# Patient Record
Sex: Female | Born: 1970 | Race: Black or African American | Hispanic: No | Marital: Married | Smoking: Never smoker
Health system: Southern US, Community
[De-identification: ages and names within clinical notes are randomized; demographics above are authoritative.]

## PROBLEM LIST (undated history)

## (undated) DIAGNOSIS — K219 Gastro-esophageal reflux disease without esophagitis: Secondary | ICD-10-CM

## (undated) DIAGNOSIS — D759 Disease of blood and blood-forming organs, unspecified: Secondary | ICD-10-CM

## (undated) DIAGNOSIS — J45909 Unspecified asthma, uncomplicated: Secondary | ICD-10-CM

## (undated) DIAGNOSIS — R718 Other abnormality of red blood cells: Secondary | ICD-10-CM

## (undated) DIAGNOSIS — I959 Hypotension, unspecified: Secondary | ICD-10-CM

## (undated) DIAGNOSIS — R911 Solitary pulmonary nodule: Secondary | ICD-10-CM

## (undated) DIAGNOSIS — K8020 Calculus of gallbladder without cholecystitis without obstruction: Secondary | ICD-10-CM

## (undated) DIAGNOSIS — E669 Obesity, unspecified: Secondary | ICD-10-CM

## (undated) DIAGNOSIS — N83202 Unspecified ovarian cyst, left side: Secondary | ICD-10-CM

## (undated) DIAGNOSIS — G43829 Menstrual migraine, not intractable, without status migrainosus: Secondary | ICD-10-CM

## (undated) DIAGNOSIS — J40 Bronchitis, not specified as acute or chronic: Secondary | ICD-10-CM

## (undated) DIAGNOSIS — E66812 Obesity, class 2: Secondary | ICD-10-CM

## (undated) DIAGNOSIS — I2542 Coronary artery dissection: Secondary | ICD-10-CM

## (undated) DIAGNOSIS — I219 Acute myocardial infarction, unspecified: Secondary | ICD-10-CM

## (undated) DIAGNOSIS — Z8619 Personal history of other infectious and parasitic diseases: Secondary | ICD-10-CM

## (undated) DIAGNOSIS — N943 Premenstrual tension syndrome: Secondary | ICD-10-CM

## (undated) DIAGNOSIS — B373 Candidiasis of vulva and vagina: Secondary | ICD-10-CM

## (undated) DIAGNOSIS — D649 Anemia, unspecified: Secondary | ICD-10-CM

## (undated) DIAGNOSIS — I2089 Other forms of angina pectoris: Secondary | ICD-10-CM

## (undated) DIAGNOSIS — I255 Ischemic cardiomyopathy: Secondary | ICD-10-CM

## (undated) DIAGNOSIS — F419 Anxiety disorder, unspecified: Secondary | ICD-10-CM

## (undated) DIAGNOSIS — M5020 Other cervical disc displacement, unspecified cervical region: Secondary | ICD-10-CM

## (undated) DIAGNOSIS — I493 Ventricular premature depolarization: Secondary | ICD-10-CM

## (undated) DIAGNOSIS — Z973 Presence of spectacles and contact lenses: Secondary | ICD-10-CM

## (undated) DIAGNOSIS — G709 Myoneural disorder, unspecified: Secondary | ICD-10-CM

## (undated) HISTORY — DX: Obesity, class 2: E66.812

## (undated) HISTORY — DX: Obesity, unspecified: E66.9

## (undated) HISTORY — DX: Premenstrual tension syndrome: N94.3

## (undated) HISTORY — DX: Candidiasis of vulva and vagina: B37.3

## (undated) HISTORY — DX: Unspecified ovarian cyst, left side: N83.202

## (undated) HISTORY — DX: Calculus of gallbladder without cholecystitis without obstruction: K80.20

## (undated) HISTORY — DX: Ischemic cardiomyopathy: I25.5

## (undated) HISTORY — DX: Hypotension, unspecified: I95.9

## (undated) HISTORY — PX: WISDOM TOOTH EXTRACTION: SHX21

## (undated) HISTORY — DX: Menstrual migraine, not intractable, without status migrainosus: G43.829

## (undated) HISTORY — DX: Other cervical disc displacement, unspecified cervical region: M50.20

## (undated) HISTORY — DX: Gastro-esophageal reflux disease without esophagitis: K21.9

## (undated) HISTORY — DX: Myoneural disorder, unspecified: G70.9

## (undated) HISTORY — DX: Other abnormality of red blood cells: R71.8

## (undated) HISTORY — DX: Personal history of other infectious and parasitic diseases: Z86.19

## (undated) HISTORY — DX: Ventricular premature depolarization: I49.3

## (undated) HISTORY — DX: Unspecified asthma, uncomplicated: J45.909

## (undated) HISTORY — DX: Anxiety disorder, unspecified: F41.9

---

## 1998-07-19 ENCOUNTER — Inpatient Hospital Stay (HOSPITAL_COMMUNITY): Admission: AD | Admit: 1998-07-19 | Discharge: 1998-07-21 | Payer: Self-pay | Admitting: Obstetrics and Gynecology

## 1998-09-13 ENCOUNTER — Other Ambulatory Visit: Admission: RE | Admit: 1998-09-13 | Discharge: 1998-09-13 | Payer: Self-pay | Admitting: Obstetrics and Gynecology

## 1999-10-16 ENCOUNTER — Other Ambulatory Visit: Admission: RE | Admit: 1999-10-16 | Discharge: 1999-10-16 | Payer: Self-pay | Admitting: Obstetrics and Gynecology

## 2000-11-09 ENCOUNTER — Other Ambulatory Visit: Admission: RE | Admit: 2000-11-09 | Discharge: 2000-11-09 | Payer: Self-pay | Admitting: Obstetrics and Gynecology

## 2001-12-16 ENCOUNTER — Other Ambulatory Visit: Admission: RE | Admit: 2001-12-16 | Discharge: 2001-12-16 | Payer: Self-pay | Admitting: Obstetrics and Gynecology

## 2003-01-09 ENCOUNTER — Other Ambulatory Visit: Admission: RE | Admit: 2003-01-09 | Discharge: 2003-01-09 | Payer: Self-pay | Admitting: Obstetrics and Gynecology

## 2003-02-17 DIAGNOSIS — B373 Candidiasis of vulva and vagina: Secondary | ICD-10-CM

## 2003-02-17 DIAGNOSIS — B3731 Acute candidiasis of vulva and vagina: Secondary | ICD-10-CM

## 2003-02-17 HISTORY — DX: Candidiasis of vulva and vagina: B37.3

## 2003-02-17 HISTORY — DX: Acute candidiasis of vulva and vagina: B37.31

## 2004-01-09 ENCOUNTER — Other Ambulatory Visit: Admission: RE | Admit: 2004-01-09 | Discharge: 2004-01-09 | Payer: Self-pay | Admitting: Obstetrics and Gynecology

## 2004-01-14 ENCOUNTER — Other Ambulatory Visit: Admission: RE | Admit: 2004-01-14 | Discharge: 2004-01-14 | Payer: Self-pay | Admitting: Obstetrics and Gynecology

## 2005-03-03 ENCOUNTER — Other Ambulatory Visit: Admission: RE | Admit: 2005-03-03 | Discharge: 2005-03-03 | Payer: Self-pay | Admitting: Obstetrics and Gynecology

## 2010-02-16 DIAGNOSIS — N83202 Unspecified ovarian cyst, left side: Secondary | ICD-10-CM

## 2010-02-16 HISTORY — DX: Unspecified ovarian cyst, left side: N83.202

## 2011-01-28 ENCOUNTER — Encounter: Payer: Self-pay | Admitting: Physician Assistant

## 2011-02-02 ENCOUNTER — Other Ambulatory Visit: Payer: Self-pay | Admitting: Obstetrics and Gynecology

## 2011-02-04 ENCOUNTER — Encounter (INDEPENDENT_AMBULATORY_CARE_PROVIDER_SITE_OTHER): Payer: BC Managed Care – PPO | Admitting: Physician Assistant

## 2011-02-04 DIAGNOSIS — Z111 Encounter for screening for respiratory tuberculosis: Secondary | ICD-10-CM

## 2011-02-04 DIAGNOSIS — Z0000 Encounter for general adult medical examination without abnormal findings: Secondary | ICD-10-CM

## 2011-02-04 DIAGNOSIS — Z23 Encounter for immunization: Secondary | ICD-10-CM

## 2011-02-05 ENCOUNTER — Encounter (HOSPITAL_COMMUNITY): Payer: Self-pay | Admitting: Pharmacist

## 2011-02-09 NOTE — H&P (Signed)
NAME:  [name], [name]                   ACCOUNT NO.:  MEDICAL RECORD NO.:  [id number]  LOCATION:                                 FACILITY:  PHYSICIAN:  Dois Davenport A. Rivard, M.D. DATE OF BIRTH:  1970-08-08  DATE OF ADMISSION: DATE OF DISCHARGE:                             HISTORY & PHYSICAL   HISTORY OF PRESENT ILLNESS:  [name] is a 40 year old, married black female, para 2-0-1-2 presenting for a robot-assisted left ovarian cystectomy and bilateral laparoscopic tubal cautery because of a complex  left ovarian cyst and a desire for sterilization.   During an evaluation for pelvic pain in December 2012, the patient was  found on ultrasound to have a septated left ovarian cyst measuring 7.0 x 4.6 x 5.9 cm along with the uterus measuring 9.88 x 4.6 x 4.7 cm with normal ovaries.  The patient's CEA, CA-125 and comprehensive metabolic panel were all normal.  Two weeks prior to being seen, the patient reported that she'd had an alternating dull, sharp and crampy lower back pain that did not diminish with over-the-counter analgesia. Over time this  pain began to encompass her entire pelvis and she rated it as a 10/10 on a 10-point pain scale. Finally, with Ibuprofen 800 mg she was able to decrease her pain to almost 0.  For the past several days, the patient's  Intense pain localized to her left lower quadrant, though she continued to have discomfort in the remainder of her pelvis and her lower back.  The patient has monthly menstrual periods requiring a panty liner during her 4-day flow and only experiences minimal cramping.  Since this current "pelvic pain" episode, she has had some pain with intercourse along with discomfort after urination, particularly when her bladder is overfilled.  She has also noticed constipation that has required her to use a laxative every 5 days since she has had this pelvic issue.  She denies any vaginitis symptoms, indigestion, hematuria, or fever.  Additionally  the patient desires to pursue permanent sterilization as she has completed her childbearing.  She used oral contraceptives in the past and currently uses a Mirena IUD for both contraception and menstrual flow management.  Given the findings on ultrasound, her pelvic discomfort and her desire for permanent sterilization, the patient has decided to proceed with robot-assisted left ovarian cystectomy and laparoscopic tubal cautery.  PAST MEDICAL HISTORY:  OB History:  Gravida 3, para 2-0-1-2.  The patient had 2 spontaneous vaginal deliveries.  GYN history:  Menarche 40 years old.  The patient has very scant menstrual period due to the Mirena IUD.  Denies any history of sexually transmitted diseases or abnormal Pap smears.  Last normal Pap smear was June 2012.  Medical History:  Menstrual migraines, PMS, acid reflux disease.  Surgical History:  Negative.  FAMILY HISTORY:  Diabetes, hypertension.  SOCIAL HISTORY:  The patient is married and she works as a Child psychotherapist.  HABITS:  She denies any alcohol, tobacco, or illicit drug use.  CURRENT MEDICATIONS: 1. Multivitamins daily. 2. Ibuprofen 600 mg every 6 hours with food as needed. 3. Zantac and MiraLAX as needed.  The patient had a flu shot  and tetanus shot in February 04, 2011.  She has no known drug allergies.  Denies any sensitivities to latex, peanuts, shellfish, or soy.  REVIEW OF SYSTEMS:  The patient wears contact lenses.  She Occasionally has heartburn and headaches with her migraines.  She  denies any chest pain, shortness of breath, vision changes, dysphagia, vomiting, diarrhea, myalgias, or arthralgias and except as is mentioned in history of present illness her review of systems is negative.  PHYSICAL EXAMINATION:  VITAL SIGNS:  Blood pressure 112/72, pulse is 80, respirations 16, temperature 98.1 degrees Fahrenheit orally, weight 149 pounds, height 5 feet10 inches tall. NECK:  Supple without masses.  There is no  thyromegaly or cervical adenopathy. HEART:  Regular rate and rhythm. LUNGS:  Clear. BACK:  No CVA tenderness. ABDOMEN:  Soft without any palpable masses.  There is left lower quadrant greater than right lower quadrant tenderness without guarding or rebound or organomegaly. EXTREMITIES:  No clubbing, cyanosis, or edema. PELVIC:  EGBUS is normal.  Vagina is normal.  Cervix is nontender without lesions.  Uterus appears normal size, shape, and consistency without tenderness.  Adnexa with an enlarged left ovary palpable in the anterior cul-de-sac that is mobile and smooth.  IMPRESSION: 1. Left ovarian cyst. 2. Desire for sterilization.  DISPOSITION:  A discussion was held with the patient regarding indications for her procedure along with its risks which include but are not limited to reaction to anesthesia, damage to adjacent organs, infection, excessive bleeding, and the possibility of oophorectomy.  The patient also was advised that laparoscopic tubal cautery has a failure rate of 1 per 500.  The patient was further advised that she will experience transient postoperative facial edema and that 24 hours prior to her procedure she should perform the MiraLAX bowel prep instructions that were given to her.  The patient verbalized understanding of these risks and instructions and has consented to proceed with a robot- assisted ovarian cystectomy with laparoscopic tubal cautery at [facility] [place] on February 19, 2010, at 1:30 p.m.    Orlan Aversa J. Lowell Guitar, P.A.-C   ______________________________ Crist Fat Rivard, M.D.   EJP/MEDQ  D:  02/06/2011  T:  02/07/2011  Job:  [id number]

## 2011-02-12 NOTE — Patient Instructions (Addendum)
   Your procedure is scheduled on: Friday 1/4 at 1:30pm  Enter through the Main Entrance of [facility] at: 12:00pm Pick up the phone at the desk and dial [phone] and inform us of your arrival.  Please call this number if you have any problems the morning of surgery: [phone]  Remember: Do not eat food after midnight: Thursday  Do not drink clear liquids after: 8:00 am on Friday  Do not wear jewelry, make-up, or FINGER nail polish Do not wear lotions, powders, perfumes or deodorant. Do not shave 48 hours prior to surgery. Do not bring valuables to the hospital.  Remember to use your hibiclens as instructed.Please shower with 1/2 bottle the evening before your surgery and the other 1/2 bottle the morning of surgery.

## 2011-02-13 ENCOUNTER — Encounter (HOSPITAL_COMMUNITY)
Admission: RE | Admit: 2011-02-13 | Discharge: 2011-02-13 | Disposition: A | Discharge status: Home / Self Care | Payer: BC Managed Care – PPO | Source: Ambulatory Visit | Attending: Obstetrics and Gynecology | Admitting: Obstetrics and Gynecology

## 2011-02-13 ENCOUNTER — Encounter (HOSPITAL_COMMUNITY): Payer: Self-pay

## 2011-02-13 LAB — CBC
Hemoglobin: 14.4 g/dL (ref 12.0–15.0)
MCH: 27 pg (ref 26.0–34.0)
Platelets: 254 10*3/uL (ref 150–400)
RBC: 5.33 MIL/uL — ABNORMAL HIGH (ref 3.87–5.11)
WBC: 5.8 10*3/uL (ref 4.0–10.5)

## 2011-02-13 LAB — SURGICAL PCR SCREEN: MRSA, PCR: NEGATIVE

## 2011-02-17 HISTORY — PX: ROBOTIC ASSISTED LAPAROSCOPIC OVARIAN CYSTECTOMY: SHX6081

## 2011-02-19 MED ORDER — DEXTROSE 5 % IV SOLN
1.0000 g | INTRAVENOUS | Status: AC
  Administered 2011-02-20: 1 g via INTRAVENOUS
  Filled 2011-02-19: qty 1

## 2011-02-20 ENCOUNTER — Other Ambulatory Visit: Payer: Self-pay | Admitting: Obstetrics and Gynecology

## 2011-02-20 ENCOUNTER — Encounter (HOSPITAL_COMMUNITY): Payer: Self-pay | Admitting: *Deleted

## 2011-02-20 ENCOUNTER — Ambulatory Visit (HOSPITAL_COMMUNITY)
Admission: RE | Admit: 2011-02-20 | Discharge: 2011-02-20 | Disposition: A | Discharge status: Home / Self Care | Payer: BC Managed Care – PPO | Source: Ambulatory Visit | Attending: Obstetrics and Gynecology | Admitting: Obstetrics and Gynecology

## 2011-02-20 ENCOUNTER — Encounter (HOSPITAL_COMMUNITY)
Admission: RE | Disposition: A | Discharge status: Home / Self Care | Payer: Self-pay | Source: Ambulatory Visit | Attending: Obstetrics and Gynecology

## 2011-02-20 ENCOUNTER — Ambulatory Visit (HOSPITAL_COMMUNITY): Payer: BC Managed Care – PPO | Admitting: Anesthesiology

## 2011-02-20 ENCOUNTER — Encounter (HOSPITAL_COMMUNITY): Payer: Self-pay | Admitting: Anesthesiology

## 2011-02-20 DIAGNOSIS — Z01818 Encounter for other preprocedural examination: Secondary | ICD-10-CM | POA: Insufficient documentation

## 2011-02-20 DIAGNOSIS — N83202 Unspecified ovarian cyst, left side: Secondary | ICD-10-CM

## 2011-02-20 DIAGNOSIS — Z302 Encounter for sterilization: Secondary | ICD-10-CM | POA: Insufficient documentation

## 2011-02-20 DIAGNOSIS — Z01812 Encounter for preprocedural laboratory examination: Secondary | ICD-10-CM | POA: Insufficient documentation

## 2011-02-20 DIAGNOSIS — N8300 Follicular cyst of ovary, unspecified side: Secondary | ICD-10-CM | POA: Insufficient documentation

## 2011-02-20 DIAGNOSIS — Z30432 Encounter for removal of intrauterine contraceptive device: Secondary | ICD-10-CM | POA: Insufficient documentation

## 2011-02-20 HISTORY — PX: TUBAL LIGATION: SHX77

## 2011-02-20 HISTORY — PX: LAPAROSCOPIC TUBAL LIGATION: SHX1937

## 2011-02-20 LAB — PREGNANCY, URINE: Preg Test, Ur: NEGATIVE

## 2011-02-20 SURGERY — ROBOTIC ASSISTED LAPAROSCOPIC OVARIAN CYSTECTOMY
Anesthesia: General | Site: Abdomen | Laterality: Left | Wound class: Clean Contaminated

## 2011-02-20 MED ORDER — KETOROLAC TROMETHAMINE 30 MG/ML IJ SOLN
INTRAMUSCULAR | Status: AC
  Filled 2011-02-20: qty 1

## 2011-02-20 MED ORDER — HEPARIN SODIUM (PORCINE) 5000 UNIT/ML IJ SOLN
INTRAMUSCULAR | Status: DC | PRN
  Administered 2011-02-20: 5000 [IU]

## 2011-02-20 MED ORDER — OXYCODONE-ACETAMINOPHEN 5-325 MG PO TABS
1.0000 | ORAL_TABLET | ORAL | Status: DC | PRN
  Administered 2011-02-20: 1 via ORAL

## 2011-02-20 MED ORDER — FENTANYL CITRATE 0.05 MG/ML IJ SOLN
INTRAMUSCULAR | Status: DC | PRN
  Administered 2011-02-20 (×4): 50 ug via INTRAVENOUS

## 2011-02-20 MED ORDER — KETOROLAC TROMETHAMINE 30 MG/ML IJ SOLN
INTRAMUSCULAR | Status: DC | PRN
  Administered 2011-02-20: 30 mg via INTRAVENOUS
  Administered 2011-02-20: 30 mg via INTRAMUSCULAR

## 2011-02-20 MED ORDER — ROCURONIUM BROMIDE 100 MG/10ML IV SOLN
INTRAVENOUS | Status: DC | PRN
  Administered 2011-02-20: 50 mg via INTRAVENOUS

## 2011-02-20 MED ORDER — GLYCOPYRROLATE 0.2 MG/ML IJ SOLN
INTRAMUSCULAR | Status: AC
  Filled 2011-02-20: qty 1

## 2011-02-20 MED ORDER — BUPIVACAINE HCL (PF) 0.25 % IJ SOLN
INTRAMUSCULAR | Status: DC | PRN
  Administered 2011-02-20: 13 mL

## 2011-02-20 MED ORDER — ROCURONIUM BROMIDE 50 MG/5ML IV SOLN
INTRAVENOUS | Status: AC
  Filled 2011-02-20: qty 1

## 2011-02-20 MED ORDER — FENTANYL CITRATE 0.05 MG/ML IJ SOLN
INTRAMUSCULAR | Status: AC
  Administered 2011-02-20: 25 ug via INTRAVENOUS
  Filled 2011-02-20: qty 2

## 2011-02-20 MED ORDER — OXYCODONE-ACETAMINOPHEN 5-325 MG PO TABS
ORAL_TABLET | ORAL | Status: AC
  Filled 2011-02-20: qty 1

## 2011-02-20 MED ORDER — PROPOFOL 10 MG/ML IV EMUL
INTRAVENOUS | Status: AC
  Filled 2011-02-20: qty 20

## 2011-02-20 MED ORDER — KETOROLAC TROMETHAMINE 60 MG/2ML IM SOLN
INTRAMUSCULAR | Status: AC
  Filled 2011-02-20: qty 2

## 2011-02-20 MED ORDER — GLYCOPYRROLATE 0.2 MG/ML IJ SOLN
INTRAMUSCULAR | Status: DC | PRN
  Administered 2011-02-20: .4 mg via INTRAVENOUS

## 2011-02-20 MED ORDER — PROPOFOL 10 MG/ML IV EMUL
INTRAVENOUS | Status: DC | PRN
  Administered 2011-02-20: 160 mg via INTRAVENOUS

## 2011-02-20 MED ORDER — ONDANSETRON HCL 4 MG/2ML IJ SOLN
INTRAMUSCULAR | Status: DC | PRN
  Administered 2011-02-20: 4 mg via INTRAVENOUS

## 2011-02-20 MED ORDER — DEXAMETHASONE SODIUM PHOSPHATE 4 MG/ML IJ SOLN
INTRAMUSCULAR | Status: DC | PRN
  Administered 2011-02-20: 5 mg via INTRAVENOUS

## 2011-02-20 MED ORDER — LIDOCAINE HCL (CARDIAC) 20 MG/ML IV SOLN
INTRAVENOUS | Status: DC | PRN
  Administered 2011-02-20: 80 mg via INTRAVENOUS

## 2011-02-20 MED ORDER — NEOSTIGMINE METHYLSULFATE 1 MG/ML IJ SOLN
INTRAMUSCULAR | Status: DC | PRN
  Administered 2011-02-20: 3 mg via INTRAVENOUS

## 2011-02-20 MED ORDER — FENTANYL CITRATE 0.05 MG/ML IJ SOLN
25.0000 ug | INTRAMUSCULAR | Status: DC | PRN
  Administered 2011-02-20: 25 ug via INTRAVENOUS

## 2011-02-20 MED ORDER — OXYCODONE-ACETAMINOPHEN 5-325 MG PO TABS: 1.0000 | ORAL_TABLET | Freq: Four times a day (QID) | ORAL | Status: DC | PRN

## 2011-02-20 MED ORDER — PROMETHAZINE HCL 12.5 MG PO TABS: 12.5000 mg | ORAL_TABLET | Freq: Four times a day (QID) | ORAL | Status: AC | PRN

## 2011-02-20 MED ORDER — NEOSTIGMINE METHYLSULFATE 1 MG/ML IJ SOLN
INTRAMUSCULAR | Status: AC
  Filled 2011-02-20: qty 10

## 2011-02-20 MED ORDER — ONDANSETRON HCL 4 MG/2ML IJ SOLN
INTRAMUSCULAR | Status: AC
  Filled 2011-02-20: qty 2

## 2011-02-20 MED ORDER — LIDOCAINE HCL (CARDIAC) 20 MG/ML IV SOLN
INTRAVENOUS | Status: AC
  Filled 2011-02-20: qty 5

## 2011-02-20 MED ORDER — LACTATED RINGERS IR SOLN
Status: DC | PRN
  Administered 2011-02-20: 3000 mL

## 2011-02-20 MED ORDER — MIDAZOLAM HCL 2 MG/2ML IJ SOLN
INTRAMUSCULAR | Status: AC
  Filled 2011-02-20: qty 2

## 2011-02-20 MED ORDER — MIDAZOLAM HCL 5 MG/5ML IJ SOLN
INTRAMUSCULAR | Status: DC | PRN
  Administered 2011-02-20: 2 mg via INTRAVENOUS

## 2011-02-20 MED ORDER — LACTATED RINGERS IV SOLN
INTRAVENOUS | Status: DC
  Administered 2011-02-20: 13:00:00 via INTRAVENOUS

## 2011-02-20 MED ORDER — KETOROLAC TROMETHAMINE 30 MG/ML IJ SOLN: 15.0000 mg | Freq: Once | INTRAMUSCULAR | Status: DC | PRN

## 2011-02-20 MED ORDER — IBUPROFEN 600 MG PO TABS: 600.0000 mg | ORAL_TABLET | Freq: Four times a day (QID) | ORAL | Status: AC

## 2011-02-20 MED ORDER — DEXAMETHASONE SODIUM PHOSPHATE 10 MG/ML IJ SOLN
INTRAMUSCULAR | Status: AC
  Filled 2011-02-20: qty 1

## 2011-02-20 MED ORDER — FENTANYL CITRATE 0.05 MG/ML IJ SOLN
INTRAMUSCULAR | Status: AC
  Filled 2011-02-20: qty 5

## 2011-02-20 SURGICAL SUPPLY — 81 items
ADH SKN CLS APL DERMABOND .7 (GAUZE/BANDAGES/DRESSINGS) ×2
APL SKNCLS STERI-STRIP NONHPOA (GAUZE/BANDAGES/DRESSINGS) ×2
BAG URINE DRAINAGE (UROLOGICAL SUPPLIES) ×5 IMPLANT
BARRIER ADHS 3X4 INTERCEED (GAUZE/BANDAGES/DRESSINGS) ×3 IMPLANT
BENZOIN TINCTURE PRP APPL 2/3 (GAUZE/BANDAGES/DRESSINGS) ×3 IMPLANT
BLADELESS LONG 8MM (BLADE) ×3 IMPLANT
BRR ADH 4X3 ABS CNTRL BYND (GAUZE/BANDAGES/DRESSINGS) ×2
CABLE HIGH FREQUENCY MONO STRZ (ELECTRODE) ×3 IMPLANT
CATH FOLEY 3WAY  5CC 16FR (CATHETERS) ×1
CATH FOLEY 3WAY  5CC 18FR (CATHETERS) ×1
CATH FOLEY 3WAY 5CC 16FR (CATHETERS) ×4 IMPLANT
CATH FOLEY 3WAY 5CC 18FR (CATHETERS) ×2 IMPLANT
CATH ROBINSON RED A/P 16FR (CATHETERS) ×3 IMPLANT
CHLORAPREP W/TINT 26ML (MISCELLANEOUS) ×3 IMPLANT
CLOTH BEACON ORANGE TIMEOUT ST (SAFETY) ×3 IMPLANT
CONT PATH 16OZ SNAP LID 3702 (MISCELLANEOUS) ×3 IMPLANT
CORDS BIPOLAR (ELECTRODE) IMPLANT
COVER MAYO STAND STRL (DRAPES) ×3 IMPLANT
COVER TABLE BACK 60X90 (DRAPES) ×6 IMPLANT
COVER TIP SHEARS 8 DVNC (MISCELLANEOUS) ×2 IMPLANT
COVER TIP SHEARS 8MM DA VINCI (MISCELLANEOUS) ×1
DECANTER SPIKE VIAL GLASS SM (MISCELLANEOUS) ×3 IMPLANT
DERMABOND ADVANCED (GAUZE/BANDAGES/DRESSINGS) ×1
DERMABOND ADVANCED .7 DNX12 (GAUZE/BANDAGES/DRESSINGS) ×2 IMPLANT
DRAPE HUG U DISPOSABLE (DRAPE) ×3 IMPLANT
DRAPE LG THREE QUARTER DISP (DRAPES) ×6 IMPLANT
DRAPE MONITOR DA VINCI (DRAPE) ×1 IMPLANT
DRAPE WARM FLUID 44X44 (DRAPE) ×3 IMPLANT
ELECT REM PT RETURN 9FT ADLT (ELECTROSURGICAL) ×3
ELECTRODE REM PT RTRN 9FT ADLT (ELECTROSURGICAL) ×2 IMPLANT
EVACUATOR SMOKE 8.L (FILTER) ×3 IMPLANT
GAUZE VASELINE 3X9 (GAUZE/BANDAGES/DRESSINGS) IMPLANT
GLOVE BIOGEL PI IND STRL 7.0 (GLOVE) ×6 IMPLANT
GLOVE BIOGEL PI INDICATOR 7.0 (GLOVE) ×3
GLOVE ECLIPSE 6.5 STRL STRAW (GLOVE) ×9 IMPLANT
GOWN PREVENTION PLUS LG XLONG (DISPOSABLE) ×16 IMPLANT
GRASPER BIPOLAR FEN DA VINCI (INSTRUMENTS)
GRASPER BPLR FEN DVNC (INSTRUMENTS) IMPLANT
KIT ACCESSORY DA VINCI DISP (KITS) ×1
KIT ACCESSORY DVNC DISP (KITS) ×2 IMPLANT
KIT DISP ACCESSORY 4 ARM (KITS) ×1 IMPLANT
MANIPULATOR UTERINE 4.5 ZUMI (MISCELLANEOUS) ×1 IMPLANT
NDL SPNL 22GX7 QUINCKE BK (NEEDLE) IMPLANT
NEEDLE SPNL 22GX7 QUINCKE BK (NEEDLE) IMPLANT
NS IRRIG 1000ML POUR BTL (IV SOLUTION) ×9 IMPLANT
OCCLUDER COLPOPNEUMO (BALLOONS) IMPLANT
PACK LAPAROSCOPY BASIN (CUSTOM PROCEDURE TRAY) ×2 IMPLANT
PACK LAVH (CUSTOM PROCEDURE TRAY) ×3 IMPLANT
PAD PREP 24X48 CUFFED NSTRL (MISCELLANEOUS) ×6 IMPLANT
PLUG CATH AND CAP STER (CATHETERS) ×3 IMPLANT
POSITIONER SURGICAL ARM (MISCELLANEOUS) ×6 IMPLANT
SET CYSTO W/LG BORE CLAMP LF (SET/KITS/TRAYS/PACK) IMPLANT
SET IRRIG TUBING LAPAROSCOPIC (IRRIGATION / IRRIGATOR) ×3 IMPLANT
SLEEVE SURGEON STRL (DRAPES) ×1 IMPLANT
SOLUTION ELECTROLUBE (MISCELLANEOUS) ×3 IMPLANT
SPONGE LAP 18X18 X RAY DECT (DISPOSABLE) IMPLANT
STRIP CLOSURE SKIN 1/2X4 (GAUZE/BANDAGES/DRESSINGS) ×2 IMPLANT
SUT MNCRL AB 3-0 PS2 27 (SUTURE) ×3 IMPLANT
SUT VIC AB 0 CT1 27 (SUTURE) ×18
SUT VIC AB 0 CT1 27XBRD ANTBC (SUTURE) ×12 IMPLANT
SUT VIC AB 0 CT2 27 (SUTURE) IMPLANT
SUT VIC AB 2-0 CT2 27 (SUTURE) ×3 IMPLANT
SUT VIC AB 3-0 SH 27 (SUTURE) ×3
SUT VIC AB 3-0 SH 27X BRD (SUTURE) ×2 IMPLANT
SUT VICRYL 0 UR6 27IN ABS (SUTURE) ×6 IMPLANT
SYR 50ML LL SCALE MARK (SYRINGE) ×3 IMPLANT
SYSTEM CONVERTIBLE TROCAR (TROCAR) ×3 IMPLANT
TIP UTERINE 5.1X6CM LAV DISP (MISCELLANEOUS) IMPLANT
TIP UTERINE 6.7X10CM GRN DISP (MISCELLANEOUS) IMPLANT
TIP UTERINE 6.7X6CM WHT DISP (MISCELLANEOUS) IMPLANT
TIP UTERINE 6.7X8CM BLUE DISP (MISCELLANEOUS) IMPLANT
TOWEL OR 17X24 6PK STRL BLUE (TOWEL DISPOSABLE) ×9 IMPLANT
TROCAR 12M 150ML BLUNT (TROCAR) ×2 IMPLANT
TROCAR BALLN 12MMX100 BLUNT (TROCAR) ×1 IMPLANT
TROCAR DISP BLADELESS 8 DVNC (TROCAR) ×2 IMPLANT
TROCAR DISP BLADELESS 8MM (TROCAR)
TROCAR XCEL 12X100 BLDLESS (ENDOMECHANICALS) ×2 IMPLANT
TROCAR Z-THREAD BLADED 12X100M (TROCAR) IMPLANT
TUBING FILTER THERMOFLATOR (ELECTROSURGICAL) ×3 IMPLANT
WARMER LAPAROSCOPE (MISCELLANEOUS) ×3 IMPLANT
WATER STERILE IRR 1000ML POUR (IV SOLUTION) ×7 IMPLANT

## 2011-02-20 NOTE — Brief Op Note (Signed)
02/20/2011  4:13 PM  PATIENT:  [name]  41 y.o. female  PRE-OPERATIVE DIAGNOSIS:  Left Ovarian Cyst; Desire for Sterilization  POST-OPERATIVE DIAGNOSIS:  Left Ovarian Cyst; Desire for Sterilization, Pelvic adhesions    Procedure(s): ROBOTIC ASSISTED LAPAROSCOPIC OVARIAN CYSTECTOMY LAPAROSCOPIC TUBAL LIGATION    Surgeon(s): Esmeralda Arthur, MD   ASSISTANTS: Henreitta Leber PA-C   ANESTHESIA:   local and general  LOCAL MEDICATIONS USED:  MARCAINE 10 CC  SPECIMEN:  ovarian cyst walls and pelvic washings  DISPOSITION OF SPECIMEN:  PATHOLOGY  COUNTS:  YES  ESTIMATED BLOOD LOSS: * No blood loss amount entered *   PATIENT DISPOSITION:  PACU - hemodynamically stable.   DICTATION #: [id number]    Silverio Lay MD 02/20/2011 4:13 PM

## 2011-02-20 NOTE — Transfer of Care (Signed)
Immediate Anesthesia Transfer of Care Note  [name]  Procedure(s) Performed:  ROBOTIC ASSISTED LAPAROSCOPIC OVARIAN CYSTECTOMY; LAPAROSCOPIC TUBAL LIGATION - with Removal of Mirena and Robotic Assisted Bilateral Tubal Ligation  Patient Location: PACU  Anesthesia Type: General  Level of Consciousness: awake, alert , oriented and patient cooperative  Airway & Oxygen Therapy: Patient Spontanous Breathing and Patient connected to nasal cannula oxygen  Post-op Assessment: Report given to PACU RN  Post vital signs: stable  Complications: No apparent anesthesia complications

## 2011-02-20 NOTE — Anesthesia Procedure Notes (Signed)
Procedures

## 2011-02-20 NOTE — Anesthesia Preprocedure Evaluation (Signed)
Anesthesia Evaluation  Patient identified by MRN, date of birth, ID band Patient awake    Reviewed: Allergy & Precautions, H&P , NPO status , Patient's Chart, lab work & pertinent test results, reviewed documented beta blocker date and time   History of Anesthesia Complications Negative for: history of anesthetic complications  Airway Mallampati: I TM Distance: >3 FB Neck ROM: full    Dental  (+) Teeth Intact   Pulmonary neg pulmonary ROS,  clear to auscultation  Pulmonary exam normal       Cardiovascular Exercise Tolerance: Good neg cardio ROS regular Normal    Neuro/Psych Negative Neurological ROS  Negative Psych ROS   GI/Hepatic negative GI ROS, Neg liver ROS,   Endo/Other  Negative Endocrine ROS  Renal/GU negative Renal ROS  Genitourinary negative   Musculoskeletal   Abdominal   Peds  Hematology negative hematology ROS (+)   Anesthesia Other Findings   Reproductive/Obstetrics negative OB ROS                           Anesthesia Physical Anesthesia Plan  ASA: I  Anesthesia Plan: General ETT   Post-op Pain Management:    Induction:   Airway Management Planned:   Additional Equipment:   Intra-op Plan:   Post-operative Plan:   Informed Consent: I have reviewed the patients History and Physical, chart, labs and discussed the procedure including the risks, benefits and alternatives for the proposed anesthesia with the patient or authorized representative who has indicated his/her understanding and acceptance.   Dental Advisory Given  Plan Discussed with: CRNA and Surgeon  Anesthesia Plan Comments:         Anesthesia Quick Evaluation

## 2011-02-20 NOTE — Anesthesia Postprocedure Evaluation (Signed)
Anesthesia Post Note  [name]  Procedure(s) Performed:  ROBOTIC ASSISTED LAPAROSCOPIC OVARIAN CYSTECTOMY; LAPAROSCOPIC TUBAL LIGATION - with Removal of Mirena and Robotic Assisted Bilateral Tubal Ligation  Anesthesia type: General  Patient location: PACU  Post pain: Pain level controlled  Post assessment: Post-op Vital signs reviewed  Last Vitals:  Filed Vitals:   02/20/11 1730  BP: 112/57  Pulse: 80  Temp: 37.1 C  Resp: 16    Post vital signs: Reviewed  Level of consciousness: sedated  Complications: No apparent anesthesia complications

## 2011-02-20 NOTE — Interval H&P Note (Signed)
History and Physical Interval Note:  02/20/2011 1:17 PM  [name]  has presented today for surgery, with the diagnosis of Left Ovarian Cyst; Desire for Sterilization  The various methods of treatment have been discussed with the patient and family. After consideration of risks, benefits and other options for treatment, the patient has consented to  Procedure(s): ROBOTIC ASSISTED LAPAROSCOPIC OVARIAN CYSTECTOMY LAPAROSCOPIC TUBAL LIGATION as a surgical intervention .  The patients' history has been reviewed, patient examined, no change in status, stable for surgery.  I have reviewed the patients' chart and labs.  Questions were answered to the patient's satisfaction.     Darion Milewski A

## 2011-02-20 NOTE — Brief Op Note (Deleted)
02/20/2011  3:54 PM  PATIENT:  [name]  41 y.o. female  PRE-OPERATIVE DIAGNOSIS:  Left Ovarian Cyst; Desire for Sterilization  POST-OPERATIVE DIAGNOSIS:  Left Ovarian Cyst; Desire for Sterilization    Procedure(s): ROBOTIC ASSISTED LAPAROSCOPIC OVARIAN CYSTECTOMY LAPAROSCOPIC TUBAL LIGATION    Surgeon(s): Esmeralda Arthur, MD MD  ASSISTANTS: Henreitta Leber PA-C   ANESTHESIA:   local and general  LOCAL MEDICATIONS USED:  MARCAINE 20 CC  SPECIMEN:  Left ovarian cyst wall x 2  DISPOSITION OF SPECIMEN:  PATHOLOGY  COUNTS:  YES  ESTIMATED BLOOD LOSS: 10 CC Urine Output: 300 CC IV Fluids: 900 CC  PATIENT DISPOSITION:  PACU - hemodynamically stable.   DICTATION #:    Caspian Deleonardis J. Lowell Guitar, PA-C 02/20/2011 3:54 PM

## 2011-02-21 NOTE — Op Note (Signed)
[name], [name]              ACCOUNT NO.:  [id number]  MEDICAL RECORD NO.:  [id number]  LOCATION:  [facility]                          FACILITY:  [facility]  PHYSICIAN:  Dois Davenport A. Catherine Oak, M.D. DATE OF BIRTH:  1970-09-20  DATE OF PROCEDURE:  02/20/2011 DATE OF DISCHARGE:  02/20/2011                              OPERATIVE REPORT   PREOPERATIVE DIAGNOSIS:  Left ovarian cyst, and desire for sterilization.  POSTOPERATIVE DIAGNOSIS:  Left ovarian cyst,desire for Sterilization and pelvic adhesions.  PROCEDURE:  Robotically-assisted laparoscopic ovarian cystectomy, lysis of adhesion, tubal ligation, and removal of Mirena intrauterine device.  SURGEON:  Crist Fat. Joletta Manner, MD  ASSISTANT:  [place] J. Lowell Guitar, PA  ESTIMATED BLOOD LOSS:  Minimal.  PROCEDURE:  After being informed of the planned procedure with possible complications including bleeding, infection, injury to other organs, informed consent was obtained.  The patient was taken to OR #7, given general anesthesia with endotracheal intubation.  She was placed in a lithotomy position on a sticky mattress and beanbag with knee-high sequential compressive devices, both arms padded and tucked on each side.  Pelvic exam revealed an anteverted uterus, normal size and shape. Right adnexa was normal.  Left adnexa was increased to approximately 6 cm and mobile.  A weighted speculum was inserted.  Anterior lip of the cervix was grasped with a tenaculum forceps and the uterus was sounded at 8 cm. A Zumi intrauterine manipulator was easily positioned in the uterus, its balloon was inflated with 9 mL of saline.  Retractors and tenaculum was removed and a Foley catheter was inserted in her bladder.  We infiltrated the umbilical area with 5 mL of Marcaine 0.25 and performed a semielliptical incision which was brought down sharply to the fascia.  The fascia was grasped with 2 Kocher forceps, incised with Mayo scissors.  Peritoneum was entered bluntly.  A  pursestring suture of 0 Vicryl was placed on the fascia and a 10-mm Hasson trocar was easily inserted in the abdominal cavity and held in place with a pursestring Suture.This allowed for easy insufflation of a pneumoperitoneum with warmed CO2 at a maximum pressure of 15 mmHg.  We then positioned,under direct visualization, an 8-mm robotic trocar on the left and an 8 mm robotic trocar on the right after infiltrating with Marcaine 0.25 %.  OBSERVATION:  Anterior cul-de-sac was normal.  Posterior cul-de-sac was normal.  Uterus had a small left cornua subserosal fibroid measuring about 1 cm.  Right tube was normal.  Right ovary was normal.  Left tube was normal.  Left ovary had a cyst with a smooth surface, was mobile with an elongated utero-ovarian ligament, and the overall volume of the ovary was approximately 6.5 cm.  Cul-de-sac was normal.  Appendix was normal.  Liver was normal.  Gallbladder was normal.  We did see some adhesions pulling the sigmoid colon to the left pelvic wall.  These were of grade 1 and grade 2.         We then docked the robot and placed a monopolar scissor in arm #1 and a PK Gyrus forceps in arm #2.       We proceed with sharp lysis of adhesions on the left  pelvic wall to free the sigmoid colon. This was performed easily.Pelvic washings were then obtained.        The capsule of the cyst was opened using monopolar scissors which rapidly evacuated clear fluid. Changing the monopolar scissor for a long tipped forceps we were able to enucleate the cyst wall from the ovary with traction and counter traction.  The specimen was removed from the abdomen via the right trocar.  During the removal of the cyst, we noted that there was another cyst on the back which would explain the image on ultrasound of a larger cyst with a septation.  We now realise that these were 2 simple cysts measuring approximately 3 cm each.  Flipping the ovary to the other side, we then opened the  capsule of the second cyst in the same fashion using monopolar scissors and then peeled the cyst wall away from the ovary using traction and counter traction with 2 grasping forceps.  The specimen of the second cyst and its wall was removed via the right trocar.  We then proceeded with profuse irrigation with warm saline and systematic cauterization with bipolar forceps to achieve satisfactory hemostasis.  We then proceed with cauterization of both tubes in the isthmic ampullary area 1.5 cm distance and each tube was then sectioned, endolumen was cauterized with satisfactory hemostasis.    We irrigate profusely with warm saline and again confirm a satisfactory hemostasis.  The robot was undocked.  Console time was 55 minutes.  All instruments were removed.  Trocars were removed under direct visualization after removing the pneumoperitoneum.  The fascia of the umbilical incision was closed with the pursestring suture of 0 Vicryl and the skin of all incision was then closed with a subcuticular suture of 3-0 Monocryl and Dermabond.  The Zumi intrauterine manipulator was removed after deflating the balloon, and a vaginal exam revealed bleeding on the site of the tenaculum which was controlled with a figure-of-eight stitch of 3-0 Vicryl.  Foley catheter was removed.  Instruments and sponge count was complete x2.  Estimated blood loss was minimal.  The procedure was well tolerated by the patient who was taken to recovery room and will be discharged home in a well and stable condition.  SPECIMEN:  Cyst wall x2 and pelvic washings sent to pathology.     Crist Fat [name], M.D.     SAR/MEDQ  D:  02/20/2011  T:  02/21/2011  Job:  [id number]

## 2011-02-23 ENCOUNTER — Encounter (HOSPITAL_COMMUNITY): Payer: Self-pay | Admitting: Obstetrics and Gynecology

## 2011-05-29 ENCOUNTER — Encounter: Payer: Self-pay | Admitting: Family Medicine

## 2011-05-29 ENCOUNTER — Ambulatory Visit (INDEPENDENT_AMBULATORY_CARE_PROVIDER_SITE_OTHER): Payer: Managed Care, Other (non HMO) | Admitting: Family Medicine

## 2011-05-29 VITALS — BP 120/76 | HR 82 | Temp 98.2°F | Resp 16 | Ht 58.25 in | Wt 147.2 lb

## 2011-05-29 DIAGNOSIS — J329 Chronic sinusitis, unspecified: Secondary | ICD-10-CM

## 2011-05-29 DIAGNOSIS — J40 Bronchitis, not specified as acute or chronic: Secondary | ICD-10-CM

## 2011-05-29 DIAGNOSIS — R059 Cough, unspecified: Secondary | ICD-10-CM

## 2011-05-29 DIAGNOSIS — J209 Acute bronchitis, unspecified: Secondary | ICD-10-CM

## 2011-05-29 DIAGNOSIS — R05 Cough: Secondary | ICD-10-CM

## 2011-05-29 MED ORDER — PREDNISONE 20 MG PO TABS: ORAL_TABLET | ORAL | Status: DC

## 2011-05-29 MED ORDER — LEVOFLOXACIN 500 MG PO TABS: 500.0000 mg | ORAL_TABLET | Freq: Every day | ORAL | Status: AC

## 2011-05-29 MED ORDER — HYDROCODONE-HOMATROPINE 5-1.5 MG/5ML PO SYRP: 5.0000 mL | ORAL_SOLUTION | Freq: Three times a day (TID) | ORAL | Status: AC | PRN

## 2011-05-29 NOTE — Patient Instructions (Signed)

## 2011-05-29 NOTE — Progress Notes (Signed)
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 She's been trying Zyrtec and Claritin without success. She feels tired and a little depressed and having trouble getting motivated. No history of asthma  No rash or abdominal pain  No GI, GU symptoms-cough at night-no night sweats   Objective: Alert, no acute distress  HEENT: Unremarkable  Neck: Supple no adenopathy  Chest: Dry cough, few rhonchi and wheezes on expiration  Heart: Regular no murmur or gallop  Assessment: Seasonal allergies with bronchitis  Plan Levaquin, prednisone and Hydromet

## 2011-06-01 ENCOUNTER — Telehealth: Payer: Self-pay

## 2011-06-01 NOTE — Telephone Encounter (Signed)
PT WAS SEEN AND GIVEN AN ANTIBIOTIC AND IS ISN'T HELPING, WOULD LIKE TO HAVE AN INHALER CALLED IN IF POSSIBLE PLEASE CALL PT AT [phone]    [facility] ON [address]

## 2011-06-02 MED ORDER — ALBUTEROL SULFATE HFA 108 (90 BASE) MCG/ACT IN AERS: 2.0000 | INHALATION_SPRAY | RESPIRATORY_TRACT | Status: DC | PRN

## 2011-06-02 NOTE — Telephone Encounter (Signed)
Can we rx inhaler for patient?

## 2011-06-02 NOTE — Telephone Encounter (Signed)
Sure we can send in an inhaler. Please also document patient's current symptoms.  [name]

## 2011-06-04 MED ORDER — ALBUTEROL SULFATE HFA 108 (90 BASE) MCG/ACT IN AERS: 2.0000 | INHALATION_SPRAY | RESPIRATORY_TRACT | Status: DC | PRN

## 2011-06-04 NOTE — Telephone Encounter (Signed)
PT STILL HAS COUGH, DOESN'T KNOW IF SHE STILL NEEDS AN INHALER. TODAY IS LAST DAY OF HER ABX. PT TOOK LAST OF PREDNISONE YESTERDAY. PT WANTS TO KNOW IF SHE NEEDS TO GET PREDNISONE REFILLED. SHE STATES SHE HAS 2 REFILLS.Marland Kitchen?

## 2011-06-04 NOTE — Telephone Encounter (Signed)
Called patient. Advised her to not refill the prednisone. We did call her in a new inhaler.

## 2011-06-04 NOTE — Telephone Encounter (Signed)
Hold on RF of prednisone, as it is not good to take this back to back. Can call in inhaler.  [name]

## 2011-07-17 ENCOUNTER — Other Ambulatory Visit: Payer: Self-pay | Admitting: Orthopedic Surgery

## 2011-07-17 DIAGNOSIS — M542 Cervicalgia: Secondary | ICD-10-CM

## 2011-07-22 ENCOUNTER — Ambulatory Visit: Payer: Self-pay | Admitting: Obstetrics and Gynecology

## 2011-07-23 ENCOUNTER — Other Ambulatory Visit: Payer: Self-pay

## 2011-09-11 ENCOUNTER — Encounter (HOSPITAL_COMMUNITY): Payer: Self-pay | Admitting: *Deleted

## 2011-09-11 ENCOUNTER — Emergency Department (HOSPITAL_COMMUNITY): Payer: Managed Care, Other (non HMO)

## 2011-09-11 ENCOUNTER — Emergency Department (HOSPITAL_COMMUNITY)
Admission: EM | Admit: 2011-09-11 | Discharge: 2011-09-11 | Disposition: A | Discharge status: Home / Self Care | Payer: Managed Care, Other (non HMO) | Attending: Emergency Medicine | Admitting: Emergency Medicine

## 2011-09-11 DIAGNOSIS — R202 Paresthesia of skin: Secondary | ICD-10-CM

## 2011-09-11 DIAGNOSIS — R42 Dizziness and giddiness: Secondary | ICD-10-CM | POA: Insufficient documentation

## 2011-09-11 DIAGNOSIS — K219 Gastro-esophageal reflux disease without esophagitis: Secondary | ICD-10-CM | POA: Insufficient documentation

## 2011-09-11 DIAGNOSIS — R51 Headache: Secondary | ICD-10-CM | POA: Insufficient documentation

## 2011-09-11 DIAGNOSIS — R209 Unspecified disturbances of skin sensation: Secondary | ICD-10-CM | POA: Insufficient documentation

## 2011-09-11 DIAGNOSIS — M542 Cervicalgia: Secondary | ICD-10-CM | POA: Insufficient documentation

## 2011-09-11 LAB — POCT I-STAT, CHEM 8
BUN: 8 mg/dL (ref 6–23)
HCT: 38 % (ref 36.0–46.0)
Hemoglobin: 12.9 g/dL (ref 12.0–15.0)
Sodium: 140 mEq/L (ref 135–145)
TCO2: 21 mmol/L (ref 0–100)

## 2011-09-11 LAB — CBC WITH DIFFERENTIAL/PLATELET
Basophils Absolute: 0 10*3/uL (ref 0.0–0.1)
Eosinophils Relative: 2 % (ref 0–5)
Lymphocytes Relative: 20 % (ref 12–46)
MCV: 72.1 fL — ABNORMAL LOW (ref 78.0–100.0)
Platelets: 254 10*3/uL (ref 150–400)
RDW: 13.8 % (ref 11.5–15.5)
WBC: 9.8 10*3/uL (ref 4.0–10.5)

## 2011-09-11 MED ORDER — ACETAMINOPHEN 325 MG PO TABS
650.0000 mg | ORAL_TABLET | Freq: Once | ORAL | Status: AC
  Administered 2011-09-11: 650 mg via ORAL
  Filled 2011-09-11: qty 2

## 2011-09-11 NOTE — ED Provider Notes (Signed)
History     CSN: [id number]  Arrival date & time 09/11/11  0031   First MD Initiated Contact with Patient 09/11/11 0044      1:13 AM HPI Patient reports she was at home and when she suddenly developed right hand numbness, neck pain,and posterior headache. Reports she became dizzy and felt disoriented. States symptoms have mostly resolved with the exception of mild headache. 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 Patient is a 41 y.o. female presenting with headaches. The history is provided by the patient.  Headache  This is a new problem. The current episode started 1 to 2 hours ago. The problem occurs constantly. The problem has been gradually improving. The pain is located in the occipital region. Quality: ache. The pain is at a severity of 5/10. The pain is moderate. The pain radiates to the right shoulder and right neck. Associated symptoms include near-syncope. Pertinent negatives include no fever, no orthopnea, no palpitations, no shortness of breath, no nausea and no vomiting. She has tried nothing for the symptoms.    Past Medical History  Diagnosis Date  . PMS (premenstrual syndrome)   . Acid reflux   . Menstrual migraine   . H/O varicella   . Monilial vaginitis 2005  . Leukorrhea 2006    Physiologic  . Weight gain 2010  . PMS (premenstrual syndrome) 2011  . Ovarian cyst, left 2012    Past Surgical History  Procedure Date  . Wisdom tooth extraction   . Laparoscopic tubal ligation 02/20/2011    Procedure: LAPAROSCOPIC TUBAL LIGATION;  Surgeon: Esmeralda Arthur, MD;  Location: [facility];  Service: Gynecology;  Laterality: Bilateral;  with Removal of Mirena and Robotic Assisted Bilateral Tubal Ligation  . Robotic assisted gynecologic procedure 02-2011    ovarian cystectomy  . Tubal ligation 02/20/2011    Family History  Problem Relation Age of Onset  . Cancer Mother     uterine  . Cancer  Paternal Aunt 18    breast    History  Substance Use Topics  . Smoking status: Never Smoker   . Smokeless tobacco: Not on file  . Alcohol Use: No    OB History    Grav Para Term Preterm Abortions TAB SAB Ect Mult Living   3 2              Review of Systems  Constitutional: Negative for fever and chills.  HENT: Positive for neck pain. Negative for congestion, sore throat, rhinorrhea, trouble swallowing, neck stiffness, postnasal drip and sinus pressure.   Respiratory: Negative for cough and shortness of breath.   Cardiovascular: Positive for near-syncope. Negative for palpitations and orthopnea.  Gastrointestinal: Negative for nausea and vomiting.  Musculoskeletal: Negative for back pain.  Skin: Negative for wound.  Neurological: Positive for dizziness, speech difficulty, numbness and headaches. Negative for seizures, weakness and light-headedness.  All other systems reviewed and are negative.    Allergies  Vicodin  Home Medications   Current Outpatient Rx  Name Route Sig Dispense Refill  . ALBUTEROL SULFATE HFA 108 (90 BASE) MCG/ACT IN AERS Inhalation Inhale 2 puffs into the lungs every 4 (four) hours as needed for wheezing. 1 Inhaler 0  . LORATADINE 10 MG PO TABS Oral Take 10 mg by mouth daily.    Carma Leaven M PLUS PO TABS Oral Take 1 tablet by mouth daily.      Marland Kitchen PREDNISONE 20 MG  PO TABS  2 daily with food 10 tablet 2    BP 128/38  Pulse 70  Temp 98 F (36.7 C) (Oral)  Resp 16  SpO2 100%  Physical Exam  Vitals reviewed. Constitutional: She is oriented to person, place, and time. Vital signs are normal. She appears well-developed and well-nourished.  HENT:  Head: Normocephalic and atraumatic.  Right Ear: Tympanic membrane, external ear and ear canal normal.  Left Ear: Tympanic membrane, external ear and ear canal normal.  Nose: Nose normal.  Mouth/Throat: Oropharynx is clear and moist and mucous membranes are normal. No oropharyngeal exudate.  Eyes:  Conjunctivae are normal. Pupils are equal, round, and reactive to light.  Neck: Normal range of motion. Neck supple. Muscular tenderness present. No spinous process tenderness present.    Cardiovascular: Normal rate, regular rhythm and normal heart sounds.  Exam reveals no friction rub.   No murmur heard. Pulmonary/Chest: Effort normal and breath sounds normal. She has no wheezes. She has no rhonchi. She has no rales. She exhibits no tenderness.  Musculoskeletal: Normal range of motion.  Lymphadenopathy:    She has no cervical adenopathy.  Neurological: She is alert and oriented to person, place, and time. No cranial nerve deficit (tested CN III-XII). Coordination (Neg pronator drift, nystagmus and no past pointing) normal.  Skin: Skin is warm and dry. No rash noted. No erythema. No pallor.    ED Course  Procedures  Results for orders placed during the hospital encounter of 09/11/11  CBC WITH DIFFERENTIAL      Component Value Range   WBC 9.8  4.0 - 10.5 K/uL   RBC 4.88  3.87 - 5.11 MIL/uL   Hemoglobin 12.8  12.0 - 15.0 g/dL   HCT 16.1 (*) 09.6 - 04.5 %   MCV 72.1 (*) 78.0 - 100.0 fL   MCH 26.2  26.0 - 34.0 pg   MCHC 36.4 (*) 30.0 - 36.0 g/dL   RDW 40.9  81.1 - 91.4 %   Platelets 254  150 - 400 K/uL   Neutrophils Relative 70  43 - 77 %   Neutro Abs 6.9  1.7 - 7.7 K/uL   Lymphocytes Relative 20  12 - 46 %   Lymphs Abs 1.9  0.7 - 4.0 K/uL   Monocytes Relative 7  3 - 12 %   Monocytes Absolute 0.7  0.1 - 1.0 K/uL   Eosinophils Relative 2  0 - 5 %   Eosinophils Absolute 0.2  0.0 - 0.7 K/uL   Basophils Relative 0  0 - 1 %   Basophils Absolute 0.0  0.0 - 0.1 K/uL  POCT I-STAT, CHEM 8      Component Value Range   Sodium 140  135 - 145 mEq/L   Potassium 3.5  3.5 - 5.1 mEq/L   Chloride 106  96 - 112 mEq/L   BUN 8  6 - 23 mg/dL   Creatinine, Ser 7.82  0.50 - 1.10 mg/dL   Glucose, Bld 956 (*) 70 - 99 mg/dL   Calcium, Ion 2.13 (*) 1.12 - 1.23 mmol/L   TCO2 21  0 - 100 mmol/L    Hemoglobin 12.9  12.0 - 15.0 g/dL   HCT 08.6  57.8 - 46.9 %   Ct Head Wo Contrast  09/11/2011  *RADIOLOGY REPORT*  Clinical Data:  Headache and neck pain.  And weakness and numbness. No known injury.  CT HEAD WITHOUT CONTRAST CT CERVICAL SPINE WITHOUT CONTRAST  Technique:  Multidetector CT imaging of  the head and cervical spine was performed following the standard protocol without intravenous contrast.  Multiplanar CT image reconstructions of the cervical spine were also generated.  Comparison:   None  CT HEAD  Findings: There is no evidence of acute intracranial hemorrhage, mass lesion, brain edema or extra-axial fluid collection.  The ventricles and subarachnoid spaces are appropriately sized for age. There is no CT evidence of acute cortical infarction.  The visualized paranasal sinuses are clear. The calvarium is intact.  IMPRESSION: Normal head CT.  CT CERVICAL SPINE  Findings: There is mild reversal of the usual cervical lordosis. There is no focal angulation or listhesis.  There is no evidence of acute fracture.  There is disc space loss with uncinate spurring at C5-C6 and C6-C7.  No high-grade foraminal stenosis or acute soft tissue findings are identified.  IMPRESSION:  1.  Lower cervical spondylosis as described. 2.  No evidence of acute fracture, subluxation or other acute process.  Original Report Authenticated By: Gerrianne Scale, M.D.   Ct Cervical Spine Wo Contrast  09/11/2011  *RADIOLOGY REPORT*  Clinical Data:  Headache and neck pain.  And weakness and numbness. No known injury.  CT HEAD WITHOUT CONTRAST CT CERVICAL SPINE WITHOUT CONTRAST  Technique:  Multidetector CT imaging of the head and cervical spine was performed following the standard protocol without intravenous contrast.  Multiplanar CT image reconstructions of the cervical spine were also generated.  Comparison:   None  CT HEAD  Findings: There is no evidence of acute intracranial hemorrhage, mass lesion, brain edema or  extra-axial fluid collection.  The ventricles and subarachnoid spaces are appropriately sized for age. There is no CT evidence of acute cortical infarction.  The visualized paranasal sinuses are clear. The calvarium is intact.  IMPRESSION: Normal head CT.  CT CERVICAL SPINE  Findings: There is mild reversal of the usual cervical lordosis. There is no focal angulation or listhesis.  There is no evidence of acute fracture.  There is disc space loss with uncinate spurring at C5-C6 and C6-C7.  No high-grade foraminal stenosis or acute soft tissue findings are identified.  IMPRESSION:  1.  Lower cervical spondylosis as described. 2.  No evidence of acute fracture, subluxation or other acute process.  Original Report Authenticated By: Gerrianne Scale, M.D.      MDM    Low suspicion for CVA. Imaging and labs all within normal limits. Discussed patient with Dr. Estell Harpin, feel the patient can likely be discharged with close followup with primary care provider. Discussed this plan with patient he voices understanding and is ready for discharge. But highly advised patient to return to emergency department if symptoms return or worsen. Patient has no medical history for hypertension, hypercholesterolemia, or TIA.  Thomasene Lot, PA-C 09/11/11 [phone]

## 2011-09-11 NOTE — ED Notes (Signed)
Pt ambulatory to restroom with minimal assistance   

## 2011-09-11 NOTE — ED Notes (Signed)
Per EMS - pt from home, pt w/ hx of bulging discs in her spine, pt currently undergoing PT for this. Pt w/ hx of hand numbness secondary to bulging discs. Pt was very active today and while cooking dinner began noting difficulty gripping. Pt sat down in a chair known to aggravate bulging discs and began experiencing increased tingling and numbness to hands - began to hyperventilate and was very anxious on EMS arrival. Pt given emotional support by EMS en route and is now feeling much better on ED arrival. Pt now states she is only having some numbness in her hands which is similar to her hx of numbness.

## 2011-09-11 NOTE — ED Notes (Signed)
WUJ:[id number]<JY> Expected date:09/11/11<BR> Expected time:12:23 AM<BR> Means of arrival:Ambulance<BR> Comments:<BR> Headache; numbness/tingling

## 2011-09-12 NOTE — ED Provider Notes (Signed)
Medical screening examination/treatment/procedure(s) were performed by non-physician practitioner and as supervising physician I was immediately available for consultation/collaboration.   Ngoc Daughtridge L Murtaza Shell, MD 09/12/11 0622 

## 2011-09-14 ENCOUNTER — Ambulatory Visit (INDEPENDENT_AMBULATORY_CARE_PROVIDER_SITE_OTHER): Payer: Managed Care, Other (non HMO) | Admitting: Family Medicine

## 2011-09-14 VITALS — BP 121/71 | HR 98 | Temp 98.1°F | Resp 16 | Ht 58.5 in | Wt 150.0 lb

## 2011-09-14 DIAGNOSIS — IMO0002 Reserved for concepts with insufficient information to code with codable children: Secondary | ICD-10-CM

## 2011-09-14 DIAGNOSIS — R51 Headache: Secondary | ICD-10-CM

## 2011-09-14 DIAGNOSIS — M542 Cervicalgia: Secondary | ICD-10-CM

## 2011-09-14 DIAGNOSIS — M5410 Radiculopathy, site unspecified: Secondary | ICD-10-CM

## 2011-09-14 DIAGNOSIS — Z13220 Encounter for screening for lipoid disorders: Secondary | ICD-10-CM

## 2011-09-14 MED ORDER — MELOXICAM 7.5 MG PO TABS: 7.5000 mg | ORAL_TABLET | Freq: Every day | ORAL | Status: DC

## 2011-09-14 MED ORDER — BUTALBITAL-APAP-CAFFEINE 50-325-40 MG PO TABS: 1.0000 | ORAL_TABLET | Freq: Four times a day (QID) | ORAL | Status: DC | PRN

## 2011-09-14 MED ORDER — METHYLPREDNISOLONE 4 MG PO KIT: PACK | ORAL | Status: AC

## 2011-09-14 NOTE — Progress Notes (Signed)
 [facility]:  Office Visit  Chief Complaint:  Chief Complaint  Patient presents with  . severe headache back of head with pulsating feeling  . "drift" feeling    went to [place] thursday was unresponsive, trembles (teeth/limbs)   . does have bulging disc    c5/c6-c6/c7    HPI: [name] is a 41 y.o. female who complains of  Hospital follow-up. Currently has  "throbbing HA on occipital area" does not hurt just feels a pulse in head. Takes advil with relief. Feels slightly disoriented and not as sharp as normal. No h/o of migraine HAs. 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  CT scan of head was normal and CVA/TIA ruled out. CT spine C5-C7 spurring, otherwise nl. Complains of radicular pain down arms   Past Medical History  Diagnosis Date  . PMS (premenstrual syndrome)   . Acid reflux   . Menstrual migraine   . H/O varicella   . Monilial vaginitis 2005  . Leukorrhea 2006    Physiologic  . Weight gain 2010  . PMS (premenstrual syndrome) 2011  . Ovarian cyst, left 2012   Past Surgical History  Procedure Date  . Wisdom tooth extraction   . Laparoscopic tubal ligation 02/20/2011    Procedure: LAPAROSCOPIC TUBAL LIGATION;  Surgeon: Esmeralda Arthur, MD;  Location: [facility];  Service: Gynecology;  Laterality: Bilateral;  with Removal of Mirena and Robotic Assisted Bilateral Tubal Ligation  . Robotic assisted gynecologic procedure 02-2011    ovarian cystectomy  . Tubal ligation 02/20/2011   History   Social History  . Marital Status: Married    Spouse Name: N/A    Number of Children: N/A  . Years of Education: N/A   Social History Main Topics  . Smoking status: Never Smoker   . Smokeless tobacco: None  . Alcohol Use: No  . Drug Use: No  . Sexually Active: Yes    Birth Control/ Protection: IUD     IUD   Other Topics Concern  . None   Social History Narrative  . None   Family History  Problem Relation Age of Onset  . Cancer  Mother     uterine  . Cancer Paternal Aunt 50    breast   Allergies  Allergen Reactions  . Vicodin (Hydrocodone-Acetaminophen) Nausea And Vomiting   Prior to Admission medications   Medication Sig Start Date End Date Taking? Authorizing Provider  Naproxen Sodium (ALEVE PO) Take by mouth.   Yes Historical Provider, MD     ROS: The patient denies fevers, chills, night sweats, unintentional weight loss, chest pain, palpitations, wheezing, dyspnea on exertion, nausea, vomiting, abdominal pain, dysuria, hematuria, melena, numbness, weakness, or tingling.  + HA, fuzzy feeling  All other systems have been reviewed and were otherwise negative with the exception of those mentioned in the HPI and as above.    PHYSICAL EXAM: Filed Vitals:   09/14/11 1508  BP: 121/71  Pulse: 98  Temp: 98.1 F (36.7 C)  Resp: 16   Filed Vitals:   09/14/11 1508  Height: 4' 10.5" (1.486 m)  Weight: 150 lb (68.04 kg)   Body mass index is 30.82 kg/(m^2).  General: Alert, no acute distress HEENT:  Normocephalic, atraumatic, oropharynx patent. EOMI, PERRLA, fundoscopic exam grossly nl Cardiovascular:  Regular rate and rhythm, no rubs murmurs or gallops.  No Carotid bruits, radial pulse intact. No pedal edema.  Respiratory: Clear to auscultation bilaterally.  No wheezes,  rales, or rhonchi.  No cyanosis, no use of accessory musculature GI: No organomegaly, abdomen is soft and non-tender, positive bowel sounds.  No masses. Skin: No rashes. Neurologic: Facial musculature symmetric. CN 2-12 grossly intact Psychiatric: Patient is appropriate throughout our interaction. Lymphatic: No cervical lymphadenopathy Musculoskeletal: Gait intact.   LABS: Results for orders placed during the hospital encounter of 09/11/11  CBC WITH DIFFERENTIAL      Component Value Range   WBC 9.8  4.0 - 10.5 K/uL   RBC 4.88  3.87 - 5.11 MIL/uL   Hemoglobin 12.8  12.0 - 15.0 g/dL   HCT 16.1 (*) 09.6 - 04.5 %   MCV 72.1 (*) 78.0 -  100.0 fL   MCH 26.2  26.0 - 34.0 pg   MCHC 36.4 (*) 30.0 - 36.0 g/dL   RDW 40.9  81.1 - 91.4 %   Platelets 254  150 - 400 K/uL   Neutrophils Relative 70  43 - 77 %   Neutro Abs 6.9  1.7 - 7.7 K/uL   Lymphocytes Relative 20  12 - 46 %   Lymphs Abs 1.9  0.7 - 4.0 K/uL   Monocytes Relative 7  3 - 12 %   Monocytes Absolute 0.7  0.1 - 1.0 K/uL   Eosinophils Relative 2  0 - 5 %   Eosinophils Absolute 0.2  0.0 - 0.7 K/uL   Basophils Relative 0  0 - 1 %   Basophils Absolute 0.0  0.0 - 0.1 K/uL  POCT I-STAT, CHEM 8      Component Value Range   Sodium 140  135 - 145 mEq/L   Potassium 3.5  3.5 - 5.1 mEq/L   Chloride 106  96 - 112 mEq/L   BUN 8  6 - 23 mg/dL   Creatinine, Ser 7.82  0.50 - 1.10 mg/dL   Glucose, Bld 956 (*) 70 - 99 mg/dL   Calcium, Ion 2.13 (*) 1.12 - 1.23 mmol/L   TCO2 21  0 - 100 mmol/L   Hemoglobin 12.9  12.0 - 15.0 g/dL   HCT 08.6  57.8 - 46.9 %     EKG/XRAY:   Primary read interpreted by Dr. Conley Rolls at [facility].   ASSESSMENT/PLAN: Encounter Diagnoses  Name Primary?  . Headache Yes  . Neck pain   . Radicular pain   . Screening for hyperlipidemia    Hospital follow-up. CT head was negative for CVA/TIA. C-spine showed spurring of C5-C7.   1. HA-Rx fioricet 2. Neck pain/radicular pain-rx mobic and medrol dose pack 3. Lipid panel ( patient was seen in ER for possible TIA/CVA rule out) 5. F/u in 1 week for Zoloft, and for check up of sxs. If  no improvement then will refer neurology.  Go to ER if CP/SOB/asymmetric weakness  or worsening sxs ASAP  ,  PHUONG, DO 09/14/2011 3:58 PM

## 2011-09-15 ENCOUNTER — Ambulatory Visit (INDEPENDENT_AMBULATORY_CARE_PROVIDER_SITE_OTHER): Payer: Managed Care, Other (non HMO) | Admitting: Family Medicine

## 2011-09-15 DIAGNOSIS — Z13220 Encounter for screening for lipoid disorders: Secondary | ICD-10-CM

## 2011-09-15 LAB — LIPID PANEL
Cholesterol: 209 mg/dL — ABNORMAL HIGH (ref 0–200)
HDL: 61 mg/dL (ref >39)
LDL Cholesterol: 130 mg/dL — ABNORMAL HIGH (ref 0–99)
Total CHOL/HDL Ratio: 3.4 ratio
Triglycerides: 90 mg/dL (ref <150)
VLDL: 18 mg/dL (ref 0–40)

## 2011-09-17 ENCOUNTER — Telehealth: Payer: Self-pay | Admitting: Family Medicine

## 2011-09-17 NOTE — Progress Notes (Signed)
  [facility]:  Office Visit  Chief Complaint:  Chief Complaint  Patient presents with  . Labs Only    lipid    HPI:   Past Medical History  Diagnosis Date  . PMS (premenstrual syndrome)   . Acid reflux   . Menstrual migraine   . H/O varicella   . Monilial vaginitis 2005  . ukorrhea 2006    Physiologic  . Weight gain 2010  . PMS (premenstrual syndrome) 2011  . Ovarian cyst, left 2012   Past Surgical History  Procedure Date  . Wisdom tooth extraction   . Laparoscopic tubal ligation 02/20/2011    Procedure: LAPAROSCOPIC TUBAL LIGATION;  Surgeon: Esmeralda Arthur, MD;  Location: [facility];  Service: Gynecology;  Laterality: Bilateral;  with Removal of Mirena and Robotic Assisted Bilateral Tubal Ligation  . Robotic assisted gynecologic procedure 02-2011    ovarian cystectomy  . Tubal ligation 02/20/2011   History   Social History  . Marital Status: Married    Spouse Name: N/A    Number of Children: N/A  . Years of Education: N/A   Social History Main Topics  . Smoking status: Never Smoker   . Smokeless tobacco: Not on file  . Alcohol Use: No  . Drug Use: No  . Sexually Active: Yes    Birth Control/ Protection: IUD     IUD   Other Topics Concern  . Not on file   Social History Narrative  . No narrative on file   Family History  Problem Relation Age of Onset  . Cancer Mother     uterine  . Cancer Paternal Aunt 63    breast   Allergies  Allergen Reactions  . Vicodin (Hydrocodone-Acetaminophen) Nausea And Vomiting   Prior to Admission medications   Medication Sig Start Date End Date Taking? Authorizing Provider  butalbital-acetaminophen-caffeine (FIORICET) 50-325-40 MG per tablet Take 1-2 tablets by mouth every 6 (six) hours as needed for headache. 09/14/11 09/13/12   P , DO  meloxicam (MOBIC) 7.5 MG tablet Take 1 tablet (7.5 mg total) by mouth daily. 09/14/11 09/13/12   P , DO  methylPREDNISolone (MEDROL, PAK,) 4 MG tablet follow  package directions 09/14/11 09/21/11   P , DO  Naproxen Sodium (AVE PO) Take by mouth.    Historical Provider, MD     ASSESSMENT/PLAN: Encounter Diagnosis  Name Primary?  . Screening for hyperlipidemia Yes   Patient here for just labs ( no charge for visit except lab draw)    ,  PHUONG, DO 09/17/2011 12:20 PM   \

## 2011-09-17 NOTE — Telephone Encounter (Signed)
D/w patient labs.  

## 2011-09-30 ENCOUNTER — Ambulatory Visit: Payer: Self-pay | Admitting: Obstetrics and Gynecology

## 2011-10-20 ENCOUNTER — Telehealth: Payer: Self-pay

## 2011-10-20 ENCOUNTER — Other Ambulatory Visit: Payer: Self-pay | Admitting: Family Medicine

## 2011-10-20 DIAGNOSIS — M5410 Radiculopathy, site unspecified: Secondary | ICD-10-CM

## 2011-10-20 DIAGNOSIS — M542 Cervicalgia: Secondary | ICD-10-CM

## 2011-10-20 MED ORDER — MELOXICAM 7.5 MG PO TABS: 7.5000 mg | ORAL_TABLET | Freq: Every day | ORAL | Status: DC

## 2011-10-20 NOTE — Telephone Encounter (Signed)
Done for patient.

## 2011-10-20 NOTE — Telephone Encounter (Signed)
LMOM RX sent 

## 2011-10-20 NOTE — Telephone Encounter (Signed)
Dr Conley Rolls gave patient some mobic to try and would like a rx of this called in if possible [facility] [address]

## 2011-10-20 NOTE — Telephone Encounter (Signed)
Meloxicam 7.5mg  was given, would like renewal to [facility] [address]

## 2011-11-19 ENCOUNTER — Ambulatory Visit: Payer: Self-pay | Admitting: Obstetrics and Gynecology

## 2011-11-26 ENCOUNTER — Ambulatory Visit: Payer: Self-pay | Admitting: Obstetrics and Gynecology

## 2011-12-31 ENCOUNTER — Encounter: Payer: Self-pay | Admitting: Physician Assistant

## 2011-12-31 ENCOUNTER — Ambulatory Visit (INDEPENDENT_AMBULATORY_CARE_PROVIDER_SITE_OTHER): Payer: Managed Care, Other (non HMO) | Admitting: Physician Assistant

## 2011-12-31 VITALS — BP 106/69 | HR 78 | Temp 98.2°F | Resp 16 | Ht 60.0 in | Wt 158.0 lb

## 2011-12-31 DIAGNOSIS — M542 Cervicalgia: Secondary | ICD-10-CM

## 2011-12-31 DIAGNOSIS — Z0000 Encounter for general adult medical examination without abnormal findings: Secondary | ICD-10-CM

## 2011-12-31 DIAGNOSIS — Z23 Encounter for immunization: Secondary | ICD-10-CM

## 2011-12-31 DIAGNOSIS — E785 Hyperlipidemia, unspecified: Secondary | ICD-10-CM

## 2011-12-31 DIAGNOSIS — IMO0002 Reserved for concepts with insufficient information to code with codable children: Secondary | ICD-10-CM

## 2011-12-31 DIAGNOSIS — G43909 Migraine, unspecified, not intractable, without status migrainosus: Secondary | ICD-10-CM

## 2011-12-31 DIAGNOSIS — R51 Headache: Secondary | ICD-10-CM

## 2011-12-31 DIAGNOSIS — M5410 Radiculopathy, site unspecified: Secondary | ICD-10-CM

## 2011-12-31 DIAGNOSIS — Z111 Encounter for screening for respiratory tuberculosis: Secondary | ICD-10-CM

## 2011-12-31 LAB — LIPID PANEL
Cholesterol: 205 mg/dL — ABNORMAL HIGH (ref 0–200)
HDL: 61 mg/dL (ref >39)
LDL Cholesterol: 127 mg/dL — ABNORMAL HIGH (ref 0–99)
Triglycerides: 83 mg/dL (ref <150)
VLDL: 17 mg/dL (ref 0–40)

## 2011-12-31 LAB — COMPREHENSIVE METABOLIC PANEL
Alkaline Phosphatase: 38 U/L — ABNORMAL LOW (ref 39–117)
BUN: 9 mg/dL (ref 6–23)
CO2: 26 mEq/L (ref 19–32)
Glucose, Bld: 97 mg/dL (ref 70–99)
Total Bilirubin: 0.4 mg/dL (ref 0.3–1.2)

## 2011-12-31 MED ORDER — BUTALBITAL-APAP-CAFFEINE 50-325-40 MG PO TABS: 1.0000 | ORAL_TABLET | Freq: Four times a day (QID) | ORAL | Status: DC | PRN

## 2011-12-31 MED ORDER — MELOXICAM 7.5 MG PO TABS: 7.5000 mg | ORAL_TABLET | Freq: Every day | ORAL | Status: DC

## 2011-12-31 NOTE — Progress Notes (Signed)
Subjective:    Patient ID: [name], female    DOB: June 13, 1970, 41 y.o.   MRN: [id number]  HPI This 41 y.o. female presents for Wellness examination.  Pap/breast exams are performed by Dr. Annamarie Dawley.  Additionally, she needs forms completed for work and TB screening.  She is aware of the skin test shortage, and reports that her employer is okay with deferred testing.    Review of Systems  Constitutional: Negative.   HENT: Positive for neck pain and neck stiffness. Negative for hearing loss, ear pain, nosebleeds, congestion, sore throat, facial swelling, rhinorrhea, sneezing, drooling, mouth sores, trouble swallowing, dental problem, voice change, postnasal drip, sinus pressure, tinnitus and ear discharge.        Symptoms began in 08/2011 with an episode of paresthesias, head and neck pain, dizziness and loss of consciousness.  ED evaluation included head CT with was negative.  She has been in PT for the neck and shoulder pain and spasm, without much benefit.  Eyes: Positive for photophobia (with migraine) and visual disturbance. Negative for pain, discharge, redness and itching.  Respiratory: Negative.   Cardiovascular: Negative.   Gastrointestinal: Negative.   Genitourinary: Negative.   Musculoskeletal: Positive for myalgias (neck and shoulders).  Skin: Negative.   Neurological: Positive for dizziness, light-headedness, numbness (facial) and headaches. Negative for tremors, seizures, syncope, facial asymmetry, speech difficulty and weakness.       Has a diagnosis of migraine.  Has never seen a neurologist.  Fioricet helps with the head pain.  She has intermittent ringing in her ears and facial paresthesias, and HA as often as three days each week.  Hematological: Negative.   Psychiatric/Behavioral: Negative.     Tuberculosis Risk Questionnaire  1. Were you born outside the [place] in one of the following parts of the world:    [place], [place], [place], [place] or [place]?  No  2. Have you traveled outside the [place] and lived for more than one month in one of the following parts of the world:  [place], [place], [place], [place] or [place]?  No  3. Do you have a compromised immune system such as from any of the following conditions:  HIV/AIDS, organ or bone marrow transplantation, diabetes, immunosuppressive medicines (e.g. Prednisone, Remicaide), leukemia, lymphoma, cancer of the head or neck, gastrectomy or jejunal bypass, end-stage renal disease (on   dialysis), or silicosis?  No    4. Have you ever done one of the following:    Used crack cocaine, injected illegal drugs, worked or resided in jail or prison, worked or resided at a homeless shelter, or worked as a Research scientist (physical sciences) in direct contact with patients?  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 Have you ever been exposed to anyone with infectious tuberculosis?  No   Tuberculosis Symptom Questionnaire  Do you currently have any of the following symptoms?  1. Unexplained cough lasting more than 3 weeks? No  Unexplained fever lasting more than 3 weeks. No   3. Night Sweats (sweating that leaves the bedclothes and sheets wet)   No  4. Shortness of Breath No  5. Chest Pain No  6. Unintentional weight loss  No  7. Unexplained fatigue (very tired for no reason) No   Past Medical History  Diagnosis Date  . PMS (premenstrual syndrome)   . Acid reflux   . Menstrual migraine   . H/O varicella   . Monilial vaginitis 2005  . Leukorrhea  2006    Physiologic  . Weight gain 2010  . PMS (premenstrual syndrome) 2011  . Ovarian cyst, left 2012    Past Surgical History  Procedure Date  . Wisdom tooth extraction   . Laparoscopic tubal ligation 02/20/2011    Procedure: LAPAROSCOPIC TUBAL LIGATION;  Surgeon: Esmeralda Arthur, MD;  Location: [facility];  Service: Gynecology;  Laterality: Bilateral;  with Removal of Mirena and Robotic Assisted Bilateral  Tubal Ligation  . Robotic assisted gynecologic procedure 02-2011    ovarian cystectomy  . Tubal ligation 02/20/2011    Prior to Admission medications   Medication Sig Start Date End Date Taking? Authorizing Provider  butalbital-acetaminophen-caffeine (FIORICET) 50-325-40 MG per tablet Take 1-2 tablets by mouth every 6 (six) hours as needed for headache. 09/14/11 09/13/12 Yes Thao P Le, DO  meloxicam (MOBIC) 7.5 MG tablet Take 1 tablet (7.5 mg total) by mouth daily. 10/20/11 10/19/12 Yes Sarah Harvie Bridge, PA-C  Multiple Vitamin (MULTIVITAMIN) tablet Take 1 tablet by mouth daily.   Yes Historical Provider, MD    Allergies  Allergen Reactions  . Vicodin (Hydrocodone-Acetaminophen) Nausea And Vomiting    History   Social History  . Marital Status: Married    Spouse Name: Harrold Donath    Number of Children: 2  . Years of Education: 17+   Occupational History  . Clinical Social Work/Outpatient Therapist    Social History Main Topics  . Smoking status: Never Smoker   . Smokeless tobacco: Never Used  . Alcohol Use: No  . Drug Use: No  . Sexually Active: Yes -- Female partner(s)    Birth Control/ Protection: Surgical   Other Topics Concern  . Not on file   Social History Narrative   Lives with husband, almost 46 year old daughter and 39 year old son.    Family History  Problem Relation Age of Onset  . Uterine cancer Mother   . Cancer Mother   . Breast cancer Paternal Aunt 31  . Hypertension Father   . Diabetes Father   . Hypertension Brother   . Hypertension Brother        Objective:   Physical Exam  Vitals reviewed. Constitutional: She is oriented to person, place, and time. Vital signs are normal. She appears well-developed and well-nourished. She is cooperative. No distress.  HENT:  Head: Normocephalic and atraumatic.  Right Ear: Hearing, tympanic membrane, external ear and ear canal normal. No foreign bodies.  Left Ear: Hearing, tympanic membrane, external ear and ear canal  normal. No foreign bodies.  Nose: Nose normal.  Mouth/Throat: Uvula is midline, oropharynx is clear and moist and mucous membranes are normal. No oral lesions. Normal dentition. No dental abscesses or uvula swelling. No oropharyngeal exudate.  Eyes: Conjunctivae normal and EOM are normal. Pupils are equal, round, and reactive to light. Right eye exhibits no discharge. Left eye exhibits no discharge. No scleral icterus.  Fundoscopic exam:      The right eye shows no arteriolar narrowing, no AV nicking, no exudate, no hemorrhage and no papilledema. The right eye shows red reflex.The right eye shows no venous pulsations.      The left eye shows no arteriolar narrowing, no AV nicking, no exudate, no hemorrhage and no papilledema. The left eye shows red reflex.The left eye shows no venous pulsations. Neck: Trachea normal, normal range of motion and full passive range of motion without pain. Neck supple. No spinous process tenderness and no muscular tenderness present. No mass and no thyromegaly present.  Cardiovascular: Normal rate, regular rhythm, normal heart sounds, intact distal pulses and normal pulses.   Pulmonary/Chest: Effort normal and breath sounds normal.  Genitourinary: Rectum normal.  Musculoskeletal: She exhibits no edema and no tenderness.       Cervical back: Normal.       Thoracic back: Normal.       Lumbar back: Normal.  Lymphadenopathy:       Head (right side): No tonsillar, no preauricular, no posterior auricular and no occipital adenopathy present.       Head (left side): No tonsillar, no preauricular, no posterior auricular and no occipital adenopathy present.    She has no cervical adenopathy.    She has no axillary adenopathy.       Right: No supraclavicular adenopathy present.       Left: No supraclavicular adenopathy present.  Neurological: She is alert and oriented to person, place, and time. She has normal strength and normal reflexes. She displays no tremor. No cranial  nerve deficit or sensory deficit. She exhibits normal muscle tone. Coordination and gait normal.  Skin: Skin is warm, dry and intact. No rash noted. She is not diaphoretic. No cyanosis or erythema. Nails show no clubbing.  Psychiatric: She has a normal mood and affect. Her speech is normal and behavior is normal. Judgment and thought content normal.      Assessment & Plan:   1. Routine general medical examination at a health care facility  CBC with Differential, Comprehensive metabolic panel  2. Migraine  Ambulatory referral to Neurology  3. Headache  butalbital-acetaminophen-caffeine (FIORICET) 50-325-40 MG per tablet  4. Hyperlipidemia  Comprehensive metabolic panel, Lipid panel  5. Neck pain  meloxicam (MOBIC) 7.5 MG tablet  6. Radicular pain  meloxicam (MOBIC) 7.5 MG tablet  7. Need for influenza vaccination  Flu vaccine greater than or equal to 3yo preservative free IM  8. Screening-pulmonary TB - asymptomatic, but exposed to higher risk populations.  Needs further screening. Declined Quantiferon Gold test.  Skin test deferred due to shortage.

## 2011-12-31 NOTE — Patient Instructions (Signed)

## 2012-01-01 ENCOUNTER — Encounter: Payer: Self-pay | Admitting: Physician Assistant

## 2012-01-01 ENCOUNTER — Ambulatory Visit (INDEPENDENT_AMBULATORY_CARE_PROVIDER_SITE_OTHER): Payer: Commercial Indemnity | Admitting: Obstetrics and Gynecology

## 2012-01-01 ENCOUNTER — Encounter: Payer: Self-pay | Admitting: Obstetrics and Gynecology

## 2012-01-01 VITALS — BP 106/68 | Ht 59.0 in | Wt 156.0 lb

## 2012-01-01 DIAGNOSIS — M5020 Other cervical disc displacement, unspecified cervical region: Secondary | ICD-10-CM | POA: Insufficient documentation

## 2012-01-01 DIAGNOSIS — Z01419 Encounter for gynecological examination (general) (routine) without abnormal findings: Secondary | ICD-10-CM

## 2012-01-01 LAB — CBC WITH DIFFERENTIAL/PLATELET
Basophils Relative: 0 % (ref 0–1)
Eosinophils Absolute: 0.2 10*3/uL (ref 0.0–0.7)
Eosinophils Relative: 4 % (ref 0–5)
HCT: 36.9 % (ref 36.0–46.0)
Hemoglobin: 13.2 g/dL (ref 12.0–15.0)
MCH: 26 pg (ref 26.0–34.0)
MCHC: 35.8 g/dL (ref 30.0–36.0)
MCV: 72.6 fL — ABNORMAL LOW (ref 78.0–100.0)
Monocytes Absolute: 0.4 10*3/uL (ref 0.1–1.0)
Monocytes Relative: 8 % (ref 3–12)

## 2012-01-01 NOTE — Progress Notes (Signed)
The patient reports: no compalints Contraception:BTL  Last mammogram: n/a Last pap: 07/18/2010 Atypical cells  GC/Chlamydia cultures offered: declined HIV/RPR/HbsAg offered:  declined HSV 1 and 2 glycoprotein offered: declined  Menstrual cycle regular and monthly: Yes every 28 days Menstrual flow normal: Yes heavier than it was before surgery now shorter   Urinary symptoms: none Normal bowel movements: Yes Reports abuse at home: No:   Subjective:    [name] is a 41 y.o. female, G3P2, who presents for an annual exam.     History   Social History  . Marital Status: Married    Spouse Name: Harrold Donath    Number of Children: 2  . Years of Education: 17+   Occupational History  . Clinical Social Work/Outpatient Therapist    Social History Main Topics  . Smoking status: Never Smoker   . Smokeless tobacco: Never Used  . Alcohol Use: No  . Drug Use: No  . Sexually Active: Yes -- Female partner(s)    Birth Control/ Protection: Surgical   Other Topics Concern  . None   Social History Narrative   Lives with husband, almost 5 year old daughter and 12 year old son.    Menstrual cycle:   LMP: Patient's last menstrual period was 12/20/2011.         The following portions of the patient's history were reviewed and updated as appropriate: allergies, current medications, past family history, past medical history, past social history, past surgical history and problem list.  Review of Systems Pertinent items are noted in HPI. Breast:Negative for breast lump,nipple discharge or nipple retraction Gastrointestinal: Negative for abdominal pain, change in bowel habits or rectal bleeding Urinary:negative   Objective:    BP 106/68  Ht 4\' 11"  (1.499 m)  Wt 156 lb (70.761 kg)  BMI 31.51 kg/m2  LMP 12/20/2011    Weight:  Wt Readings from Last 1 Encounters:  01/01/12 156 lb (70.761 kg)          BMI: Body mass index is 31.51 kg/(m^2).  General Appearance: Alert, appropriate  appearance for age. No acute distress HEENT: Grossly normal Neck / Thyroid: Supple, no masses, nodes or enlargement Lungs: clear to auscultation bilaterally Back: No CVA tenderness Breast Exam: No masses or nodes.No dimpling, nipple retraction or discharge. Cardiovascular: Regular rate and rhythm. S1, S2, no murmur Gastrointestinal: Soft, non-tender, no masses or organomegaly Pelvic Exam: Vulva and vagina appear normal. Bimanual exam reveals normal uterus and adnexa. Rectovaginal: not indicated Lymphatic Exam: Non-palpable nodes in neck, clavicular, axillary, or inguinal regions Skin: no rash or abnormalities Neurologic: Normal gait and speech, no tremor  Psychiatric: Alert and oriented, appropriate affect.    Assessment:    Normal gyn exam    Plan:    pap smear return annually or prn STD screening: declined Contraception:bilateral tubal ligation Migraines discussed  OTC magnesium reccommended   Silverio Lay MD

## 2012-01-04 LAB — PAP IG W/ RFLX HPV ASCU

## 2012-01-27 ENCOUNTER — Encounter: Payer: Self-pay | Admitting: Physician Assistant

## 2012-01-27 DIAGNOSIS — G43909 Migraine, unspecified, not intractable, without status migrainosus: Secondary | ICD-10-CM

## 2012-04-03 ENCOUNTER — Other Ambulatory Visit: Payer: Self-pay | Admitting: Physician Assistant

## 2012-04-15 ENCOUNTER — Encounter: Payer: Self-pay | Admitting: Diagnostic Neuroimaging

## 2012-05-16 ENCOUNTER — Encounter: Payer: Self-pay | Admitting: Diagnostic Neuroimaging

## 2012-05-16 ENCOUNTER — Ambulatory Visit (INDEPENDENT_AMBULATORY_CARE_PROVIDER_SITE_OTHER): Payer: Managed Care, Other (non HMO) | Admitting: Diagnostic Neuroimaging

## 2012-05-16 VITALS — BP 106/62 | HR 78 | Temp 98.6°F | Ht 59.0 in | Wt 159.0 lb

## 2012-05-16 DIAGNOSIS — G43909 Migraine, unspecified, not intractable, without status migrainosus: Secondary | ICD-10-CM

## 2012-05-16 DIAGNOSIS — M542 Cervicalgia: Secondary | ICD-10-CM

## 2012-05-16 MED ORDER — GABAPENTIN 300 MG PO CAPS: 300.0000 mg | ORAL_CAPSULE | Freq: Every day | ORAL | Status: DC

## 2012-05-16 NOTE — Patient Instructions (Signed)
Try gabapentin 300mg  at night time x 4 weeks. May use sumatriptan 100mg  + ibuprofen 800mg  for breakthrough headaches/migraine.

## 2012-05-16 NOTE — Progress Notes (Signed)
[facility]  [name] DOB: 1970/09/08  REFERRING CLINICIAN:  HISTORY FROM: patient REASON FOR VISIT: follow up   HISTORICAL  CHIEF COMPLAINT:  Chief Complaint  Patient presents with  . Migraine    follow up    HISTORY OF PRESENT ILLNESS:   UPDATE 05/16/12: Since last visit patient averaging 2 migraine headaches per month. She tried sumatriptan for one headache but it was not effective and she may have taken too late. For another headache she took it and felt some relief. She has felt some jittery tingling sensation with the sumatriptan.  Also patient has developed increasing left posterior neck pain, bilateral shoulder pain. She's tried changing her pillow without relief. She's taking Mobic at nighttime to help her sleep, which is difficult because of her neck pain.  REVIEW OF SYSTEMS: Full 14 system review of systems performed and notable only for fatigue, aching muscles, headache.  ALLERGIES: Allergies  Allergen Reactions  . Vicodin (Hydrocodone-Acetaminophen) Nausea And Vomiting    HOME MEDICATIONS: Outpatient Prescriptions Prior to Visit  Medication Sig Dispense Refill  . meloxicam (MOBIC) 7.5 MG tablet TAKE 1 TABLET BY MOUTH DAILY  30 tablet  5  . Multiple Vitamin (MULTIVITAMIN) tablet Take 1 tablet by mouth daily.      . butalbital-acetaminophen-caffeine (FIORICET) 50-325-40 MG per tablet Take 1-2 tablets by mouth every 6 (six) hours as needed for headache.  30 tablet  0   No facility-administered medications prior to visit.    PAST MEDICAL HISTORY: Past Medical History  Diagnosis Date  . PMS (premenstrual syndrome)   . Acid reflux   . Menstrual migraine   . H/O varicella   . Monilial vaginitis 2005  . Leukorrhea 2006    Physiologic  . Weight gain 2010  . PMS (premenstrual syndrome) 2011  . Ovarian cyst, left 2012  . Herniated disc, cervical     PAST SURGICAL HISTORY: Past Surgical History  Procedure Laterality Date    . Wisdom tooth extraction    . Laparoscopic tubal ligation  02/20/2011    Procedure: LAPAROSCOPIC TUBAL LIGATION;  Surgeon: Esmeralda Arthur, MD;  Location: [facility];  Service: Gynecology;  Laterality: Bilateral;  with Removal of Mirena and Robotic Assisted Bilateral Tubal Ligation  . Robotic assisted gynecologic procedure  02-2011    ovarian cystectomy  . Tubal ligation  02/20/2011    FAMILY HISTORY: Family History  Problem Relation Age of Onset  . Uterine cancer Mother   . Cancer Mother   . Breast cancer Paternal Aunt 60  . Hypertension Father   . Diabetes Father   . Hypertension Brother   . Hypertension Brother     SOCIAL HISTORY:  History   Social History  . Marital Status: Married    Spouse Name: [name]    Number of Children: 2  . Years of Education: 17+   Occupational History  . Clinical Social Work/Outpatient Therapist    Social History Main Topics  . Smoking status: Never Smoker   . Smokeless tobacco: Never Used  . Alcohol Use: No  . Drug Use: No  . Sexually Active: Yes -- Female partner(s)    Birth Control/ Protection: Surgical   Other Topics Concern  . Not on file   Social History Narrative   Lives with husband, almost 50 year old daughter and 47 year old son.     PHYSICAL EXAM  Filed Vitals:   05/16/12 1517  BP: 106/62  Pulse: 78  Temp: 98.6 F (37  C)  TempSrc: Oral  Height: 4\' 11"  (1.499 m)  Weight: 159 lb (72.122 kg)   Body mass index is 32.1 kg/(m^2).  GENERAL EXAM: Patient is in no distress  CARDIOVASCULAR: Regular rate and rhythm, no murmurs, no carotid bruits  NEUROLOGIC: MENTAL STATUS: awake, alert, language fluent, comprehension intact, naming intact CRANIAL NERVE: no papilledema on fundoscopic exam, pupils equal and reactive to light, visual fields full to confrontation, extraocular muscles intact, no nystagmus, facial sensation and strength symmetric, uvula midline, shoulder shrug symmetric, tongue midline. MOTOR: normal bulk and  tone, full strength in the BUE, BLE SENSORY: normal and symmetric to light touch, pinprick, temperature, vibration COORDINATION: finger-nose-finger, fine finger movements normal REFLEXES: deep tendon reflexes TRACE and symmetric GAIT/STATION: narrow based gait   DIAGNOSTIC DATA (LABS, IMAGING, TESTING) - I reviewed patient records, labs, notes, testing and imaging myself where available.  Lab Results  Component Value Date   WBC 4.8 12/31/2011   HGB 13.2 12/31/2011   HCT 36.9 12/31/2011   MCV 72.6* 12/31/2011   PLT 260 12/31/2011      Component Value Date/Time   NA 135 12/31/2011 0928   K 4.1 12/31/2011 0928   CL 103 12/31/2011 0928   CO2 26 12/31/2011 0928   GLUCOSE 97 12/31/2011 0928   BUN 9 12/31/2011 0928   CREATININE 0.73 12/31/2011 0928   CREATININE 0.70 09/11/2011 0211   CALCIUM 9.4 12/31/2011 0928   PROT 6.3 12/31/2011 0928   ALBUMIN 4.2 12/31/2011 0928   AST 14 12/31/2011 0928   ALT 10 12/31/2011 0928   ALKPHOS 38* 12/31/2011 0928   BILITOT 0.4 12/31/2011 0928   Lab Results  Component Value Date   CHOL 205* 12/31/2011   HDL 61 12/31/2011   LDLCALC 127* 12/31/2011   TRIG 83 12/31/2011   CHOLHDL 3.4 12/31/2011   No results found for this basename: HGBA1C   No results found for this basename: VITAMINB12   No results found for this basename: TSH   02/02/13 MRI brain - normal   ASSESSMENT AND PLAN  42 y.o. year old female  has a past medical history of PMS (premenstrual syndrome); Acid reflux; Menstrual migraine; H/O varicella; Monilial vaginitis (2005); Leukorrhea (2006); Weight gain (2010); PMS (premenstrual syndrome) (2011); Ovarian cyst, left (2012); and Herniated disc, cervical. here with migraine headaches and neck pain. 2 migraines per month. More trouble with neck pain and sleep difficulty.  Will try gabapentin 300mg  at bedtime. Continue sumatriptan.   Alexis Marker, MD 05/16/2012, 4:04 PM Certified in Neurology, Neurophysiology and  Neuroimaging  [facility] [address] [place], [place] [address] [phone]

## 2012-07-09 ENCOUNTER — Ambulatory Visit (INDEPENDENT_AMBULATORY_CARE_PROVIDER_SITE_OTHER): Payer: Managed Care, Other (non HMO) | Admitting: Emergency Medicine

## 2012-07-09 VITALS — BP 116/82 | HR 102 | Temp 98.2°F | Resp 18 | Ht 59.0 in | Wt 157.0 lb

## 2012-07-09 DIAGNOSIS — G43909 Migraine, unspecified, not intractable, without status migrainosus: Secondary | ICD-10-CM

## 2012-07-09 DIAGNOSIS — G43109 Migraine with aura, not intractable, without status migrainosus: Secondary | ICD-10-CM

## 2012-07-09 MED ORDER — SERTRALINE HCL 50 MG PO TABS: 50.0000 mg | ORAL_TABLET | Freq: Every day | ORAL | Status: DC

## 2012-07-09 MED ORDER — ELETRIPTAN HYDROBROMIDE 40 MG PO TABS: 40.0000 mg | ORAL_TABLET | ORAL | Status: DC | PRN

## 2012-07-09 NOTE — Progress Notes (Signed)
[facility] [address] [place] [address] [phone]  Date:  07/09/2012   Name:  [name]   DOB:  [birth year]   MRN:  [id number]  PCP:  Pcp Not In System    Chief Complaint: Migraine, Anxiety and Stress   History of Present Illness:  [name] is a 41 y.o. very pleasant female patient who presents with the following:  History of migraines since last year.  Takes imitrex that is most effective early and abates the headache.  When at work is unable to take the imitrex.  She has headaches usually once or twice a month and now is having a headache as often as three times a week.  She attributes this change to increased stress levels.  Has tried gabapentin and says that made her tired.  She has a borderline low pressure and a low tolerance for sedation.  Has been worked up by neurology with CT and MRI.  No improvement with over the counter medications or other home remedies. Denies other complaint or health concern today.   Patient Active Problem List   Diagnosis Date Noted  . Cervicalgia 05/16/2012  . Herniated disc, cervical   . Migraine 12/31/2011  . Left ovarian cyst 02/20/2011    Past Medical History  Diagnosis Date  . PMS (premenstrual syndrome)   . Acid reflux   . Menstrual migraine   . H/O varicella   . Monilial vaginitis 2005  . Leukorrhea 2006    Physiologic  . Weight gain 2010  . PMS (premenstrual syndrome) 2011  . Ovarian cyst, left 2012  . Herniated disc, cervical   . Neuromuscular disorder   . Anxiety     Past Surgical History  Procedure Laterality Date  . Wisdom tooth extraction    . Laparoscopic tubal ligation  02/20/2011    Procedure: LAPAROSCOPIC TUBAL LIGATION;  Surgeon: Esmeralda Arthur, MD;  Location: [facility];  Service: Gynecology;  Laterality: Bilateral;  with Removal of Mirena and Robotic Assisted Bilateral Tubal Ligation  . Robotic assisted gynecologic procedure  02-2011    ovarian cystectomy  . Tubal ligation   02/20/2011    History  Substance Use Topics  . Smoking status: Never Smoker   . Smokeless tobacco: Never Used  . Alcohol Use: No    Family History  Problem Relation Age of Onset  . Uterine cancer Mother   . Cancer Mother   . Breast cancer Paternal Aunt 52  . Hypertension Father   . Diabetes Father   . Hypertension Brother   . Hypertension Brother     Allergies  Allergen Reactions  . Vicodin (Hydrocodone-Acetaminophen) Nausea And Vomiting    Medication list has been reviewed and updated.  Current Outpatient Prescriptions on File Prior to Visit  Medication Sig Dispense Refill  . meloxicam (MOBIC) 7.5 MG tablet TAKE 1 TABLET BY MOUTH DAILY  30 tablet  5  . Multiple Vitamin (MULTIVITAMIN) tablet Take 1 tablet by mouth daily.      . SUMAtriptan (IMITREX) 100 MG tablet Take 100 mg by mouth as needed for migraine.       No current facility-administered medications on file prior to visit.    Review of Systems:  As per HPI, otherwise negative.    Physical Examination: Filed Vitals:   07/09/12 1652  BP: 116/82  Pulse: 102  Temp: 98.2 F (36.8 C)  Resp: 18   Filed Vitals:   07/09/12 1652  Height: 4\' 11"  (1.499 m)  Weight: 157 lb (71.215 kg)   Body mass index is 31.69 kg/(m^2). Ideal Body Weight: Weight in (lb) to have BMI = 25: 123.5  GEN: WDWN, NAD, Non-toxic, A & O x 3 HEENT: Atraumatic, Normocephalic. Neck supple. No masses, No LAD. Ears and Nose: No external deformity. CV: RRR, No M/G/R. No JVD. No thrill. No extra heart sounds. PULM: CTA B, no wheezes, crackles, rhonchi. No retractions. No resp. distress. No accessory muscle use. ABD: S, NT, ND, +BS. No rebound. No HSM. EXTR: No c/c/e NEURO Normal gait.  PSYCH: Normally interactive. Conversant. Not depressed or anxious appearing.  Calm demeanor.    Assessment and Plan: Migraines Anxiety  zoloft Change from imitrex to relpax Follow up with neurology.  Signed,  Phillips Odor, MD

## 2012-07-09 NOTE — Patient Instructions (Signed)
Migraine Headache A migraine headache is an intense, throbbing pain on one or both sides of your head. A migraine can last for 30 minutes to several hours. CAUSES  The exact cause of a migraine headache is not always known. However, a migraine may be caused when nerves in the brain become irritated and release chemicals that cause inflammation. This causes pain. SYMPTOMS  Pain on one or both sides of your head.  Pulsating or throbbing pain.  Severe pain that prevents daily activities.  Pain that is aggravated by any physical activity.  Nausea, vomiting, or both.  Dizziness.  Pain with exposure to bright lights, loud noises, or activity.  General sensitivity to bright lights, loud noises, or smells. Before you get a migraine, you may get warning signs that a migraine is coming (aura). An aura may include:  Seeing flashing lights.  Seeing bright spots, halos, or zig-zag lines.  Having tunnel vision or blurred vision.  Having feelings of numbness or tingling.  Having trouble talking.  Having muscle weakness. MIGRAINE TRIGGERS  Alcohol.  Smoking.  Stress.  Menstruation.  Aged cheeses.  Foods or drinks that contain nitrates, glutamate, aspartame, or tyramine.  Lack of sleep.  Chocolate.  Caffeine.  Hunger.  Physical exertion.  Fatigue.  Medicines used to treat chest pain (nitroglycerine), birth control pills, estrogen, and some blood pressure medicines. DIAGNOSIS  A migraine headache is often diagnosed based on:  Symptoms.  Physical examination.  A CT scan or MRI of your head. TREATMENT Medicines may be given for pain and nausea. Medicines can also be given to help prevent recurrent migraines.  HOME CARE INSTRUCTIONS  Only take over-the-counter or prescription medicines for pain or discomfort as directed by your caregiver. The use of long-term narcotics is not recommended.  Lie down in a dark, quiet room when you have a migraine.  Keep a journal  to find out what may trigger your migraine headaches. For example, write down:  What you eat and drink.  How much sleep you get.  Any change to your diet or medicines.  Limit alcohol consumption.  Quit smoking if you smoke.  Get 7 to 9 hours of sleep, or as recommended by your caregiver.  Limit stress.  Keep lights dim if bright lights bother you and make your migraines worse. SEEK IMMEDIATE MEDICAL CARE IF:   Your migraine becomes severe.  You have a fever.  You have a stiff neck.  You have vision loss.  You have muscular weakness or loss of muscle control.  You start losing your balance or have trouble walking.  You feel faint or pass out.  You have severe symptoms that are different from your first symptoms. MAKE SURE YOU:   Understand these instructions.  Will watch your condition.  Will get help right away if you are not doing well or get worse. Document Released: 02/02/2005 Document Revised: 04/27/2011 Document Reviewed: 01/23/2011 ExitCare Patient Information 2014 [employer].  

## 2012-07-13 NOTE — Progress Notes (Signed)
PA approved for Relpax through 01/09/13. Faxed to pharmacy.

## 2012-11-29 ENCOUNTER — Ambulatory Visit: Payer: Managed Care, Other (non HMO) | Admitting: Diagnostic Neuroimaging

## 2013-03-16 ENCOUNTER — Encounter: Payer: Managed Care, Other (non HMO) | Admitting: Physician Assistant

## 2013-03-31 ENCOUNTER — Ambulatory Visit (INDEPENDENT_AMBULATORY_CARE_PROVIDER_SITE_OTHER): Payer: Managed Care, Other (non HMO) | Admitting: Emergency Medicine

## 2013-03-31 VITALS — BP 120/68 | HR 77 | Temp 97.8°F | Resp 18 | Ht 59.5 in | Wt 168.0 lb

## 2013-03-31 DIAGNOSIS — J45909 Unspecified asthma, uncomplicated: Secondary | ICD-10-CM

## 2013-03-31 MED ORDER — MOMETASONE FURO-FORMOTEROL FUM 100-5 MCG/ACT IN AERO: 2.0000 | INHALATION_SPRAY | Freq: Two times a day (BID) | RESPIRATORY_TRACT | Status: DC

## 2013-03-31 MED ORDER — IPRATROPIUM BROMIDE HFA 17 MCG/ACT IN AERS: 2.0000 | INHALATION_SPRAY | RESPIRATORY_TRACT | Status: DC | PRN

## 2013-03-31 NOTE — Progress Notes (Signed)
[facility] [address] [place] [address] [phone]  Date:  03/31/2013   Name:  [name]   DOB:  1970/08/15   MRN:  [id number]  PCP:  Pcp Not In System    Chief Complaint: Shortness of Breath, Headache, Bronchitis and Fatigue   History of Present Illness:  [name] is a 43 y.o. very pleasant female patient who presents with the following:  Has cough over past few days.  Some wheezing.  No shortness of breath. No coryza. No fever or chills.  Sporadically treating herself with albuterol MDI that is expired. Does not want to use her MDI as she gets a severe headache.  No improvement with over the counter medications or other home remedies. Denies other complaint or health concern today.   Patient Active Problem List   Diagnosis Date Noted  . Cervicalgia 05/16/2012  . Herniated disc, cervical   . Migraine 12/31/2011  . Left ovarian cyst 02/20/2011    Past Medical History  Diagnosis Date  . PMS (premenstrual syndrome)   . Acid reflux   . Menstrual migraine   . H/O varicella   . Monilial vaginitis 2005  . Leukorrhea 2006    Physiologic  . Weight gain 2010  . PMS (premenstrual syndrome) 2011  . Ovarian cyst, left 2012  . Herniated disc, cervical   . Neuromuscular disorder   . Anxiety     Past Surgical History  Procedure Laterality Date  . Wisdom tooth extraction    . Laparoscopic tubal ligation  02/20/2011    Procedure: LAPAROSCOPIC TUBAL LIGATION;  Surgeon: Alwyn Pea, MD;  Location: [facility];  Service: Gynecology;  Laterality: Bilateral;  with Removal of Mirena and Robotic Assisted Bilateral Tubal Ligation  . Robotic assisted gynecologic procedure  02-2011    ovarian cystectomy  . Tubal ligation  02/20/2011    History  Substance Use Topics  . Smoking status: Never Smoker   . Smokeless tobacco: Never Used  . Alcohol Use: No    Family History  Problem Relation Age of Onset  . Uterine cancer Mother   . Cancer Mother   .  Breast cancer Paternal Aunt 22  . Hypertension Father   . Diabetes Father   . Hypertension Brother   . Hypertension Brother     Allergies  Allergen Reactions  . Vicodin [Hydrocodone-Acetaminophen] Nausea And Vomiting    Medication list has been reviewed and updated.  Current Outpatient Prescriptions on File Prior to Visit  Medication Sig Dispense Refill  . meloxicam (MOBIC) 7.5 MG tablet TAKE 1 TABLET BY MOUTH DAILY  30 tablet  5  . Multiple Vitamin (MULTIVITAMIN) tablet Take 1 tablet by mouth daily.      Marland Kitchen eletriptan (RELPAX) 40 MG tablet One tablet by mouth at onset of headache. May repeat in 2 hours if headache persists or recurs. may repeat in 2 hours if necessary  10 tablet  2  . sertraline (ZOLOFT) 50 MG tablet Take 1 tablet (50 mg total) by mouth daily.  30 tablet  3  . SUMAtriptan (IMITREX) 100 MG tablet Take 100 mg by mouth as needed for migraine.       No current facility-administered medications on file prior to visit.    Review of Systems:  As per HPI, otherwise negative.    Physical Examination: Filed Vitals:   03/31/13 1304  BP: 120/68  Pulse: 77  Temp: 97.8 F (36.6 C)  Resp: 18   Filed Vitals:  03/31/13 1304  Height: 4' 11.5" (1.511 m)  Weight: 168 lb (76.204 kg)   Body mass index is 33.38 kg/(m^2). Ideal Body Weight: Weight in (lb) to have BMI = 25: 125.6  GEN: WDWN, NAD, Non-toxic, A & O x 3 HEENT: Atraumatic, Normocephalic. Neck supple. No masses, No LAD. Ears and Nose: No external deformity. CV: RRR, No M/G/R. No JVD. No thrill. No extra heart sounds. PULM: CTA B, no wheezes, crackles, rhonchi. No retractions. No resp. distress. No accessory muscle use. ABD: S, NT, ND, +BS. No rebound. No HSM. EXTR: No c/c/e NEURO Normal gait.  PSYCH: Normally interactive. Conversant. Not depressed or anxious appearing.  Calm demeanor.    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   Doubtful atrovent  Signed,  Ellison Carwin, MD

## 2013-03-31 NOTE — Patient Instructions (Signed)
aAsthma Attack Prevention Although there is no way to prevent asthma from starting, you can take steps to control the disease and reduce its symptoms. Learn about your asthma and how to control it. Take an active role to control your asthma by working with your health care provider to create and follow an asthma action plan. An asthma action plan guides you in:  Taking your medicines properly.  Avoiding things that set off your asthma or make your asthma worse (asthma triggers).  Tracking your level of asthma control.  Responding to worsening asthma.  Seeking emergency care when needed. To track your asthma, keep records of your symptoms, check your peak flow number using a handheld device that shows how well air moves out of your lungs (peak flow meter), and get regular asthma checkups.  WHAT ARE SOME WAYS TO PREVENT AN ASTHMA ATTACK?  Take medicines as directed by your health care provider.  Keep track of your asthma symptoms and level of control.  With your health care provider, write a detailed plan for taking medicines and managing an asthma attack. Then be sure to follow your action plan. Asthma is an ongoing condition that needs regular monitoring and treatment.  Identify and avoid asthma triggers. Many outdoor allergens and irritants (such as pollen, mold, cold air, and air pollution) can trigger asthma attacks. Find out what your asthma triggers are and take steps to avoid them.  Monitor your breathing. Learn to recognize warning signs of an attack, such as coughing, wheezing, or shortness of breath. Your lung function may decrease before you notice any signs or symptoms, so regularly measure and record your peak airflow with a home peak flow meter.  Identify and treat attacks early. If you act quickly, you are less likely to have a severe attack. You will also need less medicine to control your symptoms. When your peak flow measurements decrease and alert you to an upcoming attack,  take your medicine as instructed and immediately stop any activity that may have triggered the attack. If your symptoms do not improve, get medical help.  Pay attention to increasing quick-relief inhaler use. If you find yourself relying on your quick-relief inhaler, your asthma is not under control. See your health care provider about adjusting your treatment. WHAT CAN MAKE MY SYMPTOMS WORSE? A number of common things can set off or make your asthma symptoms worse and cause temporary increased inflammation of your airways. Keep track of your asthma symptoms for several weeks, detailing all the environmental and emotional factors that are linked with your asthma. When you have an asthma attack, go back to your asthma diary to see which factor, or combination of factors, might have contributed to it. Once you know what these factors are, you can take steps to control many of them. If you have allergies and asthma, it is important to take asthma prevention steps at home. Minimizing contact with the substance to which you are allergic will help prevent an asthma attack. Some triggers and ways to avoid these triggers are: Animal Dander:  Some people are allergic to the flakes of skin or dried saliva from animals with fur or feathers.   There is no such thing as a hypoallergenic dog or cat breed. All dogs or cats can cause allergies, even if they don't shed.  Keep these pets out of your home.  If you are not able to keep a pet outdoors, keep the pet out of your bedroom and other sleeping areas at all  times, and keep the door closed.  Remove carpets and furniture covered with cloth from your home. If that is not possible, keep the pet away from fabric-covered furniture and carpets. Dust Mites: Many people with asthma are allergic to dust mites. Dust mites are tiny bugs that are found in every home in mattresses, pillows, carpets, fabric-covered furniture, bedcovers, clothes, stuffed toys, and other  fabric-covered items.   Cover your mattress in a special dust-proof cover.  Cover your pillow in a special dust-proof cover, or wash the pillow each week in hot water. Water must be hotter than 130 F (54.4 C) to kill dust mites. Cold or warm water used with detergent and bleach can also be effective.  Wash the sheets and blankets on your bed each week in hot water.  Try not to sleep or lie on cloth-covered cushions.  Call ahead when traveling and ask for a smoke-free hotel room. Bring your own bedding and pillows in case the hotel only supplies feather pillows and down comforters, which may contain dust mites and cause asthma symptoms.  Remove carpets from your bedroom and those laid on concrete, if you can.  Keep stuffed toys out of the bed, or wash the toys weekly in hot water or cooler water with detergent and bleach. Cockroaches: Many people with asthma are allergic to the droppings and remains of cockroaches.   Keep food and garbage in closed containers. Never leave food out.  Use poison baits, traps, powders, gels, or paste (for example, boric acid).  If a spray is used to kill cockroaches, stay out of the room until the odor goes away. Indoor Mold:  Fix leaky faucets, pipes, or other sources of water that have mold around them.  Clean floors and moldy surfaces with a fungicide or diluted bleach.  Avoid using humidifiers, vaporizers, or swamp coolers. These can spread molds through the air. Pollen and Outdoor Mold:  When pollen or mold spore counts are high, try to keep your windows closed.  Stay indoors with windows closed from late morning to afternoon. Pollen and some mold spore counts are highest at that time.  Ask your health care provider whether you need to take anti-inflammatory medicine or increase your dose of the medicine before your allergy season starts. Other Irritants to Avoid:  Tobacco smoke is an irritant. If you smoke, ask your health care provider how  you can quit. Ask family members to quit smoking too. Do not allow smoking in your home or car.  If possible, do not use a wood-burning stove, kerosene heater, or fireplace. Minimize exposure to all sources of smoke, including to incense, candles, fires, and fireworks.  Try to stay away from strong odors and sprays, such as perfume, talcum powder, hair spray, and paints.  Decrease humidity in your home and use an indoor air cleaning device. Reduce indoor humidity to below 60%. Dehumidifiers or central air conditioners can do this.  Decrease house dust exposure by changing furnace and air cooler filters frequently.  Try to have someone else vacuum for you once or twice a week. Stay out of rooms while they are being vacuumed and for a short while afterward.  If you vacuum, use a dust mask from a hardware store, a double-layered or microfilter vacuum cleaner bag, or a vacuum cleaner with a HEPA filter.  Sulfites in foods and beverages can be irritants. Do not drink beer or wine or eat dried fruit, processed potatoes, or shrimp if they cause asthma symptoms.    Cold air can trigger an asthma attack. Cover your nose and mouth with a scarf on cold or windy days.  Several health conditions can make asthma more difficult to manage, including a runny nose, sinus infections, reflux disease, psychological stress, and sleep apnea. Work with your health care provider to manage these conditions.  Avoid close contact with people who have a respiratory infection such as a cold or the flu, since your asthma symptoms may get worse if you catch the infection. Wash your hands thoroughly after touching items that may have been handled by people with a respiratory infection.  Get a flu shot every year to protect against the flu virus, which often makes asthma worse for days or weeks. Also get a pneumonia shot if you have not previously had one. Unlike the flu shot, the pneumonia shot does not need to be given  yearly. Medicines:  Talk to your health care provider about whether it is safe for you to take aspirin or non-steroidal anti-inflammatory medicines (NSAIDs). In a small number of people with asthma, aspirin and NSAIDs can cause asthma attacks. These medicines must be avoided by people who have known aspirin-sensitive asthma. It is important that people with aspirin-sensitive asthma read labels of all over-the-counter medicines used to treat pain, colds, coughs, and fever.  Beta blockers and ACE inhibitors are other medicines you should discuss with your health care provider. HOW CAN I FIND OUT WHAT I AM ALLERGIC TO? Ask your asthma health care provider about allergy skin testing or blood testing (the RAST test) to identify the allergens to which you are sensitive. If you are found to have allergies, the most important thing to do is to try to avoid exposure to any allergens that you are sensitive to as much as possible. Other treatments for allergies, such as medicines and allergy shots (immunotherapy) are available.  CAN I EXERCISE? Follow your health care provider's advice regarding asthma treatment before exercising. It is important to maintain a regular exercise program, but vigorous exercise, or exercise in cold, humid, or dry environments can cause asthma attacks, especially for those people who have exercise-induced asthma. Document Released: 01/21/2009 Document Revised: 10/05/2012 Document Reviewed: 08/10/2012 [employer] Patient Information 2014 [employer].  

## 2013-08-06 ENCOUNTER — Other Ambulatory Visit: Payer: Self-pay | Admitting: Physician Assistant

## 2013-08-17 ENCOUNTER — Inpatient Hospital Stay (HOSPITAL_COMMUNITY)
Admission: EM | Admit: 2013-08-17 | Discharge: 2013-08-22 | DRG: 282 | Disposition: A | Discharge status: Home / Self Care | Payer: Managed Care, Other (non HMO) | Attending: Interventional Cardiology | Admitting: Interventional Cardiology

## 2013-08-17 ENCOUNTER — Ambulatory Visit (HOSPITAL_COMMUNITY): Admit: 2013-08-17 | Payer: Managed Care, Other (non HMO) | Admitting: Interventional Cardiology

## 2013-08-17 ENCOUNTER — Encounter (HOSPITAL_COMMUNITY)
Admission: EM | Disposition: A | Discharge status: Home / Self Care | Payer: Managed Care, Other (non HMO) | Source: Home / Self Care | Attending: Interventional Cardiology

## 2013-08-17 DIAGNOSIS — G43909 Migraine, unspecified, not intractable, without status migrainosus: Secondary | ICD-10-CM | POA: Diagnosis present

## 2013-08-17 DIAGNOSIS — R911 Solitary pulmonary nodule: Secondary | ICD-10-CM | POA: Diagnosis present

## 2013-08-17 DIAGNOSIS — Z8049 Family history of malignant neoplasm of other genital organs: Secondary | ICD-10-CM

## 2013-08-17 DIAGNOSIS — I213 ST elevation (STEMI) myocardial infarction of unspecified site: Secondary | ICD-10-CM

## 2013-08-17 DIAGNOSIS — I2542 Coronary artery dissection: Principal | ICD-10-CM | POA: Diagnosis present

## 2013-08-17 DIAGNOSIS — K219 Gastro-esophageal reflux disease without esophagitis: Secondary | ICD-10-CM | POA: Diagnosis present

## 2013-08-17 DIAGNOSIS — M5020 Other cervical disc displacement, unspecified cervical region: Secondary | ICD-10-CM | POA: Diagnosis present

## 2013-08-17 DIAGNOSIS — E876 Hypokalemia: Secondary | ICD-10-CM | POA: Diagnosis present

## 2013-08-17 DIAGNOSIS — N83202 Unspecified ovarian cyst, left side: Secondary | ICD-10-CM | POA: Diagnosis present

## 2013-08-17 DIAGNOSIS — R079 Chest pain, unspecified: Secondary | ICD-10-CM

## 2013-08-17 DIAGNOSIS — N83209 Unspecified ovarian cyst, unspecified side: Secondary | ICD-10-CM | POA: Diagnosis present

## 2013-08-17 DIAGNOSIS — M542 Cervicalgia: Secondary | ICD-10-CM | POA: Diagnosis present

## 2013-08-17 DIAGNOSIS — I2109 ST elevation (STEMI) myocardial infarction involving other coronary artery of anterior wall: Secondary | ICD-10-CM | POA: Diagnosis present

## 2013-08-17 DIAGNOSIS — I519 Heart disease, unspecified: Secondary | ICD-10-CM

## 2013-08-17 DIAGNOSIS — F411 Generalized anxiety disorder: Secondary | ICD-10-CM | POA: Diagnosis present

## 2013-08-17 DIAGNOSIS — I2589 Other forms of chronic ischemic heart disease: Secondary | ICD-10-CM | POA: Diagnosis present

## 2013-08-17 DIAGNOSIS — Z885 Allergy status to narcotic agent status: Secondary | ICD-10-CM

## 2013-08-17 DIAGNOSIS — Z8249 Family history of ischemic heart disease and other diseases of the circulatory system: Secondary | ICD-10-CM

## 2013-08-17 DIAGNOSIS — N943 Premenstrual tension syndrome: Secondary | ICD-10-CM | POA: Diagnosis present

## 2013-08-17 DIAGNOSIS — I959 Hypotension, unspecified: Secondary | ICD-10-CM | POA: Diagnosis present

## 2013-08-17 DIAGNOSIS — K7689 Other specified diseases of liver: Secondary | ICD-10-CM | POA: Diagnosis present

## 2013-08-17 DIAGNOSIS — Z9851 Tubal ligation status: Secondary | ICD-10-CM

## 2013-08-17 DIAGNOSIS — Z803 Family history of malignant neoplasm of breast: Secondary | ICD-10-CM

## 2013-08-17 DIAGNOSIS — Z833 Family history of diabetes mellitus: Secondary | ICD-10-CM

## 2013-08-17 DIAGNOSIS — G43009 Migraine without aura, not intractable, without status migrainosus: Secondary | ICD-10-CM

## 2013-08-17 HISTORY — DX: Solitary pulmonary nodule: R91.1

## 2013-08-17 HISTORY — DX: Coronary artery dissection: I25.42

## 2013-08-17 HISTORY — PX: LEFT HEART CATHETERIZATION WITH CORONARY ANGIOGRAM: SHX5451

## 2013-08-17 LAB — CBC
HCT: 37 % (ref 36.0–46.0)
Hemoglobin: 12.9 g/dL (ref 12.0–15.0)
MCH: 25.5 pg — ABNORMAL LOW (ref 26.0–34.0)
MCHC: 34.9 g/dL (ref 30.0–36.0)
MCV: 73.3 fL — ABNORMAL LOW (ref 78.0–100.0)
PLATELETS: 308 10*3/uL (ref 150–400)
RBC: 5.05 MIL/uL (ref 3.87–5.11)
RDW: 14.1 % (ref 11.5–15.5)
WBC: 14.9 10*3/uL — AB (ref 4.0–10.5)

## 2013-08-17 LAB — COMPREHENSIVE METABOLIC PANEL
ALT: 11 U/L (ref 0–35)
AST: 19 U/L (ref 0–37)
Albumin: 3.9 g/dL (ref 3.5–5.2)
Alkaline Phosphatase: 44 U/L (ref 39–117)
Anion gap: 21 — ABNORMAL HIGH (ref 5–15)
BUN: 10 mg/dL (ref 6–23)
CHLORIDE: 98 meq/L (ref 96–112)
CO2: 19 meq/L (ref 19–32)
Calcium: 9.1 mg/dL (ref 8.4–10.5)
Creatinine, Ser: 0.78 mg/dL (ref 0.50–1.10)
GFR calc Af Amer: >90 mL/min (ref >90)
Glucose, Bld: 179 mg/dL — ABNORMAL HIGH (ref 70–99)
Potassium: 3.4 mEq/L — ABNORMAL LOW (ref 3.7–5.3)
SODIUM: 138 meq/L (ref 137–147)
Total Protein: 7 g/dL (ref 6.0–8.3)

## 2013-08-17 LAB — LIPID PANEL
Cholesterol: 184 mg/dL (ref 0–200)
HDL: 64 mg/dL (ref >39)
LDL CALC: 112 mg/dL — AB (ref 0–99)
Total CHOL/HDL Ratio: 2.9 RATIO
Triglycerides: 41 mg/dL (ref <150)
VLDL: 8 mg/dL (ref 0–40)

## 2013-08-17 LAB — POCT I-STAT TROPONIN I: Troponin i, poc: 0.14 ng/mL (ref 0.00–0.08)

## 2013-08-17 LAB — CK TOTAL AND CKMB (NOT AT ARMC)
CK, MB: 29.3 ng/mL (ref 0.3–4.0)
Relative Index: 6.4 — ABNORMAL HIGH (ref 0.0–2.5)
Total CK: 459 U/L — ABNORMAL HIGH (ref 7–177)

## 2013-08-17 LAB — POCT I-STAT, CHEM 8
BUN: 9 mg/dL (ref 6–23)
Calcium, Ion: 1.17 mmol/L (ref 1.12–1.23)
Chloride: 102 mEq/L (ref 96–112)
Creatinine, Ser: 0.8 mg/dL (ref 0.50–1.10)
Glucose, Bld: 177 mg/dL — ABNORMAL HIGH (ref 70–99)
HEMATOCRIT: 41 % (ref 36.0–46.0)
HEMOGLOBIN: 13.9 g/dL (ref 12.0–15.0)
POTASSIUM: 3.2 meq/L — AB (ref 3.7–5.3)
Sodium: 140 mEq/L (ref 137–147)
TCO2: 20 mmol/L (ref 0–100)

## 2013-08-17 LAB — APTT: aPTT: 28 seconds (ref 24–37)

## 2013-08-17 LAB — TROPONIN I: TROPONIN I: 5.69 ng/mL — AB (ref <0.30)

## 2013-08-17 LAB — PROTIME-INR
INR: 0.96 (ref 0.00–1.49)
Prothrombin Time: 12.8 seconds (ref 11.6–15.2)

## 2013-08-17 SURGERY — LEFT HEART CATHETERIZATION WITH CORONARY ANGIOGRAM
Anesthesia: Choice | Laterality: Bilateral

## 2013-08-17 MED ORDER — SIMVASTATIN 20 MG PO TABS
20.0000 mg | ORAL_TABLET | Freq: Every day | ORAL | Status: DC
  Administered 2013-08-18: 20 mg via ORAL
  Filled 2013-08-17 (×6): qty 1

## 2013-08-17 MED ORDER — SERTRALINE HCL 50 MG PO TABS
50.0000 mg | ORAL_TABLET | Freq: Every day | ORAL | Status: DC
  Administered 2013-08-18 – 2013-08-20 (×3): 50 mg via ORAL
  Filled 2013-08-17 (×5): qty 1

## 2013-08-17 MED ORDER — METOPROLOL TARTRATE 12.5 MG HALF TABLET
12.5000 mg | ORAL_TABLET | Freq: Three times a day (TID) | ORAL | Status: DC
  Administered 2013-08-17 – 2013-08-19 (×5): 12.5 mg via ORAL
  Filled 2013-08-17 (×10): qty 1

## 2013-08-17 MED ORDER — LIDOCAINE HCL (PF) 1 % IJ SOLN
INTRAMUSCULAR | Status: AC
  Filled 2013-08-17: qty 30

## 2013-08-17 MED ORDER — ONDANSETRON HCL 4 MG/2ML IJ SOLN: 4.0000 mg | Freq: Four times a day (QID) | INTRAMUSCULAR | Status: DC | PRN

## 2013-08-17 MED ORDER — NITROGLYCERIN IN D5W 200-5 MCG/ML-% IV SOLN
2.0000 ug/min | INTRAVENOUS | Status: DC
  Administered 2013-08-17: 5 ug/min via INTRAVENOUS
  Administered 2013-08-18: 80 ug/min via INTRAVENOUS
  Administered 2013-08-18: 100 ug/min via INTRAVENOUS
  Filled 2013-08-17 (×3): qty 250

## 2013-08-17 MED ORDER — POTASSIUM CHLORIDE IN NACL 20-0.45 MEQ/L-% IV SOLN
INTRAVENOUS | Status: DC
  Administered 2013-08-18: via INTRAVENOUS
  Filled 2013-08-17 (×8): qty 1000

## 2013-08-17 MED ORDER — NITROGLYCERIN 0.2 MG/ML ON CALL CATH LAB
INTRAVENOUS | Status: AC
  Filled 2013-08-17: qty 1

## 2013-08-17 MED ORDER — HEPARIN SODIUM (PORCINE) 5000 UNIT/ML IJ SOLN
4000.0000 [IU] | INTRAMUSCULAR | Status: AC
  Filled 2013-08-17: qty 0.8

## 2013-08-17 MED ORDER — ALPRAZOLAM 0.25 MG PO TABS: 0.2500 mg | ORAL_TABLET | Freq: Three times a day (TID) | ORAL | Status: DC | PRN

## 2013-08-17 MED ORDER — ONDANSETRON HCL 4 MG/2ML IJ SOLN
4.0000 mg | Freq: Four times a day (QID) | INTRAMUSCULAR | Status: DC | PRN
  Administered 2013-08-18 (×2): 4 mg via INTRAVENOUS
  Filled 2013-08-17 (×2): qty 2

## 2013-08-17 MED ORDER — BIVALIRUDIN 250 MG IV SOLR
INTRAVENOUS | Status: AC
  Filled 2013-08-17: qty 250

## 2013-08-17 MED ORDER — ASPIRIN EC 325 MG PO TBEC: 325.0000 mg | DELAYED_RELEASE_TABLET | Freq: Every day | ORAL | Status: DC

## 2013-08-17 MED ORDER — NITROGLYCERIN 0.4 MG SL SUBL
0.4000 mg | SUBLINGUAL_TABLET | SUBLINGUAL | Status: DC | PRN
Start: 2013-08-17 — End: 2013-08-22

## 2013-08-17 MED ORDER — MIDAZOLAM HCL 2 MG/2ML IJ SOLN
INTRAMUSCULAR | Status: AC
  Filled 2013-08-17: qty 2

## 2013-08-17 MED ORDER — ACETAMINOPHEN 325 MG PO TABS: 650.0000 mg | ORAL_TABLET | ORAL | Status: DC | PRN

## 2013-08-17 MED ORDER — HEPARIN (PORCINE) IN NACL 2-0.9 UNIT/ML-% IJ SOLN
INTRAMUSCULAR | Status: AC
  Filled 2013-08-17: qty 1000

## 2013-08-17 MED ORDER — HEPARIN SODIUM (PORCINE) 5000 UNIT/ML IJ SOLN
5000.0000 [IU] | Freq: Three times a day (TID) | INTRAMUSCULAR | Status: DC
  Administered 2013-08-18 – 2013-08-22 (×13): 5000 [IU] via SUBCUTANEOUS
  Filled 2013-08-17 (×17): qty 1

## 2013-08-17 MED ORDER — FENTANYL CITRATE 0.05 MG/ML IJ SOLN
INTRAMUSCULAR | Status: AC
  Filled 2013-08-17: qty 2

## 2013-08-17 MED ORDER — NITROGLYCERIN IN D5W 200-5 MCG/ML-% IV SOLN
INTRAVENOUS | Status: AC
  Administered 2013-08-18: 80 ug/min via INTRAVENOUS
  Filled 2013-08-17: qty 250

## 2013-08-17 MED ORDER — ASPIRIN EC 325 MG PO TBEC
325.0000 mg | DELAYED_RELEASE_TABLET | Freq: Every day | ORAL | Status: DC
  Administered 2013-08-18 – 2013-08-22 (×4): 325 mg via ORAL
  Filled 2013-08-17 (×4): qty 1

## 2013-08-17 MED ORDER — SODIUM CHLORIDE 0.9 % IV SOLN
INTRAVENOUS | Status: DC
  Administered 2013-08-17: 23:00:00 via INTRAVENOUS

## 2013-08-17 MED ORDER — ALPRAZOLAM 0.25 MG PO TABS
0.2500 mg | ORAL_TABLET | Freq: Three times a day (TID) | ORAL | Status: DC | PRN
  Administered 2013-08-17 – 2013-08-18 (×2): 0.25 mg via ORAL
  Filled 2013-08-17 (×2): qty 1

## 2013-08-17 MED ORDER — METOPROLOL TARTRATE 1 MG/ML IV SOLN
INTRAVENOUS | Status: AC
  Filled 2013-08-17: qty 5

## 2013-08-17 MED ORDER — ACETAMINOPHEN 325 MG PO TABS
650.0000 mg | ORAL_TABLET | ORAL | Status: DC | PRN
  Administered 2013-08-18 – 2013-08-19 (×3): 650 mg via ORAL
  Filled 2013-08-17 (×3): qty 2

## 2013-08-17 NOTE — ED Notes (Signed)
Husband at bedside.  

## 2013-08-17 NOTE — H&P (Addendum)
History and Physical  Patient ID: [name] MRN: [id number], SOB: Aug 15, 1970 43 y.o. Date of Encounter: 08/17/2013, 9:34 PM  Primary Physician: urgent care Primary Cardiologist: non  Chief Complaint: chest pain  HPI: 43 y.o. female w/ no cardiac history, h/o migraines who presented to [facility] on 08/17/2013 with complaints of substernal chest pain. Activated STEMI by EMS. She reports sudden onset of chest pain at 8 pm while running up the stairs. Described as central chest, radiating to her back, pressure sensation. Associate with shortness of breath, mild diaphoresis. Has been constant since then. Bilateral arm numbness but this has been ongoing due to her cervicalgia.  Has also had neck pain for several days due to cervical disk disease. Has been taking mobic for this pain. Reports last used sumatriptan 2 days ago.  In transit, received ntiro x 3, aspirin and 8 mg IV morphine.  EKG revealed sinus rhythm with inferior depression, ST in II, avl V2, V3. Strips show runs of idioventricular rhythm. Repeat in ER demonstrates less STE in I and AVL and near resolution of inferior depressions. Labs are significant for normal renal fnx.   Taken urgently to the cath lab.  Preliminary cath report shows dissection in the LM and LAD. EF 45%, mid to distal anterior apical hypokinesis   Past Medical History  Diagnosis Date  . PMS (premenstrual syndrome)   . Acid reflux   . Menstrual migraine   . H/O varicella   . Monilial vaginitis 2005  . Leukorrhea 2006    Physiologic  . Weight gain 2010  . PMS (premenstrual syndrome) 2011  . Ovarian cyst, left 2012  . Herniated disc, cervical   . Neuromuscular disorder   . Anxiety      Surgical History:  Past Surgical History  Procedure Laterality Date  . Wisdom tooth extraction    . Laparoscopic tubal ligation  02/20/2011    Procedure: LAPAROSCOPIC TUBAL LIGATION;  Surgeon: Alwyn Pea, MD;  Location: [facility];  Service: Gynecology;   Laterality: Bilateral;  with Removal of Mirena and Robotic Assisted Bilateral Tubal Ligation  . Robotic assisted gynecologic procedure  02-2011    ovarian cystectomy  . Tubal ligation  02/20/2011     Home Meds: Prior to Admission medications   Medication Sig Start Date End Date Taking? Authorizing Provider  albuterol (PROVENTIL HFA;VENTOLIN HFA) 108 (90 BASE) MCG/ACT inhaler Inhale 2 puffs into the lungs every 6 (six) hours as needed for wheezing or shortness of breath.    Historical Provider, MD  eletriptan (RELPAX) 40 MG tablet One tablet by mouth at onset of headache. May repeat in 2 hours if headache persists or recurs. may repeat in 2 hours if necessary 07/09/12   Ellison Carwin, MD  ipratropium (ATROVENT HFA) 17 MCG/ACT inhaler Inhale 2 puffs into the lungs every 4 (four) hours as needed for wheezing. 03/31/13   Ellison Carwin, MD  meloxicam (MOBIC) 7.5 MG tablet TAKE 1 TABLET BY MOUTH DAILY    Mancel Bale, PA-C  mometasone-formoterol (DULERA) 100-5 MCG/ACT AERO Inhale 2 puffs into the lungs 2 (two) times daily. 03/31/13   Ellison Carwin, MD  Multiple Vitamin (MULTIVITAMIN) tablet Take 1 tablet by mouth daily.    Historical Provider, MD  sertraline (ZOLOFT) 50 MG tablet Take 1 tablet (50 mg total) by mouth daily. 07/09/12   Ellison Carwin, MD  SUMAtriptan (IMITREX) 100 MG tablet Take 100 mg by mouth as needed for migraine.    Historical Provider, MD  Allergies:  Allergies  Allergen Reactions  . Vicodin [Hydrocodone-Acetaminophen] Nausea And Vomiting    History   Social History  . Marital Status: Married    Spouse Name: Ovid Curd    Number of Children: 2  . Years of Education: 17+   Occupational History  . Clinical Social Work/Outpatient Therapist    Social History Main Topics  . Smoking status: Never Smoker   . Smokeless tobacco: Never Used  . Alcohol Use: No  . Drug Use: No  . Sexual Activity: Yes    Partners: Male    Birth Control/ Protection: Surgical   Other  Topics Concern  . Not on file   Social History Narrative   Lives with husband, almost 89 year old daughter and 16 year old son.     Family History  Problem Relation Age of Onset  . Uterine cancer Mother   . Cancer Mother   . Breast cancer Paternal Aunt 64  . Hypertension Father   . Diabetes Father   . Hypertension Brother   . Hypertension Brother     Review of Systems: General: negative for chills, fever, night sweats or weight changes.  Cardiovascular: see HPI. Dermatological: negative for rash Respiratory: negative for cough or wheezing Urologic: negative for hematuria Abdominal: negative for nausea, vomiting, diarrhea, bright red blood per rectum, melena, or hematemesis Neurologic: negative for visual changes, syncope, or dizziness All other systems reviewed and are otherwise negative except as noted above.  Labs:   Lab Results  Component Value Date   WBC 14.9* 08/17/2013   HGB 13.9 08/17/2013   HCT 41.0 08/17/2013   MCV 73.3* 08/17/2013   PLT 308 08/17/2013   No results found for this basename: NA, K, CL, CO2, BUN, CREATININE, CALCIUM, LABALBU, PROT, BILITOT, ALKPHOS, ALT, AST, GLUCOSE,  in the last 168 hours No results found for this basename: CKTOTAL, CKMB, TROPONINI,  in the last 72 hours Lab Results  Component Value Date   CHOL 205* 12/31/2011   HDL 61 12/31/2011   LDLCALC 127* 12/31/2011   TRIG 83 12/31/2011   No results found for this basename: DDIMER    Radiology/Studies:  No results found.   EKG: see HPI  Physical Exam: SpO2 99.00%.  120s/70s, p 80s General: Well developed, well nourished, in mild distress. Head: Normocephalic, atraumatic, sclera non-icteric, nares are without discharge Neck: Supple. Negative for carotid bruits. JVD not elevated. Lungs: Clear bilaterally to auscultation without wheezes, rales, or rhonchi. Breathing is unlabored. Heart: RRR with S1 S2. No murmurs, rubs, or gallops appreciated. Abdomen: Soft, non-tender, non-distended with  normoactive bowel sounds. No rebound/guarding. No obvious abdominal masses. Msk:  Strength and tone appear normal for age. Extremities: No edema. No clubbing or cyanosis. Distal pedal pulses are 2+ and equal bilaterally. Normal allens on R. Neuro: Alert and oriented X 3. Moves all extremities spontaneously. Psych:  Responds to questions appropriately. Drowsy.    ASSESSMENT AND PLAN:  Problem List 1. Spontaneous coronary dissection 2. Hypokalemia 3. Cervicalgia 4. Migraines, on triptan  43 y.o. 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  Management can be difficult. Encouragingly, she has TIMI 3 flow and is chest pain free at the moment. Conservative medical management recommended. Literature recommends aspirin, statin, beta blockers/CCBs. Avoid full heparinization due to theoretical risk of extending the false lumen. Plavix is plus/minus (benefit of  reducing false lumen but may be a barrier to go to urgent surgery if needed).  Also, will leave sheath in place with slow heparin flow to maintain patency due to the high risk of having to return to the cath lab if symptoms/EKG changes return.  Cause of dissection? She fits the stereotype of young female. Not on HRT or evidence of systemic autoimmune. No e/o FMD.  Though triptans are associated with coronary vasospasm, no reports of dissection. Denies tobacco or illicit drug use.  Check lipids and HgA1c to risk stratify for atherosclerotic risk factors.  Diet: liquids for now in case procedure needed again Prophylaxis: heparin subq. Full code.  Signed, Elias Else, Mila Homer MD 08/17/2013, 9:34 PM

## 2013-08-17 NOTE — ED Provider Notes (Signed)
CSN: [id number]     Arrival date & time 08/17/13  2114 History   First MD Initiated Contact with Patient 08/17/13 2129     Chief Complaint  Patient presents with  . Chest Pain     (Consider location/radiation/quality/duration/timing/severity/associated sxs/prior Treatment) The history is provided by the patient.   43 year old female had onset at about 8 PM of midsternal chest pain with radiation to the back. She describes a pressure feeling which is severe and she rated it a 9/10. There is associated dyspnea and diaphoresis but no nausea. This occurred while she was running up a flight of steps. She's never had pain like this before. EMS was called and have given her aspirin and nitroglycerin and morphine with partial relief of pain. Her only cardiac risk factor is her father died of an MI at age 52. She is a nonsmoker and denies illicit drug use. She denies history of hypertension, diabetes, hyperlipidemia.  Past Medical History  Diagnosis Date  . PMS (premenstrual syndrome)   . Acid reflux   . Menstrual migraine   . H/O varicella   . Monilial vaginitis 2005  . Leukorrhea 2006    Physiologic  . Weight gain 2010  . PMS (premenstrual syndrome) 2011  . Ovarian cyst, left 2012  . Herniated disc, cervical   . Neuromuscular disorder   . Anxiety    Past Surgical History  Procedure Laterality Date  . Wisdom tooth extraction    . Laparoscopic tubal ligation  02/20/2011    Procedure: LAPAROSCOPIC TUBAL LIGATION;  Surgeon: Alwyn Pea, MD;  Location: [facility];  Service: Gynecology;  Laterality: Bilateral;  with Removal of Mirena and Robotic Assisted Bilateral Tubal Ligation  . Robotic assisted gynecologic procedure  02-2011    ovarian cystectomy  . Tubal ligation  02/20/2011   Family History  Problem Relation Age of Onset  . Uterine cancer Mother   . Cancer Mother   . Breast cancer Paternal Aunt 76  . Hypertension Father   . Diabetes Father   . Hypertension Brother   . Hypertension  Brother    History  Substance Use Topics  . Smoking status: Never Smoker   . Smokeless tobacco: Never Used  . Alcohol Use: No   OB History   Grav Para Term Preterm Abortions TAB SAB Ect Mult Living   3 2             Review of Systems  All other systems reviewed and are negative.     Allergies  Vicodin  Home Medications   Prior to Admission medications   Medication Sig Start Date End Date Taking? Authorizing Provider  albuterol (PROVENTIL HFA;VENTOLIN HFA) 108 (90 BASE) MCG/ACT inhaler Inhale 2 puffs into the lungs every 6 (six) hours as needed for wheezing or shortness of breath.    Historical Provider, MD  eletriptan (RELPAX) 40 MG tablet One tablet by mouth at onset of headache. May repeat in 2 hours if headache persists or recurs. may repeat in 2 hours if necessary 07/09/12   Ellison Carwin, MD  ipratropium (ATROVENT HFA) 17 MCG/ACT inhaler Inhale 2 puffs into the lungs every 4 (four) hours as needed for wheezing. 03/31/13   Ellison Carwin, MD  meloxicam (MOBIC) 7.5 MG tablet TAKE 1 TABLET BY MOUTH DAILY    Mancel Bale, PA-C  mometasone-formoterol (DULERA) 100-5 MCG/ACT AERO Inhale 2 puffs into the lungs 2 (two) times daily. 03/31/13   Ellison Carwin, MD  Multiple Vitamin (MULTIVITAMIN) tablet Take 1  tablet by mouth daily.    Historical Provider, MD  sertraline (ZOLOFT) 50 MG tablet Take 1 tablet (50 mg total) by mouth daily. 07/09/12   Ellison Carwin, MD  SUMAtriptan (IMITREX) 100 MG tablet Take 100 mg by mouth as needed for migraine.    Historical Provider, MD   BP 126/68  Pulse 67  Resp 15  SpO2 99% Physical Exam  Nursing note and vitals reviewed.  43 year old female, resting comfortably and in no acute distress. Vital signs are normal. Oxygen saturation is 99%, which is normal. Head is normocephalic and atraumatic. PERRLA, EOMI. Oropharynx is clear. Neck is nontender and supple without adenopathy or JVD. Back is nontender and there is no CVA  tenderness. Lungs are clear without rales, wheezes, or rhonchi. Chest is nontender. Heart has regular rate and rhythm without murmur. Abdomen is soft, flat, nontender without masses or hepatosplenomegaly and peristalsis is normoactive. Extremities have no cyanosis or edema, full range of motion is present. Skin is warm and dry without rash. Neurologic: Mental status is normal, cranial nerves are intact, there are no motor or sensory deficits.  ED Course  Procedures (including critical care time) Labs Review Results for orders placed during the hospital encounter of 08/17/13  APTT      Result Value Ref Range   aPTT 28  24 - 37 seconds  CBC      Result Value Ref Range   WBC 14.9 (*) 4.0 - 10.5 K/uL   RBC 5.05  3.87 - 5.11 MIL/uL   Hemoglobin 12.9  12.0 - 15.0 g/dL   HCT 37.0  36.0 - 46.0 %   MCV 73.3 (*) 78.0 - 100.0 fL   MCH 25.5 (*) 26.0 - 34.0 pg   MCHC 34.9  30.0 - 36.0 g/dL   RDW 14.1  11.5 - 15.5 %   Platelets 308  150 - 400 K/uL  COMPREHENSIVE METABOLIC PANEL      Result Value Ref Range   Sodium 138  137 - 147 mEq/L   Potassium 3.4 (*) 3.7 - 5.3 mEq/L   Chloride 98  96 - 112 mEq/L   CO2 19  19 - 32 mEq/L   Glucose, Bld 179 (*) 70 - 99 mg/dL   BUN 10  6 - 23 mg/dL   Creatinine, Ser 0.78  0.50 - 1.10 mg/dL   Calcium 9.1  8.4 - 10.5 mg/dL   Total Protein 7.0  6.0 - 8.3 g/dL   Albumin 3.9  3.5 - 5.2 g/dL   AST 19  0 - 37 U/L   ALT 11  0 - 35 U/L   Alkaline Phosphatase 44  39 - 117 U/L   Total Bilirubin <0.2 (*) 0.3 - 1.2 mg/dL   GFR calc non Af Amer >90  >90 mL/min   GFR calc Af Amer >90  >90 mL/min   Anion gap 21 (*) 5 - 15  PROTIME-INR      Result Value Ref Range   Prothrombin Time 12.8  11.6 - 15.2 seconds   INR 0.96  0.00 - 1.49  CK TOTAL AND CKMB      Result Value Ref Range   Total CK 459 (*) 7 - 177 U/L   CK, MB 29.3 (*) 0.3 - 4.0 ng/mL   Relative Index 6.4 (*) 0.0 - 2.5  TROPONIN I      Result Value Ref Range   Troponin I 5.69 (*) <0.30 ng/mL  LIPID  PANEL  Result Value Ref Range   Cholesterol 184  0 - 200 mg/dL   Triglycerides 41  <150 mg/dL   HDL 64  >39 mg/dL   Total CHOL/HDL Ratio 2.9     VLDL 8  0 - 40 mg/dL   LDL Cholesterol 112 (*) 0 - 99 mg/dL  POCT I-STAT, CHEM 8      Result Value Ref Range   Sodium 140  137 - 147 mEq/L   Potassium 3.2 (*) 3.7 - 5.3 mEq/L   Chloride 102  96 - 112 mEq/L   BUN 9  6 - 23 mg/dL   Creatinine, Ser 0.80  0.50 - 1.10 mg/dL   Glucose, Bld 177 (*) 70 - 99 mg/dL   Calcium, Ion 1.17  1.12 - 1.23 mmol/L   TCO2 20  0 - 100 mmol/L   Hemoglobin 13.9  12.0 - 15.0 g/dL   HCT 41.0  36.0 - 46.0 %  POCT I-STAT TROPONIN I      Result Value Ref Range   Troponin i, poc 0.14 (*) 0.00 - 0.08 ng/mL   Comment NOTIFIED PHYSICIAN     Comment 3             EKG Interpretation   Date/Time:  Thursday August 17 2013 21:16:51 EDT Ventricular Rate:  75 PR Interval:  143 QRS Duration: 80 QT Interval:  397 QTC Calculation: 443 R Axis:   63 Text Interpretation:  Sinus rhythm Anterolateral infarct, acute (LAD)  Anterior infarct No old tracing to compare Confirmed by [facility]  MD, Blessing Zaucha  ([id number]) on 08/17/2013 9:36:09 PM     CRITICAL CARE Performed by: FKCLE,XNTZG Total critical care time: 35 minutes Critical care time was exclusive of separately billable procedures and treating other patients. Critical care was necessary to treat or prevent imminent or life-threatening deterioration. Critical care was time spent personally by me on the following activities: development of treatment plan with patient and/or surrogate as well as nursing, discussions with consultants, evaluation of patient's response to treatment, examination of patient, obtaining history from patient or surrogate, ordering and performing treatments and interventions, ordering and review of laboratory studies, ordering and review of radiographic studies, pulse oximetry and re-evaluation of patient's condition.  MDM   Final diagnoses:  ST elevation  myocardial infarction (STEMI), unspecified artery    43 year old female with chest pain and ECG showing STEMI. Review of the ECGs from EMS to considerable variability of ST segments and I wonder if this may be variant angina. However, given her presentation an ECG, she will need to be taken to cath lab. Dr. Elias Else is here from cardiology and is taking over her care.      Delora Fuel, MD 01/74/94 [id number]

## 2013-08-17 NOTE — CV Procedure (Addendum)
PROCEDURE:  Left heart catheterization with selective coronary angiography, left ventriculogram.  INDICATIONS:  Anterior STEMI  The risks, benefits, and details of the procedure were explained to the patient.  The patient verbalized understanding and wanted to proceed.  Informed written consent was obtained.  PROCEDURE TECHNIQUE:  After Xylocaine anesthesia a 55F sheath was placed in the right femoral artery with a single anterior needle wall stick.   Left coronary angiography was done using a Judkins L 3 guide catheter.  Right coronary angiography was done using a Judkins R4 guide catheter.  Left ventriculography was done using a pigtail catheter. The sheath was left in to a pressure bag with Heparin.   CONTRAST:  Total of 45 cc.  COMPLICATIONS:  None.    HEMODYNAMICS:  Aortic pressure was 124/82; LV pressure was 120/16; LVEDP 22.  There was no gradient between the left ventricle and aorta.    ANGIOGRAPHIC DATA:   The left main coronary artery is diffusely narrowed. The narrowing is worse at the ostium. The left main has the appearance of a spontaneous dissection.  The left anterior descending artery is a large vessel which wraps around the apex. The dissection which is in the left main is also present in the LAD. There is a large first diagonal which appears to have an irregularity at the very proximal portion. The mid LAD narrows very significantly. The distal LAD appears angiographically normal and widely patent. I suspect the dissection originated in the mid LAD and propagated retrograde back to the left main. There is a second diagonal which is also diffusely narrowed but patent and with TIMI-3 flow.  The left circumflex artery is a large vessel. There 2 large obtuse marginal vessels which are widely patent. The circumflex vessel appears angiographically normal. There is no evidence of dissection in the circumflex.  The right coronary artery is a large dominant vessel. The posterior  lateral artery is medium size and widely patent the posterior descending artery is large and patent. The vessel is angiographically normal.  LEFT VENTRICULOGRAM:  Left ventricular angiogram was done in the 30 RAO projection and revealed a mid to distal anterior and apical wall motion abnormality.  Moderately decreased systolic function with an estimated ejection fraction of 35 %.  LVEDP was 22 mmHg.  IMPRESSIONS:  1. 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   TIMI-3 flow in the LAD and all its branches. 2. Normal left circumflex artery and its branches. 3. Normal right coronary artery. 4. Moderately decreased left ventricular systolic function.  LVEDP 22 mmHg.  Ejection fraction 35 %.  RECOMMENDATION:  She'll be watched in the ICU. Will avoid IV heparin and IV to be IIIa inhibitor to prevent worsening of the intramural hematoma. Will give full dose aspirin. I discussed the case with Dr. Prescott Gum.  Will hold off on Plavix at this time. If the patient decompensates later, we'll plan repeat angiogram and potential emergent bypass surgery.  Beta blocker to keep heart rate and blood pressure under control. We'll also give anxiolytic medicine as necessary.  If she remains stable overnight, plan to pull the sheath tomorrow morning.  I spoke to the family at length.  We spoke about the variable clinical courses that can happen with SCAD.  Hopefully, she will be stable through the next 48 hours.  If her pain comes back, we may have to repeat angiogram and potentially consider  CABG emergently.  A percutaneous approach may entail a very long area of stent, and would jeopardize the large circumflex.  THe husband and mother-in-law are aware.  Will try to keep BP and HR controlled.  Use Xanax for anxiety as well.

## 2013-08-17 NOTE — ED Notes (Signed)
Patient arrived via [facility] from home with active mid sternal chest pain described as pressure with diaphoresis. Patient has been having neck pain x 4 days, mirgraine x 3 days. She has recently been started to Mobic. Husband administered asprin 324mg  po prior to EMS. EMS administered NTG x4, Morphine 8mg . VSS, A/0.

## 2013-08-17 NOTE — Progress Notes (Signed)
Day of Surgery Procedure(s) (LRB): LEFT HEART CATHETERIZATION WITH CORONARY ANGIOGRAM (Bilateral) Subjective: Patient examined, coronary angiogram was reviewed with Dr. Irish Lack 43 year old healthy female with a few days of increasing chest pain presents with EKG changes and positive cardiac enzymes Cardiac catheterization demonstrate spontaneous dissection of the LAD possibly starting in the left main LV apical hypokinesia The patient's chest pain resolved and she is currently comfortable on IV nitroglycerin aspirin and beta blocker The plan is for medical therapy then re look cath in 48 hours Objective: Vital signs in last 24 hours: Pulse Rate:  [67-88] 67 (07/02 2330) Cardiac Rhythm:  [-] Normal sinus rhythm (07/02 2300) Resp:  [13-15] 15 (07/02 2330) BP: (109-126)/(68-78) 126/68 mmHg (07/02 2330) SpO2:  [99 %] 99 % (07/02 2330)  Hemodynamic parameters for last 24 hours:   normal sinus rhythm  Intake/Output from previous day:   Intake/Output this shift: Total I/O In: 100 [P.O.:100] Out: -   Young female resting comfortably somewhat anxious HEENT normocephalic pupils equal Neck without JVD mass or bruit Cardiac rhythm regular murmur or rub Abdomen soft nontender Extremities warm without edema or tenderness, cath sheath in groin Vascular pulses present no venous insufficiency Neurologic no focal motor deficit  Lab Results:  Recent Labs  08/17/13 2123 08/17/13 2135  WBC 14.9*  --   HGB 12.9 13.9  HCT 37.0 41.0  PLT 308  --    BMET:  Recent Labs  08/17/13 2123 08/17/13 2135  NA 138 140  K 3.4* 3.2*  CL 98 102  CO2 19  --   GLUCOSE 179* 177*  BUN 10 9  CREATININE 0.78 0.80  CALCIUM 9.1  --     PT/INR:  Recent Labs  08/17/13 2123  LABPROT 12.8  INR 0.96   ABG    Component Value Date/Time   TCO2 20 08/17/2013 2135   CBG (last 3)  No results found for this basename: GLUCAP,  in the last 72 hours  Assessment/Plan: S/P Procedure(s) (LRB): LEFT  HEART CATHETERIZATION WITH CORONARY ANGIOGRAM (Bilateral) Agree with conservative management but would recommend low threshold for returning to cath lab for recurrent symptoms and probable surgical revascularization if no improvement in LAD morphology. Will follow   LOS: 0 days    VAN TRIGT III,Anyelina Claycomb 08/17/2013

## 2013-08-18 ENCOUNTER — Encounter (HOSPITAL_COMMUNITY): Payer: Self-pay | Admitting: *Deleted

## 2013-08-18 LAB — CREATININE, SERUM: Creatinine, Ser: 0.64 mg/dL (ref 0.50–1.10)

## 2013-08-18 LAB — LIPID PANEL
Cholesterol: 173 mg/dL (ref 0–200)
HDL: 65 mg/dL (ref >39)
LDL CALC: 90 mg/dL (ref 0–99)
Total CHOL/HDL Ratio: 2.7 RATIO
Triglycerides: 90 mg/dL (ref <150)
VLDL: 18 mg/dL (ref 0–40)

## 2013-08-18 LAB — BASIC METABOLIC PANEL
ANION GAP: 16 — AB (ref 5–15)
BUN: 8 mg/dL (ref 6–23)
CALCIUM: 8.4 mg/dL (ref 8.4–10.5)
CHLORIDE: 100 meq/L (ref 96–112)
CO2: 21 meq/L (ref 19–32)
Creatinine, Ser: 0.63 mg/dL (ref 0.50–1.10)
GFR calc Af Amer: >90 mL/min (ref >90)
GFR calc non Af Amer: >90 mL/min (ref >90)
Glucose, Bld: 145 mg/dL — ABNORMAL HIGH (ref 70–99)
POTASSIUM: 4.2 meq/L (ref 3.7–5.3)
SODIUM: 137 meq/L (ref 137–147)

## 2013-08-18 LAB — TROPONIN I
TROPONIN I: 16.51 ng/mL — AB (ref <0.30)
Troponin I: >20 ng/mL (ref <0.30)

## 2013-08-18 LAB — HEMOGLOBIN A1C
Hgb A1c MFr Bld: 5.6 % (ref <5.7)
Hgb A1c MFr Bld: 5.6 % (ref <5.7)
Mean Plasma Glucose: 114 mg/dL (ref <117)
Mean Plasma Glucose: 114 mg/dL (ref <117)

## 2013-08-18 LAB — CBC
HEMATOCRIT: 35.4 % — AB (ref 36.0–46.0)
HEMOGLOBIN: 12.7 g/dL (ref 12.0–15.0)
MCH: 26.5 pg (ref 26.0–34.0)
MCHC: 35.9 g/dL (ref 30.0–36.0)
MCV: 73.9 fL — ABNORMAL LOW (ref 78.0–100.0)
Platelets: 259 10*3/uL (ref 150–400)
RBC: 4.79 MIL/uL (ref 3.87–5.11)
RDW: 14 % (ref 11.5–15.5)
WBC: 11.8 10*3/uL — AB (ref 4.0–10.5)

## 2013-08-18 LAB — MRSA PCR SCREENING: MRSA by PCR: NEGATIVE

## 2013-08-18 LAB — POCT ACTIVATED CLOTTING TIME: ACTIVATED CLOTTING TIME: 90 s

## 2013-08-18 MED ORDER — MORPHINE SULFATE 2 MG/ML IJ SOLN
INTRAMUSCULAR | Status: AC
  Filled 2013-08-18: qty 1

## 2013-08-18 MED ORDER — MORPHINE SULFATE 2 MG/ML IJ SOLN
2.0000 mg | INTRAMUSCULAR | Status: DC | PRN
  Administered 2013-08-18 (×3): 2 mg via INTRAVENOUS
  Filled 2013-08-18 (×2): qty 1

## 2013-08-18 MED ORDER — ATROPINE SULFATE 0.1 MG/ML IJ SOLN
INTRAMUSCULAR | Status: AC
  Filled 2013-08-18: qty 10

## 2013-08-18 MED ORDER — SODIUM CHLORIDE 0.9 % IV SOLN
INTRAVENOUS | Status: DC
  Administered 2013-08-18: 14:00:00 via INTRAVENOUS

## 2013-08-18 MED FILL — Sodium Chloride IV Soln 0.9%: INTRAVENOUS | Qty: 50 | Status: AC

## 2013-08-18 NOTE — Progress Notes (Signed)
Dr Irish Lack aware of pt pos troponin.

## 2013-08-18 NOTE — Progress Notes (Signed)
Sheath pulled from R femoral site per MD order; pressure held for 20 minutes. Pulses checked throughout and were palpable during entire procedure. Site is a level 0. Vital signs stable. Post instructions given. Pt stated understanding.

## 2013-08-18 NOTE — Progress Notes (Signed)
Utilization Review Completed.Donne Anon T7/04/2013

## 2013-08-18 NOTE — Progress Notes (Addendum)
SUBJECTIVE:  Intermittent mild chest pain last night.  No pain like the very severe pain ("20 out of 10") that brought her to the hospital.  Had difficulty sleeping.  One episode of 4/10 CP at 4 AM that resolved with morphine and increased IV NTG.  She did have some nausea during the night.  OBJECTIVE:   Vitals:   Filed Vitals:   08/18/13 0630 08/18/13 0700 08/18/13 0800 08/18/13 0909  BP:  100/49 99/53 97/53   Pulse: 65 67 70 64  Temp:      TempSrc:      Resp: 17 15 14    Height:      Weight:      SpO2: 98% 98% 98%    I&O's:   Intake/Output Summary (Last 24 hours) at 08/18/13 0917 Last data filed at 08/18/13 0800  Gross per 24 hour  Intake  894.5 ml  Output      0 ml  Net  894.5 ml   TELEMETRY: Reviewed telemetry pt in NSR:     PHYSICAL EXAM General: Well developed, well nourished, in no acute distress Head:   Normal cephalic and atramatic  Lungs:   Clear bilaterally to auscultation. Heart:   HRRR S1 S2  No JVD.   Abdomen: abdomen soft and non-tender Msk:  Back normal,  Normal strength and tone for age. Extremities:   No edema.  No right groin hematoma; 2+ right PT pulse Neuro: Alert and oriented. Psych:  Normal affect, responds appropriately   LABS: Basic Metabolic Panel:  Recent Labs  08/17/13 2123 08/17/13 2135 08/18/13 0009 08/18/13 0315  NA 138 140  --  137  K 3.4* 3.2*  --  4.2  CL 98 102  --  100  CO2 19  --   --  21  GLUCOSE 179* 177*  --  145*  BUN 10 9  --  8  CREATININE 0.78 0.80 0.64 0.63  CALCIUM 9.1  --   --  8.4   Liver Function Tests:  Recent Labs  08/17/13 2123  AST 19  ALT 11  ALKPHOS 44  BILITOT <0.2*  PROT 7.0  ALBUMIN 3.9   No results found for this basename: LIPASE, AMYLASE,  in the last 72 hours CBC:  Recent Labs  08/17/13 2123 08/17/13 2135 08/18/13 0009  WBC 14.9*  --  11.8*  HGB 12.9 13.9 12.7  HCT 37.0 41.0 35.4*  MCV 73.3*  --  73.9*  PLT 308  --  259   Cardiac Enzymes:  Recent Labs  08/17/13 2212  08/18/13 0009 08/18/13 0330  CKTOTAL 459*  --   --   CKMB 29.3*  --   --   TROPONINI 5.69* 16.51* >20.00*   BNP: No components found with this basename: POCBNP,  D-Dimer: No results found for this basename: DDIMER,  in the last 72 hours Hemoglobin A1C: No results found for this basename: HGBA1C,  in the last 72 hours Fasting Lipid Panel:  Recent Labs  08/18/13 0330  CHOL 173  HDL 65  LDLCALC 90  TRIG 90  CHOLHDL 2.7   Thyroid Function Tests: No results found for this basename: TSH, T4TOTAL, FREET3, T3FREE, THYROIDAB,  in the last 72 hours Anemia Panel: No results found for this basename: VITAMINB12, FOLATE, FERRITIN, TIBC, IRON, RETICCTPCT,  in the last 72 hours Coag Panel:   Lab Results  Component Value Date   INR 0.96 08/17/2013    RADIOLOGY: No results found.    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  PLAN:  ECG this AM is much improved from initial ECG.  She has only had low level pain which is nothing like what brought her in to the hospital.  Continue Aspirin.  IV NTG for HTN.  No heparin to avoid worsening of hematoma.  Advance diet to full liquids.    D/c right femoral sheath.    Discussed case with Dr. Angelena Form who is STEMI MD this weekend.  Dr. Prescott Gum aware of her sx last night.  I think repeat cath should only be done if she has significant ST elevation anteriorly as she did on 7/2 at 21:16; or if she has severe pain or hemodynamic instability.    Not surprised by elevated troponin given the wall motion abnormality that she has by v-gram.  I think she will be here over the weekend-likely in the ICU/stepdown area.  Consider coronary CTA on Monday vs repeat angio to see whether her dissection has improved.  Continue aggressive BP control, beta blockade and anxiolytics.  Jettie Booze., MD  08/18/2013  9:17 AM

## 2013-08-19 DIAGNOSIS — I219 Acute myocardial infarction, unspecified: Secondary | ICD-10-CM

## 2013-08-19 MED ORDER — ASPIRIN 325 MG PO TABS
ORAL_TABLET | ORAL | Status: AC
  Administered 2013-08-19: 325 mg
  Filled 2013-08-19: qty 1

## 2013-08-19 NOTE — Progress Notes (Signed)
   SUBJECTIVE:  Denies CP or dyspnea this AM  OBJECTIVE:   Vitals:   Filed Vitals:   08/19/13 0400 08/19/13 0500 08/19/13 0528 08/19/13 0600  BP: 102/64 118/74 99/61 83/48   Pulse: 67 78 68 67  Temp:      TempSrc:      Resp: 13 18 13 14   Height:      Weight:      SpO2: 99% 98% 100% 98%   I&O's:    Intake/Output Summary (Last 24 hours) at 08/19/13 0719 Last data filed at 08/19/13 0600  Gross per 24 hour  Intake 1542.4 ml  Output   1450 ml  Net   92.4 ml   TELEMETRY: Reviewed telemetry pt in NSR:     PHYSICAL EXAM General: Well developed, well nourished, in no acute distress Head:   Normal   Lungs:   CTA Heart:   RRR Abdomen: abdomen soft and non-tender Extremities:   No edema.   Neuro: Alert and oriented. Psych:  Normal affect, responds appropriately   LABS: Basic Metabolic Panel:  Recent Labs  08/17/13 2123 08/17/13 2135 08/18/13 0009 08/18/13 0315  NA 138 140  --  137  K 3.4* 3.2*  --  4.2  CL 98 102  --  100  CO2 19  --   --  21  GLUCOSE 179* 177*  --  145*  BUN 10 9  --  8  CREATININE 0.78 0.80 0.64 0.63  CALCIUM 9.1  --   --  8.4   Liver Function Tests:  Recent Labs  08/17/13 2123  AST 19  ALT 11  ALKPHOS 44  BILITOT <0.2*  PROT 7.0  ALBUMIN 3.9   CBC:  Recent Labs  08/17/13 2123 08/17/13 2135 08/18/13 0009  WBC 14.9*  --  11.8*  HGB 12.9 13.9 12.7  HCT 37.0 41.0 35.4*  MCV 73.3*  --  73.9*  PLT 308  --  259   Cardiac Enzymes:  Recent Labs  08/17/13 2212 08/18/13 0009 08/18/13 0330 08/18/13 1040  CKTOTAL 459*  --   --   --   CKMB 29.3*  --   --   --   TROPONINI 5.69* 16.51* >20.00* >20.00*   Hemoglobin A1C:  Recent Labs  08/18/13 0009  HGBA1C 5.6   Fasting Lipid Panel:  Recent Labs  08/18/13 0330  CHOL 173  HDL 65  LDLCALC 90  TRIG 90  CHOLHDL 2.7   Coag Panel:   Lab Results  Component Value Date   INR 0.96 08/17/2013        ASSESSMENT:  1 Spontaneous coronary artery dissection affecting the  LAD system and left main. 2 STEMI 3 ICM  PLAN:  No further CP. Continue Aspirin. BP low intermittently but improved this AM; on low dose NTG and metoprolol; will reduce dose if BP decreases.  No heparin to avoid worsening of hematoma. Cannot add ACEI at this point due to hypotension.  Dr Angelena Form and Dr Prescott Gum aware of patient if she deteriorates.   Will keep in CCU over weekend. Discuss coronary CTA on Monday vs repeat angio to see whether her dissection has improved.    Kirk Ruths, MD  08/19/2013  7:19 AM

## 2013-08-19 NOTE — Progress Notes (Signed)
      [address]       [place] [id number]             [phone]     CARDIOTHORACIC SURGERY PROGRESS NOTE  2 Days Post-Op  S/P Procedure(s) (LRB): LEFT HEART CATHETERIZATION WITH CORONARY ANGIOGRAM (Bilateral)  Subjective: No chest pain  Objective: Vital signs in last 24 hours: Temp:  [97.5 F (36.4 C)-98.7 F (37.1 C)] 98.5 F (36.9 C) (07/04 0751) Pulse Rate:  [58-81] 71 (07/04 0800) Cardiac Rhythm:  [-] Normal sinus rhythm (07/04 0700) Resp:  [12-23] 12 (07/04 0800) BP: (77-118)/(30-74) 101/63 mmHg (07/04 0800) SpO2:  [97 %-100 %] 100 % (07/04 0800)  Physical Exam:  Rhythm:   sinus  Breath sounds: clear  Heart sounds:  RRR  Incisions:  n/a  Abdomen:  soft  Extremities:  warm   Intake/Output from previous day: 07/03 0701 - 07/04 0700 In: 1542.4 [P.O.:420; I.V.:1122.4] Out: 1450 [Urine:1450] Intake/Output this shift: Total I/O In: 386 [P.O.:360; I.V.:26] Out: -   Lab Results:  Recent Labs  08/17/13 2123 08/17/13 2135 08/18/13 0009  WBC 14.9*  --  11.8*  HGB 12.9 13.9 12.7  HCT 37.0 41.0 35.4*  PLT 308  --  259   BMET:  Recent Labs  08/17/13 2123 08/17/13 2135 08/18/13 0009 08/18/13 0315  NA 138 140  --  137  K 3.4* 3.2*  --  4.2  CL 98 102  --  100  CO2 19  --   --  21  GLUCOSE 179* 177*  --  145*  BUN 10 9  --  8  CREATININE 0.78 0.80 0.64 0.63  CALCIUM 9.1  --   --  8.4    CBG (last 3)  No results found for this basename: GLUCAP,  in the last 72 hours PT/INR:   Recent Labs  08/17/13 2123  LABPROT 12.8  INR 0.96    EKG: NSR w/out acute ischemic changes     Assessment/Plan: S/P Procedure(s) (LRB): LEFT HEART CATHETERIZATION WITH CORONARY ANGIOGRAM (Bilateral)  No signs of recurrent ischemia Plan per cardiology team  OWEN,CLARENCE H 08/19/2013 11:09 AM

## 2013-08-20 LAB — CBC
HEMATOCRIT: 35.5 % — AB (ref 36.0–46.0)
Hemoglobin: 12.5 g/dL (ref 12.0–15.0)
MCH: 26.2 pg (ref 26.0–34.0)
MCHC: 35.2 g/dL (ref 30.0–36.0)
MCV: 74.3 fL — AB (ref 78.0–100.0)
Platelets: 243 10*3/uL (ref 150–400)
RBC: 4.78 MIL/uL (ref 3.87–5.11)
RDW: 14.1 % (ref 11.5–15.5)
WBC: 8.2 10*3/uL (ref 4.0–10.5)

## 2013-08-20 LAB — BASIC METABOLIC PANEL
Anion gap: 14 (ref 5–15)
BUN: 10 mg/dL (ref 6–23)
CALCIUM: 9.6 mg/dL (ref 8.4–10.5)
CO2: 24 meq/L (ref 19–32)
Chloride: 101 mEq/L (ref 96–112)
Creatinine, Ser: 0.77 mg/dL (ref 0.50–1.10)
GFR calc Af Amer: >90 mL/min (ref >90)
GFR calc non Af Amer: >90 mL/min (ref >90)
GLUCOSE: 95 mg/dL (ref 70–99)
Potassium: 3.9 mEq/L (ref 3.7–5.3)
SODIUM: 139 meq/L (ref 137–147)

## 2013-08-20 NOTE — Progress Notes (Signed)
   SUBJECTIVE:  Denies CP or dyspnea this AM  OBJECTIVE:   Vitals:   Filed Vitals:   08/20/13 0340 08/20/13 0400 08/20/13 0500 08/20/13 0600  BP:  91/51 84/38 89/49   Pulse:  65 66 82  Temp: 99.5 F (37.5 C)     TempSrc: Oral     Resp:  14 17 20   Height:      Weight:      SpO2:  97% 97% 97%   I&O's:    Intake/Output Summary (Last 24 hours) at 08/20/13 8828 Last data filed at 08/20/13 0300  Gross per 24 hour  Intake    704 ml  Output    800 ml  Net    -96 ml   TELEMETRY: Reviewed telemetry pt in NSR:     PHYSICAL EXAM General: Well developed, well nourished, in no acute distress Head:   Normal   Lungs:   CTA Heart:   RRR Abdomen: abdomen soft and non-tender Extremities:   No edema.   Neuro: Alert and oriented. Psych:  Normal affect, responds appropriately   LABS: Basic Metabolic Panel:  Recent Labs  08/18/13 0315 08/20/13 0230  NA 137 139  K 4.2 3.9  CL 100 101  CO2 21 24  GLUCOSE 145* 95  BUN 8 10  CREATININE 0.63 0.77  CALCIUM 8.4 9.6   Liver Function Tests:  Recent Labs  08/17/13 2123  AST 19  ALT 11  ALKPHOS 44  BILITOT <0.2*  PROT 7.0  ALBUMIN 3.9   CBC:  Recent Labs  08/18/13 0009 08/20/13 0230  WBC 11.8* 8.2  HGB 12.7 12.5  HCT 35.4* 35.5*  MCV 73.9* 74.3*  PLT 259 243   Cardiac Enzymes:  Recent Labs  08/17/13 2212 08/18/13 0009 08/18/13 0330 08/18/13 1040  CKTOTAL 459*  --   --   --   CKMB 29.3*  --   --   --   TROPONINI 5.69* 16.51* >20.00* >20.00*   Hemoglobin A1C:  Recent Labs  08/18/13 0009  HGBA1C 5.6   Fasting Lipid Panel:  Recent Labs  08/18/13 0330  CHOL 173  HDL 65  LDLCALC 90  TRIG 90  CHOLHDL 2.7   Coag Panel:   Lab Results  Component Value Date   INR 0.96 08/17/2013        ASSESSMENT:  1 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 2 STEMI 3 ICM  PLAN:  No further CP. Continue Aspirin. BP remains low but mainly when asleep; she states low BP  at home; DC metoprolol and follow.  No heparin to avoid worsening of hematoma. Cannot add ACEI at this point due to hypotension.  Dr Angelena Form and [facility] aware of patient if she deteriorates.   Will keep in CCU today. Discuss coronary CTA vs repeat angio tomorrow to see whether her dissection has improved.    Kirk Ruths, MD  08/20/2013  7:21 AM

## 2013-08-20 NOTE — Progress Notes (Signed)
T[facility] BRIEF PROGRESS NOTE   No chest pain Await f/u imaging  [name] H 08/20/2013 11:57 AM

## 2013-08-21 ENCOUNTER — Inpatient Hospital Stay (HOSPITAL_COMMUNITY): Payer: Managed Care, Other (non HMO)

## 2013-08-21 DIAGNOSIS — I2542 Coronary artery dissection: Secondary | ICD-10-CM

## 2013-08-21 LAB — POCT ACTIVATED CLOTTING TIME: Activated Clotting Time: 163 seconds

## 2013-08-21 MED ORDER — METOPROLOL TARTRATE 12.5 MG HALF TABLET
12.5000 mg | ORAL_TABLET | Freq: Two times a day (BID) | ORAL | Status: DC
  Administered 2013-08-21 – 2013-08-22 (×3): 12.5 mg via ORAL
  Filled 2013-08-21 (×4): qty 1

## 2013-08-21 MED ORDER — METOPROLOL TARTRATE 1 MG/ML IV SOLN
INTRAVENOUS | Status: AC
  Administered 2013-08-21: 5 mg
  Filled 2013-08-21: qty 5

## 2013-08-21 MED ORDER — IOHEXOL 350 MG/ML SOLN
80.0000 mL | Freq: Once | INTRAVENOUS | Status: AC | PRN
  Administered 2013-08-21: 80 mL via INTRAVENOUS

## 2013-08-21 MED ORDER — NITROGLYCERIN 0.4 MG SL SUBL
SUBLINGUAL_TABLET | SUBLINGUAL | Status: AC
  Administered 2013-08-21: 0.4 mg
  Filled 2013-08-21: qty 1

## 2013-08-21 NOTE — Progress Notes (Addendum)
Reviewed cardiac CTA with Dr Johnsie Cancel and Dr Meda Coffee; there appears to be no compromise of LM or LAD; dissection is not apparent; plan transfer to telemetry today and ambulate; DC in AM if stable. Echo 1 week to reassess LV function and wall motion (previous Vgram showed apical WMA; we have not anticoagulated to decrease risk of apical thrombus because of concern of worsening intramural hematoma from coronary dissection). FU Dr Irish Lack. Kirk Ruths

## 2013-08-21 NOTE — Progress Notes (Signed)
   SUBJECTIVE:  Denies CP or dyspnea this AM  OBJECTIVE:   Vitals:   Filed Vitals:   08/21/13 0400 08/21/13 0500 08/21/13 0600 08/21/13 0700  BP: 92/56 97/35 91/53  95/55  Pulse: 73 73 63 73  Temp: 98.4 F (36.9 C)     TempSrc: Oral     Resp: 17 11 19 22   Height:      Weight:      SpO2: 96% 97% 96% 95%   I&O's:    Intake/Output Summary (Last 24 hours) at 08/21/13 0735 Last data filed at 08/21/13 0000  Gross per 24 hour  Intake    870 ml  Output   1200 ml  Net   -330 ml   TELEMETRY: Reviewed telemetry pt in NSR:     PHYSICAL EXAM General: Well developed, well nourished, in no acute distress Head:   Normal   Lungs:   CTA Heart:   RRR Abdomen: abdomen soft and non-tender Extremities:   No edema.   Neuro: Alert and oriented. 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 2 STEMI 3 ICM  PLAN:  No further CP. Continue Aspirin. BP improved but remains in the 90s; will try coreg 3.125 mg po BID and follow.  No heparin to avoid worsening of hematoma. Cannot add ACEI at this point due to hypotension. Plan coronary CTA to see whether her dissection has improved.   Kirk Ruths, MD  08/21/2013  7:35 AM

## 2013-08-22 ENCOUNTER — Encounter (HOSPITAL_COMMUNITY): Payer: Self-pay | Admitting: Physician Assistant

## 2013-08-22 ENCOUNTER — Other Ambulatory Visit: Payer: Self-pay | Admitting: Physician Assistant

## 2013-08-22 DIAGNOSIS — R911 Solitary pulmonary nodule: Secondary | ICD-10-CM | POA: Diagnosis present

## 2013-08-22 DIAGNOSIS — G43009 Migraine without aura, not intractable, without status migrainosus: Secondary | ICD-10-CM

## 2013-08-22 DIAGNOSIS — I2542 Coronary artery dissection: Principal | ICD-10-CM

## 2013-08-22 DIAGNOSIS — I519 Heart disease, unspecified: Secondary | ICD-10-CM

## 2013-08-22 MED ORDER — NITROGLYCERIN 0.4 MG SL SUBL: 0.4000 mg | SUBLINGUAL_TABLET | SUBLINGUAL | Status: DC | PRN

## 2013-08-22 MED ORDER — ACETAMINOPHEN 325 MG PO TABS: 650.0000 mg | ORAL_TABLET | ORAL | Status: DC | PRN

## 2013-08-22 MED ORDER — SIMVASTATIN 20 MG PO TABS: 20.0000 mg | ORAL_TABLET | Freq: Every day | ORAL | Status: DC

## 2013-08-22 MED ORDER — CARVEDILOL 3.125 MG PO TABS: 3.1250 mg | ORAL_TABLET | Freq: Two times a day (BID) | ORAL | Status: DC

## 2013-08-22 MED ORDER — ASPIRIN 325 MG PO TBEC: 325.0000 mg | DELAYED_RELEASE_TABLET | Freq: Every day | ORAL | Status: DC

## 2013-08-22 MED ORDER — METOPROLOL TARTRATE 12.5 MG HALF TABLET: 12.5000 mg | ORAL_TABLET | Freq: Two times a day (BID) | ORAL | Status: DC

## 2013-08-22 MED ORDER — ALPRAZOLAM 0.25 MG PO TABS: 0.2500 mg | ORAL_TABLET | Freq: Three times a day (TID) | ORAL | Status: DC | PRN

## 2013-08-22 NOTE — Discharge Summary (Signed)
Discharge Summary   Patient ID: [name] MRN: [id number], DOB/AGE: October 07, 1970 43 y.o. Admit date: 08/17/2013 D/C date:     08/22/2013  Primary Cardiologist: New (Dr. 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  HPI: [name] is a 43 y.o. female with a history of cervical disc disease, migraines and no past cardiac history who presented to [facility] on 08/17/13 with complaints of substernal chest pain and was found to have anterolateral ST elevation and positive troponin ( peak >20).  A code STEMI was activated by EMS and she was taken for emergent cardiac cath.   She reported sudden onset of chest pain at 8 pm while running up the stairs on the day of admission. She described it as constant central chest pressure that radiated to her back and was associated with shortness of breath and mild diaphoresis. She also complained of bilateral arm numbness but this has been ongoing due to her cervicalgia. She reported that she also had neck pain for several days due to cervical disk disease for which she has been taking mobic. Reports last used sumatriptan 2 days ago for a HA.    Hospital Course: She was taken for emergent cardiac cath by Dr. Irish Lack which revealed MPRESSIONS:  1. 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 TIMI-3 flow in the LAD and all its branches. 2. Normal left circumflex artery and its branches. 3. Normal right coronary artery. 4. Moderately decreased left ventricular systolic function. LVEDP 22 mmHg. Ejection fraction 35 %. LV apical hypokinesia  IV heparin  and IV IIbIIIa inhibitor were avoided to prevent worsening of the intramural hematoma. Dr. Irish Lack spoke with [place] who also advised against Plavix in the case that she need urgent surgical treatment. She was placed on full dose ASA as well as a beta blocker to keep heart rate and blood pressure under control. She was also given anxiolytic medicine as necessary. It was planned to repeat angiogram and potential emergent bypass surgery if the patient deteriorated later as a percutaneous approach may entail a very long area of stent, and would jeopardize the large circumflex.  She was transferred to CCU for BP management and close observation. Her BP with managed with NTG and BB. An ACEi could not be added due to hypotension.  She has some mild chest pain the day after presentation but none after that period. It was elected to proceed with coronary CT vs repeat angiography to see whether her dissection had improved. This revealed:   1)  LM and proximal LAD to be widely patent. Some tapering of the mid LAD with no intramyocardial course and good antegrade flow to distal vessel    2) Calcium score 0    3) Normal RCA/Circumflex    4) No mural apical thrombus    5) Normal ascending aorta 2.5 cm    6) See note radiology regarding subpleural nodule, benign liver cyst and chololithiasis  ** Coronary CT revealed a small subpleural lung nodule that will require repeat CT in 12 months**  The cardiac CTA was reviewed with Dr. Stanford Breed, Dr  Nishan and Dr Meda Coffee and there appears to be no compromise of LM or LAD; dissection was not apparent so she was transferred to telemetry with plans to discharge the next morning. Plans were made for an ECHO in 1 week to reassess LV function and wall motion (previous Vgram showed apical WMA). She remained off anticoagulation to decrease risk of apical thrombus because of concern of worsening intramural hematoma from coronary dissection.   The patient has had an uncomplicated hospital  course and is recovering well. The femoral catheter site is stable. She has been seen by Dr. Radford Pax today and deemed ready for discharge home. All follow-up appointments have been scheduled. A work excuse note was provided as well. Discharge medications include full dose ASA, coreg 3.125 mg BID, Zocor 20mg , and xanax. She has been told to discontinue Mobic and sumatriptan and try to use Tylenol for her HAs. An ACEi for LV dysfunction may be added at follow up appointment if her BP tolerates it.  Outstanding studies: Repeat ECHO in 1 week and non contrast chest CT for right sided pulmonary nodule in 12 months ( although she is low risk as she has never smoked).   Discharge Vitals: Blood pressure 110/68, pulse 78, temperature 98 F (36.7 C), temperature source Oral, resp. rate 18, height 4\' 11"  (1.499 m), weight 160 lb (72.576 kg), SpO2 99.00%.  Labs: Lab Results  Component Value Date   WBC 8.2 08/20/2013   HGB 12.5 08/20/2013   HCT 35.5* 08/20/2013   MCV 74.3* 08/20/2013   PLT 243 08/20/2013    Recent Labs Lab 08/17/13 2123  08/20/13 0230  NA 138  < > 139  K 3.4*  < > 3.9  CL 98  < > 101  CO2 19  < > 24  BUN 10  < > 10  CREATININE 0.78  < > 0.77  CALCIUM 9.1  < > 9.6  PROT 7.0  --   --   BILITOT <0.2*  --   --   ALKPHOS 44  --   --   ALT 11  --   --   AST 19  --   --   GLUCOSE 179*  < > 95  < > = values in this interval not displayed.  Lab Results  Component Value Date   CHOL 173 08/18/2013   HDL 65 08/18/2013   LDLCALC 90 08/18/2013   TRIG 90 08/18/2013     Diagnostic Studies/Procedures   Ct Coronary Morp W/cta Cor W/score W/ca W/cm &/or Wo/cm  08/21/2013   ADDENDUM REPORT: 08/21/2013 14:20  CLINICAL DATA:  Coronary artery dissection  EXAM: Cardiac CTA  MEDICATIONS: Sub lingual nitro. 4mg  and lopressor 5mg   TECHNIQUE: The patient was scanned on a Philips 536 slice scanner. Gantry rotation speed was 270 msecs. Collimation was .56mm. A 100 kV prospective scan was triggered in the descending  thoracic aorta at 111 HU's with 5% padding centered around 78% of the R-R interval. Average HR during the scan was 68 bpm. The 3D data set was interpreted on a dedicated work station using MPR, MIP and VRT modes. A total of 80cc of contrast was used.  FINDINGS: Non-cardiac: See separate report from [facility]. No significant findings on limited lung and soft tissue windows.  Calcium Score: 0  Coronary Arteries: Right dominant with no anomalies  LM: Normal  LAD: Normal proximally some tapering of the lumen after the 2nd diagonal branch but normal antegrade flow Distal vessel poorly visualized  D1:  Normal  D2: Normal  Circumflex: Normal  OM1: Normal  OM2: Normal  RCA:  Dominant and normal  PDA: Normal  PLA:  Normal  IMPRESSION: 1) Review of heart cath showed concern for retrograde dissection  Of the mid LAD back to LM CT shows LM and proximal LAD to be widely patent Some tapering of the mid LAD with no intramyocardial course and good antegrade flow to distal vessel  2) Calcium score 0  3) Normal RCA/Circumflex  4) No mural apical thrombus  5) Normal ascending aorta 2.5 cm  6) See note radiology regarding subpleural nodule, benign liver cyst and chololithiasis     08/21/2013   EXAM: OVER-READ INTERPRETATION  CT CHEST  The following report is an over-read performed by radiologist Dr. Alvino Blood [facility] on 08/21/2013. This over-read does not include interpretation of cardiac or coronary anatomy or pathology. The coronary CTA interpretation by the cardiologist is attached.  COMPARISON:  None.  FINDINGS: There is a 4 mm subpleural nodule along the right horizontal fissure. Similar 4 mm nodule along the right oblique fissure.  No axillary or supraclavicular lymphadenopathy. Small amount of residual thymus remains in anterior mediastinum. No pericardial fluid.  There is a low-density lesion in the right hepatic lobe without enhancement consistent benign hepatic cysts. There are multiple  gallstones within the gallbladder. Limited view of the skeleton is unremarkable  IMPRESSION: 1. Small subpleural nodules the right lung. If patient has smoking history recommend follow-up noncontrast chest CT in 12 months per Fleischner criteria. 2. Cholelithiasis.       PROCEDURE: Left heart catheterization with selective coronary angiography, left ventriculogram.  INDICATIONS: Anterior STEMI  The risks, benefits, and details of the procedure were explained to the patient. The patient verbalized understanding and wanted to proceed. Informed written consent was obtained.  PROCEDURE TECHNIQUE: After Xylocaine anesthesia a 2F sheath was placed in the right femoral artery with a single anterior needle wall stick. Left coronary angiography was done using a Judkins L 3 guide catheter. Right coronary angiography was done using a Judkins R4 guide catheter. Left ventriculography was done using a pigtail catheter. The sheath was left in to a pressure bag with Heparin.  CONTRAST: Total of 45 cc.  COMPLICATIONS: None.  HEMODYNAMICS: Aortic pressure was 124/82; LV pressure was 120/16; LVEDP 22. There was no gradient between the left ventricle and aorta.  ANGIOGRAPHIC DATA: The left main coronary artery is diffusely narrowed. The narrowing is worse at the ostium. The left main has the appearance of a spontaneous dissection.  The left anterior descending artery is a large vessel which wraps around the apex. The dissection which is in the left main is also present in the LAD. There is a large first diagonal which appears to have an irregularity at the very proximal portion. The mid LAD narrows very significantly. The distal LAD appears angiographically normal and widely patent. I suspect the dissection originated in the mid LAD and propagated retrograde back to the left main. There is a second diagonal which is also diffusely narrowed but patent and with TIMI-3 flow.  The left circumflex artery is a large vessel.  There 2 large obtuse marginal vessels which are widely patent. The circumflex vessel appears angiographically normal. There is no evidence of dissection in the circumflex.  The right coronary artery is a large dominant vessel. The posterior lateral artery is medium size and widely patent the posterior descending artery is large and patent. The vessel is angiographically normal.  LEFT  VENTRICULOGRAM: Left ventricular angiogram was done in the 30 RAO projection and revealed a mid to distal anterior and apical wall motion abnormality. Moderately decreased systolic function with an estimated ejection fraction of 35 %. LVEDP was 22 mmHg.  IMPRESSIONS:  5. 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 TIMI-3 flow in the LAD and all its branches. 6. Normal left circumflex artery and its branches. 7. Normal right coronary artery. 8. Moderately decreased left ventricular systolic function. LVEDP 22 mmHg. Ejection fraction 35 %. RECOMMENDATION: She'll be watched in the ICU. Will avoid IV heparin and IV 2bIIIa inhibitor to prevent worsening of the intramural hematoma. Will give full dose aspirin. I discussed the case with Dr. Prescott Gum. Will hold off on Plavix at this time. If the patient decompensates later, we'll plan repeat angiogram and potential emergent bypass surgery.  Beta blocker to keep heart rate and blood pressure under control. We'll also give anxiolytic medicine as necessary.  If she remains stable overnight, plan to pull the sheath tomorrow morning.  I spoke to the family at length. We spoke about the variable clinical courses that can happen with SCAD. Hopefully, she will be stable through the next 48 hours. If her pain comes back, we may have to repeat angiogram and potentially consider CABG emergently. A percutaneous approach may entail a very long area of stent, and would jeopardize the large  circumflex. THe husband and mother-in-law are aware. Will try to keep BP and HR controlled. Use Xanax for anxiety as well.     Discharge Medications     Medication List    STOP taking these medications       aspirin-acetaminophen-caffeine 250-250-65 MG per tablet  Commonly known as:  EXCEDRIN MIGRAINE     meloxicam 7.5 MG tablet  Commonly known as:  MOBIC     SUMAtriptan 100 MG tablet  Commonly known as:  IMITREX      TAKE these medications       acetaminophen 325 MG tablet  Commonly known as:  TYLENOL  Take 2 tablets (650 mg total) by mouth every 4 (four) hours as needed for headache or mild pain.     albuterol 108 (90 BASE) MCG/ACT inhaler  Commonly known as:  PROVENTIL HFA;VENTOLIN HFA  Inhale 2 puffs into the lungs every 6 (six) hours as needed for wheezing or shortness of breath.     ALPRAZolam 0.25 MG tablet  Commonly known as:  XANAX  Take 1 tablet (0.25 mg total) by mouth 3 (three) times daily as needed for anxiety.     aspirin 325 MG EC tablet  Take 1 tablet (325 mg total) by mouth daily.     carvedilol 3.125 MG tablet  Commonly known as:  COREG  Take 1 tablet (3.125 mg total) by mouth 2 (two) times daily with a meal.     multivitamin tablet  Take 1 tablet by mouth daily.     nitroGLYCERIN 0.4 MG SL tablet  Commonly known as:  NITROSTAT  Place 1 tablet (0.4 mg total) under the tongue every 5 (five) minutes x 3 doses as needed for chest pain.     simvastatin 20 MG tablet  Commonly known as:  ZOCOR  Take 1 tablet (20 mg total) by mouth daily at 6 PM.        Disposition   The patient will be discharged in stable condition to home.  Follow-up Information  Follow up with Lyda Jester, PA-C On 09/06/2013. (@ 9:30am)    Specialty:  Cardiology   Contact information:   [place]. [place] [address] [phone]       Follow up with [facility] On 08/28/2013. (@ 11am for an ultrasound of your heart)    Contact  information:   [address] [place] [address] [phone]        Duration of Discharge Encounter: Greater than 30 minutes including physician and PA time.  SignedVertell Limber, Advika Mclelland PA-C 08/22/2013, 12:22 PM

## 2013-08-22 NOTE — Progress Notes (Signed)
SUBJECTIVE:  No further CP  OBJECTIVE:   Vitals:   Filed Vitals:   08/21/13 1400 08/21/13 1428 08/21/13 2053 08/22/13 0520  BP:  102/69 107/41 98/41  Pulse:  70 69 76  Temp:  98 F (36.7 C) 98 F (36.7 C) 98 F (36.7 C)  TempSrc:  Oral Oral Oral  Resp: 17 16 18 18   Height:  4\' 11"  (1.499 m)    Weight:  150 lb (68.04 kg)  160 lb (72.576 kg)  SpO2:  97% 100% 99%   I&O's:   Intake/Output Summary (Last 24 hours) at 08/22/13 0843 Last data filed at 08/22/13 0544  Gross per 24 hour  Intake    240 ml  Output   1350 ml  Net  -1110 ml   TELEMETRY: Reviewed telemetry pt in NSR:     PHYSICAL EXAM General: Well developed, well nourished, in no acute distress Head: Eyes PERRLA, No xanthomas.   Normal cephalic and atramatic  Lungs:   Clear bilaterally to auscultation and percussion. Heart:   HRRR S1 S2 Pulses are 2+ & equal. Abdomen: Bowel sounds are positive, abdomen soft and non-tender without masses Extremities:   No clubbing, cyanosis or edema.  DP +1 Neuro: Alert and oriented X 3. Psych:  Good affect, responds appropriately   LABS: Basic Metabolic Panel:  Recent Labs  08/20/13 0230  NA 139  K 3.9  CL 101  CO2 24  GLUCOSE 95  BUN 10  CREATININE 0.77  CALCIUM 9.6   Liver Function Tests: No results found for this basename: AST, ALT, ALKPHOS, BILITOT, PROT, ALBUMIN,  in the last 72 hours No results found for this basename: LIPASE, AMYLASE,  in the last 72 hours CBC:  Recent Labs  08/20/13 0230  WBC 8.2  HGB 12.5  HCT 35.5*  MCV 74.3*  PLT 243   Cardiac Enzymes: No results found for this basename: CKTOTAL, CKMB, CKMBINDEX, TROPONINI,  in the last 72 hours BNP: No components found with this basename: POCBNP,  D-Dimer: No results found for this basename: DDIMER,  in the last 72 hours Hemoglobin A1C: No results found for this basename: HGBA1C,  in the last 72 hours Fasting Lipid Panel: No results found for this basename: CHOL, HDL, LDLCALC, TRIG,  CHOLHDL, LDLDIRECT,  in the last 72 hours Thyroid Function Tests: No results found for this basename: TSH, T4TOTAL, FREET3, T3FREE, THYROIDAB,  in the last 72 hours Anemia Panel: No results found for this basename: VITAMINB12, FOLATE, FERRITIN, TIBC, IRON, RETICCTPCT,  in the last 72 hours Coag Panel:   Lab Results  Component Value Date   INR 0.96 08/17/2013    RADIOLOGY: Ct Coronary Morp W/cta Cor W/score W/ca W/cm &/or Wo/cm  08/21/2013   ADDENDUM REPORT: 08/21/2013 14:20  CLINICAL DATA:  Coronary artery dissection  EXAM: Cardiac CTA  MEDICATIONS: Sub lingual nitro. 4mg  and lopressor 5mg   TECHNIQUE: The patient was scanned on a Philips 357 slice scanner. Gantry rotation speed was 270 msecs. Collimation was .41mm. A 100 kV prospective scan was triggered in the descending thoracic aorta at 111 HU's with 5% padding centered around 78% of the R-R interval. Average HR during the scan was 68 bpm. The 3D data set was interpreted on a dedicated work station using MPR, MIP and VRT modes. A total of 80cc of contrast was used.  FINDINGS: Non-cardiac: See separate report from [facility]. No significant findings on limited lung and soft tissue windows.  Calcium Score: 0  Coronary Arteries: Right dominant  with no anomalies  LM: Normal  LAD: Normal proximally some tapering of the lumen after the 2nd diagonal branch but normal antegrade flow Distal vessel poorly visualized  D1: Normal  D2: Normal  Circumflex: Normal  OM1: Normal  OM2: Normal  RCA:  Dominant and normal  PDA: Normal  PLA:  Normal  IMPRESSION: 1) Review of heart cath showed concern for retrograde dissection  Of the mid LAD back to LM CT shows LM and proximal LAD to be widely patent Some tapering of the mid LAD with no intramyocardial course and good antegrade flow to distal vessel  2) Calcium score 0  3) Normal RCA/Circumflex  4) No mural apical thrombus  5) Normal ascending aorta 2.5 cm  6) See note radiology regarding subpleural nodule, benign  liver cyst and chololithiasis  Jenkins Rouge   Electronically Signed   By: Jenkins Rouge M.D.   On: 08/21/2013 14:20   08/21/2013   EXAM: OVER-READ INTERPRETATION  CT CHEST  The following report is an over-read performed by radiologist Dr. Alvino Blood [facility] on 08/21/2013. This over-read does not include interpretation of cardiac or coronary anatomy or pathology. The coronary CTA interpretation by the cardiologist is attached.  COMPARISON:  None.  FINDINGS: There is a 4 mm subpleural nodule along the right horizontal fissure. Similar 4 mm nodule along the right oblique fissure.  No axillary or supraclavicular lymphadenopathy. Small amount of residual thymus remains in anterior mediastinum. No pericardial fluid.  There is a low-density lesion in the right hepatic lobe without enhancement consistent benign hepatic cysts. There are multiple gallstones within the gallbladder. Limited view of the skeleton is unremarkable  IMPRESSION: 1. Small subpleural nodules the right lung. If patient has smoking history recommend follow-up noncontrast chest CT in 12 months per Fleischner criteria. 2. Cholelithiasis.  Electronically Signed: By: Suzy Bouchard M.D. 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 Of not she had taken imitrex within 48 hours of incident and severe excedrin migraine tablets the day before and day of MI.   2 STEMI  3 ICM  4 Small subpleural lung nodule  PLAN: No further CP. Continue Aspirin.  Continue coreg 3.125 mg po BID. Cannot add ACEI at this point due to hypotension. Dr. Stanford Breed reviewed cardiac CTA with Dr Johnsie Cancel and Dr Meda Coffee; there appears to be no compromise of LM or LAD; dissection is not apparent.  DC home today. Echo 1 week to reassess LV function and wall motion (previous Vgram showed apical WMA; we have not anticoagulated to decrease risk of apical thrombus because of concern of worsening  intramural hematoma from coronary dissection). She has been counseled to avoid Imitrex and to followup with her headache MD for other treatment options for migraine HA.  FU Dr Irish Lack.  Needs followup Chest CT in 12 months to followup on lung nodule    Sueanne Margarita, MD  08/22/2013  8:43 AM

## 2013-08-22 NOTE — Discharge Instructions (Signed)
Cardiac Cath Site Care Refer to this sheet in the next few weeks. These instructions provide you with information on caring for yourself after your procedure. Your caregiver may also give you more specific instructions. Your treatment has been planned according to current medical practices, but problems sometimes occur. Call your caregiver if you have any problems or questions after your procedure. HOME CARE INSTRUCTIONS  You may shower the day after the procedure.Remove the bandage (dressing) and gently wash the site with plain soap and water.Gently pat the site dry.  Do not apply powder or lotion to the site.  Do not submerge the affected site in water for 3 to 5 days.  Inspect the site at least twice daily.  Do not flex or bend the affected arm for 24 hours.  No lifting over 5 pounds (2.3 kg) for 5 days after your procedure.  Do not drive home if you are discharged the same day of the procedure. Have someone else drive you.  You may drive 24 hours after the procedure unless otherwise instructed by your caregiver.  Do not operate machinery or power tools for 24 hours.  A responsible adult should be with you for the first 24 hours after you arrive home. What to expect:  Any bruising will usually fade within 1 to 2 weeks.  Blood that collects in the tissue (hematoma) may be painful to the touch. It should usually decrease in size and tenderness within 1 to 2 weeks. SEEK IMMEDIATE MEDICAL CARE IF:  You have unusual pain at the radial site.  You have redness, warmth, swelling, or pain at the radial site.  You have drainage (other than a small amount of blood on the dressing).  You have chills.  You have a fever or persistent symptoms for more than 72 hours.  You have a fever and your symptoms suddenly get worse.  Your arm becomes pale, cool, tingly, or numb.  You have heavy bleeding from the site. Hold pressure on the site. Document Released: 03/07/2010 Document Revised:  04/27/2011 Document Reviewed: 03/07/2010 [facility] Patient Information 2015 [place], [place]. This information is not intended to replace advice given to you by your health care provider. Make sure you discuss any questions you have with your health care provider.

## 2013-08-22 NOTE — Discharge Summary (Signed)
Agree withy discharge summary as outlined by Perry Mount, PA-C

## 2013-08-28 ENCOUNTER — Ambulatory Visit (HOSPITAL_COMMUNITY)
Admit: 2013-08-28 | Discharge: 2013-08-28 | Disposition: A | Discharge status: Home / Self Care | Payer: Managed Care, Other (non HMO) | Source: Ambulatory Visit | Attending: Cardiology | Admitting: Cardiology

## 2013-08-28 DIAGNOSIS — I2109 ST elevation (STEMI) myocardial infarction involving other coronary artery of anterior wall: Secondary | ICD-10-CM

## 2013-08-28 DIAGNOSIS — I2542 Coronary artery dissection: Secondary | ICD-10-CM

## 2013-08-28 DIAGNOSIS — I219 Acute myocardial infarction, unspecified: Secondary | ICD-10-CM

## 2013-08-28 NOTE — Progress Notes (Signed)
2D Echocardiogram Complete.  08/28/2013   Caspar Favila, RDCS  

## 2013-09-06 ENCOUNTER — Ambulatory Visit (INDEPENDENT_AMBULATORY_CARE_PROVIDER_SITE_OTHER): Payer: Managed Care, Other (non HMO) | Admitting: Cardiology

## 2013-09-06 VITALS — BP 118/72 | HR 69 | Ht 59.0 in | Wt 160.0 lb

## 2013-09-06 DIAGNOSIS — I2542 Coronary artery dissection: Secondary | ICD-10-CM

## 2013-09-06 NOTE — Patient Instructions (Signed)
Your physician recommends that you schedule a follow-up appointment in: Follow-up with Dr Irish Lack at the [address] location

## 2013-09-06 NOTE — Progress Notes (Signed)
Patient ID: [name], female   DOB: 07-16-1970, 43 y.o.   MRN: [id number]    09/06/2013 [name]   October 20, 1970  [id number]  Primary Physicia Pcp Not In System Primary Cardiologist: Dr. Irish Lack  [name] presents to clinic today for post hospital followup after being treated for spontaneous coronary artery dissection (SCAD). Details regarding her history and recent hospital course are outlined in detail below.  HPI:  [name] is a 43 y.o. female with a history of cervical disc disease, migraines and no past cardiac history who presented to [facility] on 08/17/13 with complaints of substernal chest pain and was found to have anterolateral ST elevation and positive troponin ( peak >20). A code STEMI was activated by EMS and she was taken for emergent cardiac cath. A left heart catheterization was performed by Dr. Irish Lack, revealing spontaneous coronary artery dissection which likely originated in the mid LAD and property retrograde back to the ostium of the left main. TIMI-3 flow in the LAD and all its branches. She had a normal left circumflex artery as well as normal right coronary artery system. She was noted to have severe systolic dysfunction with an ejection fraction of 35% with left ventricular apical hypokinesis. It was decided not to pursue PCI. It was felt that invevention may entail a very long area of stent, and would jeopardize the large circumflex. It was decided to monitor her in ICU and to pursue possible repeat angiogram and potential emergent bypass surgery if she demonstrated signs of decompensation. CTCS was consulted for recommendations. They recommended against the use of Plavix in case she would need urgent surgical treatment. Dr. Irish Lack opted to also avoid IV heparin and IV IIbIIIa inhibitor to prevent worsening of the intramural hematoma. He prescribed a beta blocker to keep her heart rate and blood pressure under control. She was also prescribed PRN anxiolytic  medication. She remained stable in the ICU without any significant recurrent chest pain and no EKG changes. It was later decided to plan for coronary CTA to see whether her dissection had improved. This was reviewed by Dr. Stanford Mosley, Dr [name] and Dr. Meda Mosley. There appeared to be no compromise of the left main or LAD. Her dissection was not apparent. She was then transferred to telemetry for further observation. She had no further issues and she was discharged 08/22/2013. She was orrdered to get a repeat 2-D echocardiogram to reassess systolic function. This was performed 08/28/2013, revealing improvement of systolic function back to normal with an ejection fraction of 50-55%.  She presents to clinic today for followup. States that she has been doing fairly well. She denies any recurrent chest pain. No shortness of breath, dizziness, syncope or near-syncope. She does however note feeling moore fatigue than usual. She states she has been fully compliant with her medications. Her only concern is that she has noticed heavier flow with her menstrual period this month. She was discharged home of full dose ASA.     Current Outpatient Prescriptions  Medication Sig Dispense Refill  . acetaminophen (TYLENOL) 325 MG tablet Take 2 tablets (650 mg total) by mouth every 4 (four) hours as needed for headache or mild pain.  60 tablet  11  . albuterol (PROVENTIL HFA;VENTOLIN HFA) 108 (90 BASE) MCG/ACT inhaler Inhale 2 puffs into the lungs every 6 (six) hours as needed for wheezing or shortness of breath.      . ALPRAZolam (XANAX) 0.25 MG tablet Take 1 tablet (0.25 mg total) by mouth 3 (three) times daily  as needed for anxiety.  30 tablet  0  . aspirin EC 325 MG EC tablet Take 1 tablet (325 mg total) by mouth daily.  30 tablet  0  . carvedilol (COREG) 3.125 MG tablet Take 1 tablet (3.125 mg total) by mouth 2 (two) times daily with a meal.  60 tablet  11  . meloxicam (MOBIC) 7.5 MG tablet As directed      . Multiple  Vitamin (MULTIVITAMIN) tablet Take 1 tablet by mouth daily.      . nitroGLYCERIN (NITROSTAT) 0.4 MG SL tablet Place 1 tablet (0.4 mg total) under the tongue every 5 (five) minutes x 3 doses as needed for chest pain.  25 tablet  12   No current facility-administered medications for this visit.    Allergies  Allergen Reactions  . Vicodin [Hydrocodone-Acetaminophen] Nausea And Vomiting    History   Social History  . Marital Status: Married    Spouse Name: [name]    Number of Children: 2  . Years of Education: 17+   Occupational History  . Clinical Social Work/Outpatient Therapist    Social History Main Topics  . Smoking status: Never Smoker   . Smokeless tobacco: Never Used  . Alcohol Use: No  . Drug Use: No  . Sexual Activity: Yes    Partners: Male    Birth Control/ Protection: Surgical   Other Topics Concern  . Not on file   Social History Narrative   Lives with husband, almost 44 year old daughter and 52 year old son.     Review of Systems: General: negative for chills, fever, night sweats or weight changes.  Cardiovascular: negative for chest pain, dyspnea on exertion, edema, orthopnea, palpitations, paroxysmal nocturnal dyspnea or shortness of breath Dermatological: negative for rash Respiratory: negative for cough or wheezing Urologic: negative for hematuria Abdominal: negative for nausea, vomiting, diarrhea, bright red blood per rectum, melena, or hematemesis Neurologic: negative for visual changes, syncope, or dizziness All other systems reviewed and are otherwise negative except as noted above.    Blood pressure 118/72, pulse 69, height 4\' 11"  (1.499 m), weight 160 lb (72.576 kg).  General appearance: alert, cooperative and no distress Neck: no carotid bruit and no JVD Lungs: clear to auscultation bilaterally Heart: regular rate and rhythm Extremities: no LEE Pulses: 2+ and symmetric Skin: warm and dry Neurologic: Grossly normal  EKG NSR HR 69  bpm  ASSESSMENT AND PLAN:   1.SCAD: Followup coronary CTA, several days after initial catheterization, revealed no compromise to the left main or LAD. No apparent dissection was seen. She denies any recurrent chest pain symptoms. She also denies dyspnea, dizziness, syncope/near-syncope. Her EKG is w/o ischemic changes. Systolic function has improved from 35% at time of catheterization to 50-55%. Continue medical therapy with ASA and beta blocker. Her blood pressure still too soft for initiating ACE inhibitor. She is eager to return back to physical activity (going to gym), however I stated that I would like for her to followup with Dr. Lacie Draft before resuming full physical activity. She is in agreement to this. She was instructed to notify our office/ seek emergent medical evaluation if she develops any recurrent chest pain.  PLAN  Continue with medical therapy with aspirin plus beta blocker. F/u with Dr. Irish Lack in 2 weeks. Avoid strenuous activity until seen in followup by Dr. Irish Lack.  Michaelyn Wall, BRITTAINYPA-C 09/06/2013 6:02 PM

## 2013-09-08 ENCOUNTER — Encounter: Payer: Self-pay | Admitting: Cardiology

## 2013-09-08 ENCOUNTER — Telehealth: Payer: Self-pay | Admitting: Cardiology

## 2013-09-08 NOTE — Telephone Encounter (Signed)
Received message from Lyda Jester, PA-C at [facility] office stating pt needed a follow up appt with Dr. Irish Lack to discuss going back to work. I spoke with pt and offered her an appt on 09/11/13 at 4 pm. Pt states she wanted me to check with Dr. Irish Lack first to see if she needed to come back in this soon for an appt. Pt states she has had upper back pain about every other day that is significant. Pt denies CP and SOB, but she will have an occasional twinge in her chest. Pt wants to know when she can go back to exercising and would she need to gradually increase exercise. Also, when can pt go back to work? Please advsie.

## 2013-09-11 ENCOUNTER — Ambulatory Visit: Payer: Managed Care, Other (non HMO) | Admitting: Interventional Cardiology

## 2013-09-12 NOTE — Telephone Encounter (Signed)
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

## 2013-09-12 NOTE — Telephone Encounter (Signed)
What does she do for a living?

## 2013-09-13 ENCOUNTER — Telehealth: Payer: Self-pay | Admitting: Interventional Cardiology

## 2013-09-13 NOTE — Telephone Encounter (Signed)
Would just have her walk for exercise at this point.  OK to go back to work if she feels up to it.  Will repeat echo in a few weeks.

## 2013-09-13 NOTE — Telephone Encounter (Signed)
lmtrc

## 2013-09-13 NOTE — Telephone Encounter (Signed)
Returned pt's call.

## 2013-09-13 NOTE — Telephone Encounter (Addendum)
Pt notified. Pt recently had echo 08/28/13. Do we still need to repeat? Also when would you like for pt to follow up?

## 2013-09-13 NOTE — Telephone Encounter (Signed)
Patient is returning your call. Please call back.  °

## 2013-09-15 NOTE — Telephone Encounter (Signed)
No need to repeat echo at this time.  Please work her in next week sometime to see me.

## 2013-09-19 NOTE — Telephone Encounter (Signed)
Pt notified. Appt made for 09/22/13.

## 2013-09-22 ENCOUNTER — Encounter: Payer: Self-pay | Admitting: Interventional Cardiology

## 2013-09-22 ENCOUNTER — Ambulatory Visit (INDEPENDENT_AMBULATORY_CARE_PROVIDER_SITE_OTHER): Payer: Managed Care, Other (non HMO) | Admitting: Interventional Cardiology

## 2013-09-22 VITALS — BP 110/70 | HR 72 | Ht 59.0 in | Wt 161.0 lb

## 2013-09-22 DIAGNOSIS — R911 Solitary pulmonary nodule: Secondary | ICD-10-CM

## 2013-09-22 DIAGNOSIS — I2109 ST elevation (STEMI) myocardial infarction involving other coronary artery of anterior wall: Secondary | ICD-10-CM

## 2013-09-22 DIAGNOSIS — I2542 Coronary artery dissection: Secondary | ICD-10-CM

## 2013-09-22 NOTE — Progress Notes (Signed)
Patient ID: [name], female   DOB: 1970-11-28, 43 y.o.   MRN: [id number]    [address], [place], [place]  [address] Phone: [phone] Fax:  [phone]  Date:  09/22/2013   ID:  [name], DOB 12-24-1970, MRN [id number]  PCP:  Pcp Not In System      History of Present Illness: [name] is a 43 y.o. female who had a spontaneous coronary artery dissection and July 2015. This likely started in her mid LAD and extended back into her left main and proximal LAD. She was managed medically. She had followup CT scan in the hospital showing resolution of the dissection. She was discharged. Followup echocardiogram showed apical akinesis with overall preserved left ventricular ejection fraction. She has done well since leaving the hospital. She has resumed walking. She has gone back to work couple of days a week. She has some days where she feels some low-level chest discomfort. It is nothing like what she had before she hospital. She has some days where she is fatigued. She has some flexibility in terms of returning back to work full time. Overall, she feels that she is doing well.   Wt Readings from Last 3 Encounters:  09/22/13 161 lb (73.029 kg)  09/06/13 160 lb (72.576 kg)  08/22/13 160 lb (72.576 kg)     Past Medical History  Diagnosis Date  . PMS (premenstrual syndrome)   . Acid reflux   . Menstrual migraine   . H/O varicella   . Monilial vaginitis 2005  . Ovarian cyst, left 2012  . Herniated disc, cervical   . Neuromuscular disorder   . Anxiety   . Coronary artery dissection     a. s/p LHC 08/17/13 SCAD which likely originated in the mid LAD and propagated retrograde back to the ostium of the left main. Treated medically.   . Lung nodule     a. incidental finding on CT 08/2013. Needs follow up in 08/2014  . LV dysfunction     a. EF 35 %. LV apical hypokinesia by cath 08/17/13    Current Outpatient Prescriptions  Medication Sig Dispense Refill  .  acetaminophen (TYLENOL) 325 MG tablet Take 2 tablets (650 mg total) by mouth every 4 (four) hours as needed for headache or mild pain.  60 tablet  11  . albuterol (PROVENTIL HFA;VENTOLIN HFA) 108 (90 BASE) MCG/ACT inhaler Inhale 2 puffs into the lungs every 6 (six) hours as needed for wheezing or shortness of breath.      Marland Kitchen aspirin EC 325 MG EC tablet Take 1 tablet (325 mg total) by mouth daily.  30 tablet  0  . carvedilol (COREG) 3.125 MG tablet Take 1 tablet (3.125 mg total) by mouth 2 (two) times daily with a meal.  60 tablet  11  . meloxicam (MOBIC) 7.5 MG tablet As directed      . Multiple Vitamin (MULTIVITAMIN) tablet Take 1 tablet by mouth daily.      . nitroGLYCERIN (NITROSTAT) 0.4 MG SL tablet Place 1 tablet (0.4 mg total) under the tongue every 5 (five) minutes x 3 doses as needed for chest pain.  25 tablet  12  . ALPRAZolam (XANAX) 0.25 MG tablet Take 1 tablet (0.25 mg total) by mouth 3 (three) times daily as needed for anxiety.  30 tablet  0   No current facility-administered medications for this visit.    Allergies:    Allergies  Allergen Reactions  . Vicodin [Hydrocodone-Acetaminophen] Nausea  And Vomiting    Social History:  The patient  reports that she has never smoked. She has never used smokeless tobacco. She reports that she does not drink alcohol or use illicit drugs.   Family History:  The patient's family history includes Breast cancer (age of onset: 48) in her paternal aunt; Cancer in her mother; Diabetes in her father; Hypertension in her brother, brother, and father; Uterine cancer in her mother.   ROS:  Please see the history of present illness.  No nausea, vomiting.  No fevers, chills.  No focal weakness.  No dysuria.   All other systems reviewed and negative.   PHYSICAL EXAM: VS:  BP 110/70  Pulse 72  Ht 4\' 11"  (1.499 m)  Wt 161 lb (73.029 kg)  BMI 32.50 kg/m2 Well nourished, well developed, in no acute distress HEENT: normal Neck: no JVD, no carotid  bruits Cardiac:  normal S1, S2; RRR;  Lungs:  clear to auscultation bilaterally, no wheezing, rhonchi or rales Abd: soft, nontender, no hepatomegaly Ext: no edema Skin: warm and dry Neuro:   no focal abnormalities noted  EKG:  Normal sinus rhythm, nonspecific ST segment changes inferiorly.     ASSESSMENT AND PLAN:  1. Spontaneous coronary artery dissection: Resolved by CT scan. Mild chest discomfort. Nonexertional.*CT attention if she feels like it is at all approaching what she had when she came to the hospital the last time. I have asked her resume walking. No strenuous aerobics classes. She is going to hold off on anything like that. After about a month, she can resume walking on elliptical or doing a stationary bike. She'll check her blood pressures periodically at home. 2. Prior anterior MI: Apical akinesis noted on echo. Not sure she will get full function back, but either way her LVEF has returned to normal range. Continue carvedilol. 3. Incidental lung nodule found on CT scan in July of 2015. Consider repeat CT scan in July of 2016.  Signed, Mina Marble, MD, [facility] 09/22/2013 9:01 AM

## 2013-09-22 NOTE — Patient Instructions (Signed)
Your physician recommends that you continue on your current medications as directed. Please refer to the Current Medication list given to you today.  Your physician recommends that you schedule a follow-up appointment in: 4 months with Dr. Irish Lack.

## 2013-10-11 ENCOUNTER — Telehealth: Payer: Self-pay | Admitting: Interventional Cardiology

## 2013-10-11 DIAGNOSIS — R002 Palpitations: Secondary | ICD-10-CM

## 2013-10-11 NOTE — Telephone Encounter (Signed)
Pt started to notice a dry cough over the last two weeks where she will start coughing and get short of breath.The cough is more pronounced when she talks a lot at work, which then causes shortness of breath where she has to take a deep breath in that is more difficult than it should. She feels that something in her chest will tighten. She has intermittent palpitations everyday and sometimes at night when she is lying in the bed. She has checked her HR and it is usually in the 70's, but her monitor reads irregular heartbeat.

## 2013-10-11 NOTE — Telephone Encounter (Signed)
New message      Pt thinks coreg is making her cough.  Could this be a side effect?

## 2013-10-12 NOTE — Telephone Encounter (Signed)
Plan for 24 hour holter monitor

## 2013-10-12 NOTE — Telephone Encounter (Signed)
Pt notified. 24 hour holter ordered. [facility] to schedule asap.

## 2013-10-13 ENCOUNTER — Encounter: Payer: Self-pay | Admitting: Radiology

## 2013-10-13 ENCOUNTER — Encounter (INDEPENDENT_AMBULATORY_CARE_PROVIDER_SITE_OTHER): Payer: Managed Care, Other (non HMO)

## 2013-10-13 DIAGNOSIS — R002 Palpitations: Secondary | ICD-10-CM

## 2013-10-13 NOTE — Progress Notes (Signed)
Patient ID: [name], female   DOB: 1970-12-07, 43 y.o.   MRN: [id number] [facility] 24hr holter applied

## 2013-10-20 ENCOUNTER — Telehealth: Payer: Self-pay | Admitting: Cardiology

## 2013-10-20 NOTE — Telephone Encounter (Signed)
Doubt it is Coreg; ok to switch to metoprolol 25 mg PO BID.

## 2013-10-20 NOTE — Telephone Encounter (Signed)
Dr. Hassell Done Interpretation of 24 Hour Holter [place]: Rare PVC's.

## 2013-10-20 NOTE — Telephone Encounter (Addendum)
Pt notified. Pt states symptoms are still presisting. She would like to know if the carvedilol could cause her dry cough and SOB. She still c/o the same symptoms (see last telephone note). Pt wants to know if it would be reasonable to change coreg to another beta blocker to see if this would help with her symptoms.

## 2013-10-24 NOTE — Telephone Encounter (Signed)
I spoke with pt and she felt better this weekend while she was away. She thinks most of her symptoms are from stress at work. She has a rx for xanax and she may try to use prn. Pt will stay on coreg at this time as she feels that symptoms are not coming from the medication

## 2013-11-13 ENCOUNTER — Other Ambulatory Visit: Payer: Self-pay | Admitting: *Deleted

## 2013-11-13 MED ORDER — CARVEDILOL 3.125 MG PO TABS: 3.1250 mg | ORAL_TABLET | Freq: Two times a day (BID) | ORAL | Status: DC

## 2013-12-04 ENCOUNTER — Encounter: Payer: Self-pay | Admitting: *Deleted

## 2013-12-04 ENCOUNTER — Telehealth: Payer: Self-pay | Admitting: Interventional Cardiology

## 2013-12-04 ENCOUNTER — Ambulatory Visit (INDEPENDENT_AMBULATORY_CARE_PROVIDER_SITE_OTHER): Payer: Managed Care, Other (non HMO) | Admitting: Physician Assistant

## 2013-12-04 VITALS — BP 112/62 | HR 71 | Ht 59.0 in | Wt 163.0 lb

## 2013-12-04 DIAGNOSIS — R911 Solitary pulmonary nodule: Secondary | ICD-10-CM

## 2013-12-04 DIAGNOSIS — R5383 Other fatigue: Secondary | ICD-10-CM

## 2013-12-04 DIAGNOSIS — I2542 Coronary artery dissection: Secondary | ICD-10-CM

## 2013-12-04 DIAGNOSIS — J45909 Unspecified asthma, uncomplicated: Secondary | ICD-10-CM | POA: Insufficient documentation

## 2013-12-04 DIAGNOSIS — J4520 Mild intermittent asthma, uncomplicated: Secondary | ICD-10-CM

## 2013-12-04 DIAGNOSIS — I519 Heart disease, unspecified: Secondary | ICD-10-CM

## 2013-12-04 DIAGNOSIS — I493 Ventricular premature depolarization: Secondary | ICD-10-CM | POA: Insufficient documentation

## 2013-12-04 MED ORDER — METOPROLOL TARTRATE 25 MG PO TABS: 12.5000 mg | ORAL_TABLET | Freq: Two times a day (BID) | ORAL | Status: DC

## 2013-12-04 NOTE — Telephone Encounter (Signed)
Pt states this has happened three times this fall.  Her current symptoms started over this past weekend. She did not go to work today because she felt so tired. She states denies any other symptoms except she did note some SOB loading clothes in the washer/dryer and some mild upper back pain. I reviewed with Dr Mare Ferrari and he recommended office visit with PA/NP today. Pt aware appt scheduled for her with Dayna Dunn,PA this afternoon at 3:30PM.

## 2013-12-04 NOTE — Telephone Encounter (Signed)
Pt states that every time it gets cold she gets crushing fatigue and tiredness.

## 2013-12-04 NOTE — Patient Instructions (Signed)
Please stop Coreg and start Lopressor 25 mg 1/2 tablet twice a day. Continue all other medications as listed.  Please have blood work today (TSH, CBC and anemia panel)  Follow up with Dr Irish Lack in 1 month.

## 2013-12-04 NOTE — Progress Notes (Signed)
[facility], [place], [place]  [address] Phone: [phone] Fax:  [phone]  Date:  12/04/2013   Patient ID:  [name], DOB [birth year], MRN [id number]   PCP:  Pcp Not In System  Cardiologist:  Dr. Irish Lack   History of Present Illness: [name] is a 43 y.o. 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 Her SCAD likely started in her mid LAD and extended back into her left main and proximal LAD. EF was 35% by cath. She had taken Imitrex and Excedrin the day before her MI. Her dissection was treated medically. Cardiac CT was performed afterwards which was reviewed by Dr. Stanford Breed, Dr Johnsie Cancel and Dr. Meda Coffee and it was felt there was no compromise of the left main or LAD. Her dissection was not apparent. F/u 2D echo 08/28/13: low normal EF 50-55%, akinesis of the apical myocardium, normal LV diastolic function parameters. She has had some continued intermittent chest discomfort and dyspnea since her event. This is nothing like her presenting dissection symptoms - that was much more severe and pronounced. She is back to exercising regularly and denies any symptoms during exercise. She notices she is spotting between periods since starting aspirin (she is s/p BTL). She primarily comes in today to discuss fatigue since the weather changed. Her primary doctor prescribed Dulera and albuterol PRN as she tends to get asthmatic bronchitis this time of year. No near-syncope, syncope, LEE, orthopnea, tachycardia, tachypnea.  Recent Labs: 08/17/2013: ALT 11  08/18/2013: HDL Cholesterol by NMR 65; LDL (calc) 90  08/20/2013: Creatinine 0.77; Hemoglobin 12.5; Potassium 3.9   Wt Readings from Last 3 Encounters:  12/04/13 163 lb (73.936 kg)  09/22/13 161 lb (73.029 kg)  09/06/13 160 lb (72.576 kg)     Past Medical History  Diagnosis Date  . PMS (premenstrual syndrome)   . Acid reflux   .  Menstrual migraine   . H/O varicella   . Monilial vaginitis 2005  . Ovarian cyst, left 2012  . Herniated disc, cervical   . Neuromuscular disorder   . Anxiety   . Coronary artery dissection     a. s/p LHC 08/17/13 SCAD which likely originated in the mid LAD and propagated retrograde back to the ostium of the left main. Treated medically.   . Lung nodule     a. incidental finding on CT 08/2013. Needs follow up in 08/2014  . LV dysfunction     a. EF 35 %. LV apical hypokinesia by cath 08/17/13  . PVC's (premature ventricular contractions)     Current Outpatient Prescriptions  Medication Sig Dispense Refill  . acetaminophen (TYLENOL) 325 MG tablet Take 2 tablets (650 mg total) by mouth every 4 (four) hours as needed for headache or mild pain.  60 tablet  11  . albuterol (PROVENTIL HFA;VENTOLIN HFA) 108 (90 BASE) MCG/ACT inhaler Inhale 2 puffs into the lungs every 6 (six) hours as needed for wheezing or shortness of breath.      . ALPRAZolam (XANAX) 0.25 MG tablet Take 1 tablet (0.25 mg total) by mouth 3 (three) times daily as needed for anxiety.  30 tablet  0  . aspirin EC 325 MG EC tablet Take 1 tablet (325 mg total) by mouth daily.  30 tablet  0  . carvedilol (COREG) 3.125 MG tablet Take 1 tablet (3.125 mg total) by mouth 2 (two) times daily with a meal.  180 tablet  0  .  meloxicam (MOBIC) 7.5 MG tablet As directed      . Multiple Vitamin (MULTIVITAMIN) tablet Take 1 tablet by mouth daily.      . nitroGLYCERIN (NITROSTAT) 0.4 MG SL tablet Place 1 tablet (0.4 mg total) under the tongue every 5 (five) minutes x 3 doses as needed for chest pain.  25 tablet  12   No current facility-administered medications for this visit.    Allergies:   Vicodin   Social History:  The patient  reports that she has never smoked. She has never used smokeless tobacco. She reports that she does not drink alcohol or use illicit drugs.   Family History:  The patient's family history includes Breast cancer (age of  onset: 59) in her paternal aunt; Diabetes in her father; Heart disease in her father; Hypertension in her brother, brother, and father; Uterine cancer in her mother.   ROS:  Please see the history of present illness.   All other systems reviewed and negative.   PHYSICAL EXAM:  VS:  BP 112/62  Pulse 71  Ht 4\' 11"  (1.499 m)  Wt 163 lb (73.936 kg)  BMI 32.90 kg/m2 Well nourished, well developed F in no acute distress HEENT: normal Neck: no JVD Cardiac:  normal S1, S2; RRR; no murmur Lungs:  clear to auscultation bilaterally, no wheezing, rhonchi or rales Abd: soft, nontender, no hepatomegaly Ext: no edema Skin: warm and dry Neuro:  moves all extremities spontaneously, no focal abnormalities noted  EKG:  NSR 71bpm nonspecific ST-T changes inferiorly as well as V3-V6 (very mild ST sagging) similar to prior  ASSESSMENT AND PLAN:  1. Fatigue - It's not clear this is associated with her cardiac status. Check TSH, CBC and anemia panel. I will change her from Coreg to metoprolol 12.5mg  BID to see if this helps with the fatigue, given her h/o asthmatic bronchitis. She also notes occasional cough. If this does not improve with changing to more selective BB (and addition of her asthmatic meds), she will need to f/u with her PCP for further evaluation. 2. Spontaneous coronary artery dissection with associated MI 08/2013 - her chest symptoms are somewhat atypical and non-exertional. She has not had anything like what prompted her 08/2013 admission. EKG is not significantly changed. She has no evidence of CHF or PE/DVT on exam. Will continue aspirin for now and surveillance for worsening symptoms. She is aware of return precautions. 3. LV dysfunction - improved LV function by f/u echo in July. 4. Incidental lung nodule found on CT scan 08/2013 - Consider repeat CT scan in July of 2016.  Dispo: F/u 1 month with Dr. Irish Lack to reassess.  Signed, Melina Copa, PA-C  12/04/2013 4:35 PM

## 2013-12-04 NOTE — Telephone Encounter (Signed)
New message     As the weather gets colder, she is having more weakness, fatigue and sob. She has noticed it everytime it gets cold.  When it warms up, she feels better.  Please call

## 2013-12-05 LAB — CBC
HCT: 40.3 % (ref 36.0–46.0)
Hemoglobin: 13.3 g/dL (ref 12.0–15.0)
MCHC: 33 g/dL (ref 30.0–36.0)
MCV: 77.2 fl — ABNORMAL LOW (ref 78.0–100.0)
Platelets: 260 10*3/uL (ref 150.0–400.0)
RBC: 5.22 Mil/uL — ABNORMAL HIGH (ref 3.87–5.11)
RDW: 14.2 % (ref 11.5–15.5)
WBC: 6.7 10*3/uL (ref 4.0–10.5)

## 2013-12-05 LAB — FOLATE: Folate: >24.8 ng/mL (ref >5.9)

## 2013-12-05 LAB — FERRITIN: Ferritin: 7.1 ng/mL — ABNORMAL LOW (ref 10.0–291.0)

## 2013-12-05 LAB — TSH: TSH: 0.84 u[IU]/mL (ref 0.35–4.50)

## 2013-12-05 LAB — VITAMIN B12: Vitamin B-12: 427 pg/mL (ref 211–911)

## 2013-12-06 ENCOUNTER — Encounter: Payer: Self-pay | Admitting: Physician Assistant

## 2013-12-06 DIAGNOSIS — R718 Other abnormality of red blood cells: Secondary | ICD-10-CM | POA: Insufficient documentation

## 2013-12-07 ENCOUNTER — Telehealth: Payer: Self-pay | Admitting: *Deleted

## 2013-12-07 ENCOUNTER — Other Ambulatory Visit (INDEPENDENT_AMBULATORY_CARE_PROVIDER_SITE_OTHER): Payer: Managed Care, Other (non HMO)

## 2013-12-07 DIAGNOSIS — D509 Iron deficiency anemia, unspecified: Secondary | ICD-10-CM | POA: Diagnosis not present

## 2013-12-07 LAB — IBC PANEL
Iron: 40 ug/dL — ABNORMAL LOW (ref 42–145)
Saturation Ratios: 9.7 % — ABNORMAL LOW (ref 20.0–50.0)
Transferrin: 293.5 mg/dL (ref 212.0–360.0)

## 2013-12-07 NOTE — Telephone Encounter (Signed)
pt notfied about lab results. Pt advised per Melina Copa, PA to f/u w/PCP about low iron level. Pt aware still waiting for [facility] lab to come in and we will let he know when those results are in. Pt wants results mailed to her.

## 2013-12-11 ENCOUNTER — Telehealth: Payer: Self-pay | Admitting: Interventional Cardiology

## 2013-12-11 NOTE — Telephone Encounter (Signed)
Calling stating she was called and told she was iron deficient.  She wanted to know if Dr. Irish Lack could give her Rx for iron without having expense of seeing another physician.  Advised that we were cardiologist and did not handle primary care medicine.  She states she sees [facility] and just hasn't made an appointment yet.  Advised her to follow up with them so they could discuss iron replacement and monitor results.  She verbalizes understanding and will call them.

## 2013-12-11 NOTE — Telephone Encounter (Signed)
New message      We told pt she was iron deficient.  She want to know why did we tell her to see another doctor for this---why could we not give her a presc so that she will not have to see multiple doctors?

## 2013-12-12 ENCOUNTER — Encounter: Payer: Self-pay | Admitting: Family Medicine

## 2013-12-12 ENCOUNTER — Ambulatory Visit (INDEPENDENT_AMBULATORY_CARE_PROVIDER_SITE_OTHER): Payer: Managed Care, Other (non HMO) | Admitting: Family Medicine

## 2013-12-12 VITALS — BP 109/70 | HR 76 | Temp 97.9°F | Resp 16 | Ht 58.5 in | Wt 166.0 lb

## 2013-12-12 DIAGNOSIS — D509 Iron deficiency anemia, unspecified: Secondary | ICD-10-CM

## 2013-12-12 MED ORDER — FERROUS SULFATE 325 (65 FE) MG PO TABS: 325.0000 mg | ORAL_TABLET | Freq: Two times a day (BID) | ORAL | Status: DC

## 2013-12-12 NOTE — Patient Instructions (Signed)
Iron Deficiency Anemia Anemia is a condition in which there are less red blood cells or hemoglobin in the blood than normal. Hemoglobin is the part of red blood cells that carries oxygen. Iron deficiency anemia is anemia caused by too little iron. It is the most common type of anemia. It may leave you tired and short of breath. CAUSES   Lack of iron in the diet.  Poor absorption of iron, as seen with intestinal disorders.  Intestinal bleeding.  Heavy periods. SIGNS AND SYMPTOMS  Mild anemia may not be noticeable. Symptoms may include:  Fatigue.  Headache.  Pale skin.  Weakness.  Tiredness.  Shortness of breath.  Dizziness.  Cold hands and feet.  Fast or irregular heartbeat. DIAGNOSIS  Diagnosis requires a thorough evaluation and physical exam by your health care provider. Blood tests are generally used to confirm iron deficiency anemia. Additional tests may be done to find the underlying cause of your anemia. These may include:  Testing for blood in the stool (fecal occult blood test).  A procedure to see inside the colon and rectum (colonoscopy).  A procedure to see inside the esophagus and stomach (endoscopy). TREATMENT  Iron deficiency anemia is treated by correcting the cause of the deficiency. Treatment may involve:  Adding iron-rich foods to your diet.  Taking iron supplements. Pregnant or breastfeeding women need to take extra iron because their normal diet usually does not provide the required amount.  Taking vitamins. Vitamin C improves the absorption of iron. Your health care provider may recommend that you take your iron tablets with a glass of orange juice or vitamin C supplement.  Medicines to make heavy menstrual flow lighter.  Surgery. HOME CARE INSTRUCTIONS   Take iron as directed by your health care provider.  If you cannot tolerate taking iron supplements by mouth, talk to your health care provider about taking them through a vein  (intravenously) or an injection into a muscle.  For the best iron absorption, iron supplements should be taken on an empty stomach. If you cannot tolerate them on an empty stomach, you may need to take them with food.  Do not drink milk or take antacids at the same time as your iron supplements. Milk and antacids may interfere with the absorption of iron.  Iron supplements can cause constipation. Make sure to include fiber in your diet to prevent constipation. A stool softener may also be recommended.  Take vitamins as directed by your health care provider.  Eat a diet rich in iron. Foods high in iron include liver, lean beef, whole-grain bread, eggs, dried fruit, and dark green leafy vegetables. SEEK IMMEDIATE MEDICAL CARE IF:   You faint. If this happens, do not drive. Call your local emergency services (911 in [place] if no other help is available.  You have chest pain.  You feel nauseous or vomit.  You have severe or increased shortness of breath with activity.  You feel weak.  You have a rapid heartbeat.  You have unexplained sweating.  You become light-headed when getting up from a chair or bed. MAKE SURE YOU:   Understand these instructions.  Will watch your condition.  Will get help right away if you are not doing well or get worse. Document Released: 01/31/2000 Document Revised: 02/07/2013 Document Reviewed: 10/10/2012 Exit[employer] Patient Information 2015 [employer]. This information is not intended to replace advice given to you by your health care provider. Make sure you discuss any questions you have with your health care provider.  

## 2013-12-12 NOTE — Progress Notes (Signed)
   Subjective:    Patient ID: [name], female    DOB: 1970/11/20, 43 y.o.   MRN: [id number]  HPI Patient presents today for follow up of low iron. She had an MI 08/17/13 and was started on ASA 325. She has a history of migraine and had the worst headache of her life the day before her MI and used Imitrex.  Since starting ASA, her periods have been much heavier and lasting 7 days. Prior to ASA therapy, they were normally 5 days and no excessive flow. Recently, has had to change pads/tampons every 1 hour, sometimes as frequently as every 20 minutes. She was seen by cardiology 12/04/13 for excessive fatigue and was found to have a decreased MCV, low iron and saturation levels. Ferritin and TSH were WNL. She is here today to discuss treatment.   Has history of asthmatic bronchitis. Started Dulera this week. Has used albuterol 2x. No wheezing, feels tight and coughs. Does cardio (treadmill, bike, elliptical), weights. She admits that her SOB and exercise tolerance have improved since MI, but are not yet at baseline.     Review of Systems No blood in stool, no dark stools, no dysuria/hematuria/frequency. Feels SOB with activity since MI. No pedal edema. No chest pain.     Objective:   Physical Exam  Vitals reviewed. Constitutional: She is oriented to person, place, and time. She appears well-developed and well-nourished.  HENT:  Head: Normocephalic and atraumatic.  Eyes: Conjunctivae are normal.  Neck: Normal range of motion. Neck supple.  Cardiovascular: Normal rate, regular rhythm, normal heart sounds and intact distal pulses.   Pulmonary/Chest: Effort normal and breath sounds normal.  Musculoskeletal: Normal range of motion. She exhibits no edema.  Neurological: She is alert and oriented to person, place, and time.  Skin: Skin is warm and dry.  Psychiatric: She has a normal mood and affect. Her behavior is normal. Judgment and thought content normal.      Assessment & Plan:  Discussed  with Dr. Everlene Farrier  1. Anemia, iron deficiency -Provided written and verbal information regarding diagnosis and treatment. -This seems likely related to increased blood loss during menstruation, will check hemoccult for GI source.  - ferrous sulfate 325 (65 FE) MG tablet; Take 1 tablet (325 mg total) by mouth 2 (two) times daily with a meal.  Dispense: 60 tablet; Refill: 5 - POC Hemoccult Bld/Stl (3-Cd Home Screen); Future - Follow up in 8-10 weeks, sooner if worsening symptoms.  Elby Beck, FNP-BC  [facility], [facility]  12/14/2013 9:53 PM

## 2013-12-18 ENCOUNTER — Encounter: Payer: Self-pay | Admitting: Family Medicine

## 2014-01-22 ENCOUNTER — Encounter: Payer: Self-pay | Admitting: Interventional Cardiology

## 2014-01-22 ENCOUNTER — Ambulatory Visit (INDEPENDENT_AMBULATORY_CARE_PROVIDER_SITE_OTHER): Payer: Managed Care, Other (non HMO) | Admitting: Interventional Cardiology

## 2014-01-22 VITALS — BP 98/58 | HR 65 | Ht 59.0 in | Wt 167.0 lb

## 2014-01-22 DIAGNOSIS — I252 Old myocardial infarction: Secondary | ICD-10-CM

## 2014-01-22 DIAGNOSIS — R0609 Other forms of dyspnea: Secondary | ICD-10-CM

## 2014-01-22 DIAGNOSIS — I2542 Coronary artery dissection: Secondary | ICD-10-CM

## 2014-01-22 MED ORDER — ASPIRIN EC 81 MG PO TBEC: 81.0000 mg | DELAYED_RELEASE_TABLET | Freq: Every day | ORAL | Status: DC

## 2014-01-22 NOTE — Patient Instructions (Signed)
Your physician has recommended you make the following change in your medication:   Decrease Aspirin to 81mg  by mouth daily.  Your physician has requested that you have an echocardiogram. Echocardiography is a painless test that uses sound waves to create images of your heart. It provides your doctor with information about the size and shape of your heart and how well your heart's chambers and valves are working. This procedure takes approximately one hour. There are no restrictions for this procedure.   Your physician wants you to follow-up in: 6 month with Dr. Irish Lack.You will receive a reminder letter in the mail two months in advance. If you don't receive a letter, please call our office to schedule the follow-up appointment.

## 2014-01-22 NOTE — Progress Notes (Signed)
Patient ID: [name], female   DOB: Apr 16, 1970, 43 y.o.   MRN: [id number]     [facility], [place], [place]  [address] Phone: [phone] Fax:  [phone]  Date:  01/22/2014   ID:  [name], DOB Jul 06, 1970, MRN [id number]  PCP:  Pcp Not In System      History of Present Illness: [name] is a 43 y.o. female who had a spontaneous coronary artery dissection and July 2015. This likely started in her mid LAD and extended back into her left main and proximal LAD. She was managed medically. She had followup CT scan in the hospital showing resolution of the dissection. She was discharged. Followup echocardiogram showed apical akinesis with overall preserved left ventricular ejection fraction. She has done well since leaving the hospital. She has resumed walking. She has gone back to work couple of days a week. She has some days where she feels some low-level chest discomfort. It is nothing like what she had before she hospital. She has some days where she is fatigued. She has some flexibility in terms of returning back to work full time. Overall, she feels that she is doing well.   Wt Readings from Last 3 Encounters:  01/22/14 167 lb (75.751 kg)  12/12/13 166 lb (75.297 kg)  12/04/13 163 lb (73.936 kg)     Past Medical History  Diagnosis Date  . PMS (premenstrual syndrome)   . Acid reflux   . Menstrual migraine   . H/O varicella   . Monilial vaginitis 2005  . Ovarian cyst, left 2012  . Herniated disc, cervical   . Neuromuscular disorder   . Anxiety   . Coronary artery dissection     a. s/p LHC 08/17/13 SCAD which likely originated in the mid LAD and propagated retrograde back to the ostium of the left main. Treated medically.   . Lung nodule     a. incidental finding on CT 08/2013. Needs follow up in 08/2014  . LV dysfunction     a. EF 35 %. LV apical hypokinesia by cath 08/17/13  . PVC's (premature ventricular contractions)   . Asthmatic bronchitis     a.  Typically when the weather changes.  . Microcytosis     a. Low ferritin 11/2013.    Current Outpatient Prescriptions  Medication Sig Dispense Refill  . albuterol (PROVENTIL HFA;VENTOLIN HFA) 108 (90 BASE) MCG/ACT inhaler Inhale 2 puffs into the lungs every 6 (six) hours as needed for wheezing or shortness of breath.    . ALPRAZolam (XANAX) 0.25 MG tablet Take 1 tablet (0.25 mg total) by mouth 3 (three) times daily as needed for anxiety. 30 tablet 0  . aspirin EC 325 MG EC tablet Take 1 tablet (325 mg total) by mouth daily. 30 tablet 0  . ATROVENT HFA 17 MCG/ACT inhaler     . DULERA 100-5 MCG/ACT AERO     . ferrous sulfate 325 (65 FE) MG tablet Take 1 tablet (325 mg total) by mouth 2 (two) times daily with a meal. 60 tablet 5  . meloxicam (MOBIC) 7.5 MG tablet As directed    . metoprolol tartrate (LOPRESSOR) 25 MG tablet Take 0.5 tablets (12.5 mg total) by mouth 2 (two) times daily. 45 tablet 6  . Multiple Vitamin (MULTIVITAMIN) tablet Take 1 tablet by mouth daily.    . nitroGLYCERIN (NITROSTAT) 0.4 MG SL tablet Place 1 tablet (0.4 mg total) under the tongue every 5 (five) minutes x 3 doses as  needed for chest pain. 25 tablet 12   No current facility-administered medications for this visit.    Allergies:    Allergies  Allergen Reactions  . Vicodin [Hydrocodone-Acetaminophen] Nausea And Vomiting    Social History:  The patient  reports that she has never smoked. She has never used smokeless tobacco. She reports that she does not drink alcohol or use illicit drugs.   Family History:  The patient's family history includes Breast cancer (age of onset: 47) in her paternal aunt; Diabetes in her father; Heart disease in her father; Hypertension in her brother, brother, and father; Uterine cancer in her mother.   ROS:  Please see the history of present illness.  No nausea, vomiting.  No fevers, chills.  No focal weakness.  No dysuria.   All other systems reviewed and negative.   PHYSICAL  EXAM: VS:  BP 98/58 mmHg  Pulse 65  Ht 4\' 11"  (1.499 m)  Wt 167 lb (75.751 kg)  BMI 33.71 kg/m2  SpO2 98% Well nourished, well developed, in no acute distress HEENT: normal Neck: no JVD, no carotid bruits Cardiac:  normal S1, S2; RRR;  Lungs:  clear to auscultation bilaterally, no wheezing, rhonchi or rales Abd: soft, nontender, no hepatomegaly Ext: no edema Skin: warm and dry Neuro:   no focal abnormalities noted Psych: normal affect  EKG:  Normal sinus rhythm, nonspecific ST segment changes inferiorly.     ASSESSMENT AND PLAN:  1. Spontaneous coronary artery dissection: Resolved by CT scan. Mild chest discomfort-similar to before. Nonexertional.  She should seek attention if she feels like it is at all approaching what she had when she came to the hospital the last time.She has resumed more strenuous aerobics. She can continue exercising on an elliptical or doing a stationary bike. She'll check her blood pressures periodically at home.  She can increase as her breathing and stamina all out. I would caution against heavy weight lifting. 2. Prior anterior MI: Apical akinesis noted on echo. Not sure she will get full function back, but either way her LVEF has returned to normal range. Continue low dose metoprolol.  Fatigue with carvedilol.  I am not sure that we would be able to titrate the beta blocker due to her low blood pressure and low heart rate along with tendency to have fatigue. Check echo given some fatigue and some DOE with exercise of late..  Worse than before but her exercise intensity is less than it was before.  3. Incidental lung nodule found on CT scan in July of 2015. Consider repeat CT scan in July of 2016. 4. Had some nervous stomach sx.  Decrease aspirin to 81 mg daily.  She should also use enteric-coated aspirin.  Signed, Mina Marble, MD, [facility] 01/22/2014 9:36 AM

## 2014-01-23 DIAGNOSIS — I252 Old myocardial infarction: Secondary | ICD-10-CM | POA: Insufficient documentation

## 2014-01-25 ENCOUNTER — Encounter (HOSPITAL_COMMUNITY): Payer: Self-pay | Admitting: Interventional Cardiology

## 2014-01-25 ENCOUNTER — Other Ambulatory Visit: Payer: Self-pay

## 2014-01-25 ENCOUNTER — Ambulatory Visit (HOSPITAL_COMMUNITY): Payer: Managed Care, Other (non HMO) | Attending: Cardiology | Admitting: Radiology

## 2014-01-25 DIAGNOSIS — R0600 Dyspnea, unspecified: Secondary | ICD-10-CM | POA: Diagnosis present

## 2014-01-25 DIAGNOSIS — R0609 Other forms of dyspnea: Secondary | ICD-10-CM

## 2014-01-25 NOTE — Progress Notes (Signed)
Echocardiogram performed.  

## 2014-01-26 ENCOUNTER — Other Ambulatory Visit: Payer: Self-pay

## 2014-01-26 MED ORDER — METOPROLOL TARTRATE 25 MG PO TABS: 12.5000 mg | ORAL_TABLET | Freq: Two times a day (BID) | ORAL | Status: DC

## 2014-02-02 ENCOUNTER — Other Ambulatory Visit: Payer: Self-pay | Admitting: *Deleted

## 2014-02-02 MED ORDER — METOPROLOL TARTRATE 25 MG PO TABS: 12.5000 mg | ORAL_TABLET | Freq: Two times a day (BID) | ORAL | Status: DC

## 2014-03-01 ENCOUNTER — Other Ambulatory Visit: Payer: Self-pay | Admitting: Obstetrics and Gynecology

## 2014-04-18 ENCOUNTER — Telehealth: Payer: Self-pay

## 2014-04-18 NOTE — Telephone Encounter (Signed)
Patient gets seen every year for asthmatic bronchitis and would like medication sent into the pharmacy. Patient states she is usually seen every year for this particular illness but doesn't have insurance. Please call patient if more details are needed. [phone] Pharmacy: [facility] on [name]

## 2014-04-18 NOTE — Telephone Encounter (Signed)
Pt called and was told the message, she said OK

## 2014-04-18 NOTE — Telephone Encounter (Signed)
She needs to RTC? Please advise.

## 2014-04-18 NOTE — Telephone Encounter (Signed)
She needs to be evaluated in the office. She can do a payment agreement, or if she pays in full, she gets a 55% discount.

## 2014-05-28 ENCOUNTER — Ambulatory Visit (INDEPENDENT_AMBULATORY_CARE_PROVIDER_SITE_OTHER): Payer: BLUE CROSS/BLUE SHIELD | Admitting: Internal Medicine

## 2014-05-28 ENCOUNTER — Encounter: Payer: Self-pay | Admitting: Internal Medicine

## 2014-05-28 VITALS — BP 102/66 | HR 77 | Ht <= 58 in | Wt 165.4 lb

## 2014-05-28 DIAGNOSIS — I252 Old myocardial infarction: Secondary | ICD-10-CM

## 2014-05-28 DIAGNOSIS — I519 Heart disease, unspecified: Secondary | ICD-10-CM

## 2014-05-28 DIAGNOSIS — G43009 Migraine without aura, not intractable, without status migrainosus: Secondary | ICD-10-CM | POA: Diagnosis not present

## 2014-05-28 DIAGNOSIS — I2542 Coronary artery dissection: Secondary | ICD-10-CM

## 2014-05-28 NOTE — Progress Notes (Signed)
OFFICE NOTE  Chief Complaint:  Follow-up of coronary artery disease   Primary Care Physician: Pcp Not In System  HPI:  [name] is a pleasant 44 year old female, who is a licensed [employer], and I'm meeting for the first time today. She is switching providers today for follow-up of coronary artery disease. Her past medical history significant for acute MI which occurred about a year ago. She underwent urgent cardiac catheterization and was found to have a spontaneous dissection of the LAD. Medical therapy was recommended. She's been maintained on aspirin and started on low-dose beta blocker. She also has a history of migraines. Dr. Irish Lack had follow her closely up until December of last year. He ordered a CT scan which showed no significant coronary disease - the LAD and left main were patent without any evidence for retrograde dissection which was noted on angiography. There was a small pulmonary nodule which will require follow-up CT in July. She reports some ongoing chest discomfort which she gets when she gets upset or when she rests after exercise, but is not associated with exercise.  PMHx:  Past Medical History  Diagnosis Date  . PMS (premenstrual syndrome)   . Acid reflux   . Menstrual migraine   . H/O varicella   . Monilial vaginitis 2005  . Ovarian cyst, left 2012  . Herniated disc, cervical   . Neuromuscular disorder   . Anxiety   . Coronary artery dissection     a. s/p LHC 08/17/13 SCAD which likely originated in the mid LAD and propagated retrograde back to the ostium of the left main. Treated medically.   . Lung nodule     a. incidental finding on CT 08/2013. Needs follow up in 08/2014  . LV dysfunction     a. EF 35 %. LV apical hypokinesia by cath 08/17/13  . PVC's (premature ventricular contractions)   . Asthmatic bronchitis     a. Typically when the weather changes.  . Microcytosis     a. Low ferritin 11/2013.    Past Surgical History    Procedure Laterality Date  . Wisdom tooth extraction    . Laparoscopic tubal ligation  02/20/2011    Procedure: LAPAROSCOPIC TUBAL LIGATION;  Surgeon: Alwyn Pea, MD;  Location: [facility];  Service: Gynecology;  Laterality: Bilateral;  with Removal of Mirena and Robotic Assisted Bilateral Tubal Ligation  . Tubal ligation  02/20/2011  . Robotic assisted laparoscopic ovarian cystectomy  02/2011  . Left heart catheterization with coronary angiogram Bilateral 08/17/2013    Procedure: LEFT HEART CATHETERIZATION WITH CORONARY ANGIOGRAM;  Surgeon: Jettie Booze, MD;  Location: [facility] CATH LAB;  Service: Cardiovascular;  Laterality: Bilateral;    FAMHx:  Family History  Problem Relation Age of Onset  . Uterine cancer Mother   . Breast cancer Paternal Aunt 30  . Hypertension Father   . Diabetes Father   . Hypertension Brother   . Hypertension Brother   . Heart disease Father     SOCHx:   reports that she has never smoked. She has never used smokeless tobacco. She reports that she does not drink alcohol or use illicit drugs.  ALLERGIES:  Allergies  Allergen Reactions  . Vicodin [Hydrocodone-Acetaminophen] Nausea And Vomiting    ROS: A comprehensive review of systems was negative except for: Cardiovascular: positive for chest pain  HOME MEDS: Current Outpatient Prescriptions  Medication Sig Dispense Refill  . ALPRAZolam (XANAX) 0.25 MG tablet Take 1 tablet (0.25 mg total)  by mouth 3 (three) times daily as needed for anxiety. 30 tablet 0  . aspirin 81 MG tablet Take 1 tablet (81 mg total) by mouth daily. 30 tablet 0  . ferrous sulfate 325 (65 FE) MG tablet Take 1 tablet (325 mg total) by mouth 2 (two) times daily with a meal. 60 tablet 5  . metoprolol tartrate (LOPRESSOR) 25 MG tablet Take 0.5 tablets (12.5 mg total) by mouth 2 (two) times daily. 90 tablet 1  . Multiple Vitamin (MULTIVITAMIN) tablet Take 1 tablet by mouth daily.    . nitroGLYCERIN (NITROSTAT) 0.4 MG SL tablet Place 1  tablet (0.4 mg total) under the tongue every 5 (five) minutes x 3 doses as needed for chest pain. 25 tablet 12   No current facility-administered medications for this visit.    LABS/IMAGING: No results found for this or any previous visit (from the past 48 hour(s)). No results found.  WEIGHTS: Wt Readings from Last 3 Encounters:  05/28/14 165 lb 6.4 oz (75.025 kg)  01/22/14 167 lb (75.751 kg)  12/12/13 166 lb (75.297 kg)    VITALS: BP 102/66 mmHg  Pulse 77  Ht 4\' 10"  (1.473 m)  Wt 165 lb 6.4 oz (75.025 kg)  BMI 34.58 kg/m2  EXAM: General appearance: alert and no distress Neck: no carotid bruit and no JVD Lungs: clear to auscultation bilaterally Heart: regular rate and rhythm, S1, S2 normal, no murmur, click, rub or gallop Abdomen: soft, non-tender; bowel sounds normal; no masses,  no organomegaly Extremities: extremities normal, atraumatic, no cyanosis or edema Pulses: 2+ and symmetric Skin: Skin color, texture, turgor normal. No rashes or lesions Neurologic: Grossly normal Psych: mildly anxious  EKG: Normal sinus rhythm at 77   ASSESSMENT: 1. 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 Migraine headaches 3. Anxiety   PLAN: 1.   Mrs. Twombly is describing some ongoing chest discomfort which she gets mostly at rest or when she gets upset . Nothing sounds like angina or with exertion. Her spontaneous dissection appears to have healed or at least there is evidence of a patent vessel by CT angiogram. As mentioned she will require repeat plain chest CT the summer for reevaluation of her pulmonary nodule. I offered order that today however she wanted to defer it for this time due to cost. I don't feel it should be limitation for activity and she should be able to go ahead and start doing more intensive exercise. She should continue on aspirin and beta blocker which may be helpful in preventing further events. She's not required any nitroglycerin. Conceivably  have spasms as she has a history of migraines. Beta blocker may be helpful.   I'm happy to see her back in 6 months to year or sooner as necessary.   Pixie Casino, MD, [facility] Attending Cardiologist [facility]  Stclair Szymborski C 05/28/2014, 4:58 PM

## 2014-05-28 NOTE — Patient Instructions (Signed)
Your physician wants you to follow-up in: 1 year with Dr. Debara Pickett. You will receive a reminder letter in the mail two months in advance. If you don't receive a letter, please call our office to schedule the follow-up appointment.  Dr. Debara Pickett recommends a follow up chest CT this summer

## 2014-09-25 ENCOUNTER — Telehealth: Payer: Self-pay | Admitting: Internal Medicine

## 2014-09-25 NOTE — Telephone Encounter (Signed)
Advise her to stop the metoprolol for the time being.  DR. Lemmie Evens

## 2014-09-25 NOTE — Telephone Encounter (Signed)
Please call,concerning her Metoprolol.Her blood pressure is real low and she stays tired and dizzy.

## 2014-09-25 NOTE — Telephone Encounter (Signed)
Spoke with pt, yesterday she got bp of 91/71 and 85/46. She is having dizziness when standing from sitting and feels off balance. She has blurred vision, she is sleeping a lot, confusion and feels anxious. She is exercising 1 to 2 x weekly but it takes her 2 days to recover. She has occ chest tightness at rest and PVC's that are usually associated to her cycle. She drinks plenty of water, does not eat salt. She did not take metoprolol last night and feels better today. She did not check her bp this morning. encouraged pt to eat some salt today and keep herself hydrated. Will forward for dr hilty's review

## 2014-09-26 NOTE — Telephone Encounter (Signed)
Patient notified of MD advice. She voiced understanding.

## 2015-01-08 ENCOUNTER — Telehealth: Payer: Self-pay | Admitting: *Deleted

## 2015-01-08 NOTE — Telephone Encounter (Signed)
[facility] & Dr. Cletis Media request clearance for:  1. Type of surgery: hyst/D&C under general in-office anesthesia 2. Date of surgery: 01/15/2015 3. Surgeon: Dr. Cletis Media 4. Medications that need to be held & how long: none specified - patient takes aspirin 81mg  daily  5. Fax and/or Phone: Phone: [phone]     Fax: [phone]  Needs last EKG, stress test, office note faxed as well

## 2015-01-08 NOTE — Telephone Encounter (Signed)
Low risk - ok to hold aspirin 7-10 days prior to the procedure.  Dr. Lemmie Evens

## 2015-01-08 NOTE — Telephone Encounter (Signed)
surgical clearance, last OV note, last EKG faxed.

## 2015-01-15 ENCOUNTER — Other Ambulatory Visit: Payer: Self-pay | Admitting: Obstetrics and Gynecology

## 2015-01-15 HISTORY — PX: COMBINED HYSTEROSCOPY DIAGNOSTIC / D&C: SUR297

## 2015-01-15 HISTORY — PX: HYSTEROSCOPY W/ ENDOMETRIAL ABLATION: SUR665

## 2015-01-30 ENCOUNTER — Encounter: Payer: Self-pay | Admitting: Physician Assistant

## 2015-02-19 ENCOUNTER — Other Ambulatory Visit: Payer: Self-pay | Admitting: Obstetrics and Gynecology

## 2015-02-19 DIAGNOSIS — N6489 Other specified disorders of breast: Secondary | ICD-10-CM

## 2015-02-22 ENCOUNTER — Other Ambulatory Visit: Payer: Managed Care, Other (non HMO)

## 2015-04-18 ENCOUNTER — Telehealth: Payer: Self-pay | Admitting: Internal Medicine

## 2015-04-18 NOTE — Telephone Encounter (Signed)
New Message  Pt c/o Shortness Of Breath: STAT if SOB developed within the last 24 hours or pt is noticeably SOB on the phone  1. Are you currently SOB (can you hear that pt is SOB on the phone)? Yes. But it is improving. Taken 2 Nitro's  2. How long have you been experiencing SOB? Has had it for a while but it has gotten worse within the last 3 weeks.  3. Are you SOB when sitting or when up moving around? Moving around. It also flairs in the middle of the night 4.  Are you currently experiencing any other symptoms? Not right now   Comments: Made appt for 05/07/2015 at 9:45a Pt req a call back to discuss if she will need an earlier appt//shanti

## 2015-04-18 NOTE — Telephone Encounter (Signed)
Returned call to patient.  She saw Dr. Debara Pickett as new patient last year following MI in 2015 from spontaneous LAD dissection.  She notes some ongoing SOB which has been occurrent she states since "at least November", possibly longer. Tends to note this w/ exercise or exertion. Feels like her "heart just can't keep up". She had also been using NTG which she states brought some improvement.  She does not have chest pain when she has her symptoms. She notes that when her symptoms are very bad, she gets a cough, congestion, some nausea with this. Also when symptoms are bad, she will lie down to rest and feel like a "vice grip releases" off her chest, she states she always has improvement.  She denies ankle swelling, but does report her abdomen "feels bloated" at times. She doesn't state SOB when supine, in fact this usually is when she is least SOB.  Note that she had stopped taking metoprolol in August following a call where her BPs were symptomatically low. She reports BPs currently around 109-117/76-82. Taking all meds except metoprolol. She had been on carvedilol in the past.  Pt was set up by scheduler to see Dr. Debara Pickett. Next appt on 3/21. She would prefer to see him, though I offered a PA flex appt for sooner. She had hesitation about this, as well as stating a concern for running any extensive testing, due to medical insurance limits/OOP costs.  Routed to Dr. Debara Pickett to review.

## 2015-04-18 NOTE — Telephone Encounter (Signed)
Recommendations communicated to patient, who voiced understanding. 

## 2015-04-18 NOTE — Telephone Encounter (Signed)
Happy to see her on 3/21, if she prefers soon, it is the flex clinic. If symptoms get progressively worse, she should go to the ER.  Dr. Lemmie Evens

## 2015-05-07 ENCOUNTER — Ambulatory Visit: Payer: Self-pay | Admitting: Internal Medicine

## 2015-05-17 ENCOUNTER — Ambulatory Visit (INDEPENDENT_AMBULATORY_CARE_PROVIDER_SITE_OTHER): Payer: PRIVATE HEALTH INSURANCE | Admitting: Internal Medicine

## 2015-05-17 ENCOUNTER — Encounter: Payer: Self-pay | Admitting: Internal Medicine

## 2015-05-17 VITALS — BP 114/84 | HR 78 | Ht 59.0 in | Wt 174.5 lb

## 2015-05-17 DIAGNOSIS — I252 Old myocardial infarction: Secondary | ICD-10-CM

## 2015-05-17 DIAGNOSIS — R079 Chest pain, unspecified: Secondary | ICD-10-CM | POA: Insufficient documentation

## 2015-05-17 DIAGNOSIS — I519 Heart disease, unspecified: Secondary | ICD-10-CM | POA: Diagnosis not present

## 2015-05-17 DIAGNOSIS — I2542 Coronary artery dissection: Secondary | ICD-10-CM

## 2015-05-17 DIAGNOSIS — R0609 Other forms of dyspnea: Secondary | ICD-10-CM | POA: Diagnosis not present

## 2015-05-17 DIAGNOSIS — R072 Precordial pain: Secondary | ICD-10-CM

## 2015-05-17 NOTE — Patient Instructions (Signed)
Your physician recommends that you schedule a follow-up appointment as needed.   Your physician recommends that you continue on your current medications as directed. Please refer to the Current Medication list given to you today.  

## 2015-05-17 NOTE — Progress Notes (Signed)
OFFICE NOTE  Chief Complaint:  Follow-up of coronary artery disease, chest pain, doe, abdominal bloating, fatigue  Primary Care Physician: Pcp Not In System  HPI:  [name] is a pleasant 45 year old female, who is a licensed [employer], and I'm meeting for the first time today. She is switching providers today for follow-up of coronary artery disease. Her past medical history significant for acute MI which occurred about a year ago. She underwent urgent cardiac catheterization and was found to have a spontaneous dissection of the LAD. Medical therapy was recommended. She's been maintained on aspirin and started on low-dose beta blocker. She also has a history of migraines. Dr. Irish Lack had follow her closely up until December of last year. He ordered a CT scan which showed no significant coronary disease - the LAD and left main were patent without any evidence for retrograde dissection which was noted on angiography. There was a small pulmonary nodule which will require follow-up CT in July. She reports some ongoing chest discomfort which she gets when she gets upset or when she rests after exercise, but is not associated with exercise. She felt like there was not a good therapeutic relationship with her and Dr. Irish Lack and therefore she switched providers.  [name] returns today for follow-up. She reports over the last several months she's had some worsening episodes of chest discomfort. These seem to come episodically and may be associated with her periods. She reported fatigue which seems to be cyclical and was thought to be due to heavy cycles and underwent endometrial ablation with some improvement. She also has episodes of severe chest discomfort shortness of breath and associated palpitations which come on without warning. She takes nitroglycerin with some relief. Recently she's had more of these episodes. She does not necessarily note these with exercise and does  continue to do some treadmill exercise. She says after she exercises however she has extreme fatigue which lasts for several days. She is not necessarily noted leg swelling but does report some abdominal bloating and weight gain. She was previously on a beta blocker but was taken off by [place] in August when she reported low blood pressures in the [id number] and 0000000 systolic. Since that time she feels like some of her other symptoms have worsened, particularly periodic chest pain episodes. It's not clear to me that this would indicate spontaneous dissection is her EKG shows no clear changes, but could represent coronary arteries spasm. She does have a very mild cardiomyopathy with EF 45-50% which had been stable since her cardiac event, but has been asymptomatic with this. We had recommended reevaluation of this but because of billing issues it was not performed. We last did a CT angiogram in 2015 which showed widely patent coronaries with a zero coronary artery calcium score - there was an incidental finding of a pulmonary nodule. I had recommended a CT scan for follow-up in 12 months which was recommended by the radiologist, but this is not been obtained due to the patient reporting she is not able to afford that at this time.   PMHx:  Past Medical History  Diagnosis Date  . PMS (premenstrual syndrome)   . Acid reflux   . Menstrual migraine   . H/O varicella   . Monilial vaginitis 2005  . Ovarian cyst, left 2012  . Herniated disc, cervical   . Neuromuscular disorder ([place])   . Anxiety   . Coronary artery dissection     a. s/p LHC 08/17/13  SCAD which likely originated in the mid LAD and propagated retrograde back to the ostium of the left main. Treated medically.   . Lung nodule     a. incidental finding on CT 08/2013. Needs follow up in 08/2014  . LV dysfunction     a. EF 35 %. LV apical hypokinesia by cath 08/17/13  . PVC's (premature ventricular contractions)   . Asthmatic bronchitis     a. Typically when  the weather changes.  . Microcytosis     a. Low ferritin 11/2013.    Past Surgical History  Procedure Laterality Date  . Wisdom tooth extraction    . Laparoscopic tubal ligation  02/20/2011    Procedure: LAPAROSCOPIC TUBAL LIGATION;  Surgeon: Alwyn Pea, MD;  Location: [facility];  Service: Gynecology;  Laterality: Bilateral;  with Removal of Mirena and Robotic Assisted Bilateral Tubal Ligation  . Tubal ligation  02/20/2011  . Robotic assisted laparoscopic ovarian cystectomy  02/2011  . Left heart catheterization with coronary angiogram Bilateral 08/17/2013    Procedure: LEFT HEART CATHETERIZATION WITH CORONARY ANGIOGRAM;  Surgeon: Jettie Booze, MD;  Location: [facility] CATH LAB;  Service: Cardiovascular;  Laterality: Bilateral;  . Combined hysteroscopy diagnostic / d&c  01/15/2015    [facility], Dr. Cletis Media  . Hysteroscopy w/ endometrial ablation  01/15/2015    [facility], Dr. Cletis Media    FAMHx:  Family History  Problem Relation Age of Onset  . Uterine cancer Mother   . Breast cancer Paternal Aunt 57  . Hypertension Father   . Diabetes Father   . Hypertension Brother   . Hypertension Brother   . Heart disease Father     SOCHx:   reports that she has never smoked. She has never used smokeless tobacco. She reports that she does not drink alcohol or use illicit drugs.  ALLERGIES:  Allergies  Allergen Reactions  . Vicodin [Hydrocodone-Acetaminophen] Nausea And Vomiting    ROS: A comprehensive review of systems was negative except for: Respiratory: positive for dyspnea on exertion Cardiovascular: positive for chest pain and palpitations Gastrointestinal: positive for bloating  HOME MEDS: Current Outpatient Prescriptions  Medication Sig Dispense Refill  . ALPRAZolam (XANAX) 0.25 MG tablet Take 1 tablet (0.25 mg total) by mouth 3 (three) times daily as needed for anxiety. 30 tablet 0  . aspirin 81 MG tablet Take 1 tablet (81 mg total) by mouth daily. 30 tablet 0  . ferrous sulfate 325  (65 FE) MG tablet Take 325 mg by mouth daily.    . Multiple Vitamin (MULTIVITAMIN) tablet Take 1 tablet by mouth daily.    . nitroGLYCERIN (NITROSTAT) 0.4 MG SL tablet Place 1 tablet (0.4 mg total) under the tongue every 5 (five) minutes x 3 doses as needed for chest pain. 25 tablet 12   No current facility-administered medications for this visit.    LABS/IMAGING: No results found for this or any previous visit (from the past 48 hour(s)). No results found.  WEIGHTS: Wt Readings from Last 3 Encounters:  05/17/15 174 lb 8 oz (79.153 kg)  05/28/14 165 lb 6.4 oz (75.025 kg)  01/22/14 167 lb (75.751 kg)    VITALS: BP 114/84 mmHg  Pulse 78  Ht 4\' 11"  (1.499 m)  Wt 174 lb 8 oz (79.153 kg)  BMI 35.23 kg/m2  LMP 05/17/2015  EXAM: Deferred  EKG: deferred  ASSESSMENT: 1. 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 Migraine headaches 4.  Anxiety  5. DOE/CP/abdominal bloating/extreme fatigue after exertion for several days  PLAN: 1.   Mrs. Cranmer has recently had an increase in episodes of chest discomfort which come on spontaneously including heaviness, a squeezing "vice grip" pain, associated palpitations and this seems to improve somewhat with nitroglycerin although it is followed by extreme fatigue and exertion that can last several days. She says these episodes seem to be more cyclical and may be associated with her menstrual cycles. She is also noted recent abdominal bloating. She tells me she recently had endometrial ablation for heavy periods with some mild improvement in her energy level. She is no longer on beta blocker due to hypotension which was noted last summer. This may been helpful for her and is possible she could be having coronary artery spasm. Possible management options include low-dose calcium channel blocker such as amlodipine 2.5 mg daily or a low-dose of long-acting  nitroglycerin such as isosorbide 15 mg daily. Both of these would have to be carefully monitored due to her low blood pressure. I explained to her that these symptoms are very unlikely to be a coronary artery dissection and the fact that her EKG is normal is reassuring, but could represent coronary spasm. She seemed quite upset at this and felt that I "thought everything was in her head", which is clearly not the case. However, I do not feel that these are symptoms of decompensated heart failure either and she denies any orthopnea, leg swelling or symptoms of congestion. We discussed further workup which could include an echocardiogram (to reassess LV function) and stress testing as well as laboratory work. Due to outstanding medical bills she does not want to pursue that testing at this time. She did ask for me to prescribe the CCB or long-acting nitrate to try for her chest discomfort, however she indicated that she did not want to follow-up with me in the future, therefore no medicines were added today. I offered to refer her to another cardiologist with whom she may feel more comfortable and she declined.  She will not be billed for today's office visit.   Pixie Casino, MD, [facility] Attending Cardiologist [place] [facility] Tahmid Stonehocker 05/17/2015, 10:50 AM

## 2015-05-23 NOTE — Addendum Note (Signed)
Addended by: Therisa Doyne on: 05/23/2015 04:33 PM   Modules accepted: Orders

## 2015-11-18 ENCOUNTER — Encounter: Payer: Self-pay | Admitting: Physician Assistant

## 2015-11-18 DIAGNOSIS — E669 Obesity, unspecified: Secondary | ICD-10-CM | POA: Insufficient documentation

## 2015-11-18 DIAGNOSIS — R0602 Shortness of breath: Secondary | ICD-10-CM | POA: Insufficient documentation

## 2015-11-18 NOTE — Progress Notes (Signed)
Cardiology Office Note    Date:  11/21/2015  ID:  [name], DOB [birth year], MRN [id number] PCP:  Wendie Agreste, MD  Cardiologist: Previously Dr. Irish Lack then patient switched to Dr. Debara Pickett. Last OV 04/2015 unclear if Dr. Debara Pickett is still her cardiologist.   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 EF was 35% by cath. She had taken Imitrex and Excedrin the day before her MI. Her dissection was treated medically. Cardiac CT was performed afterwards which was reviewed by Dr. Stanford Breed, Dr Johnsie Cancel and Dr. Meda Coffee and it was felt there was no compromise of the left main or LAD. Her dissection was not apparent. This did show a subpleural pulm nodule and cholelithiasis (f/u CT was recommended 08/2014 but the patient did not obtain this due to patient unable to afford.  F/u 2D echo 08/28/13: low normal EF 50-55%, akinesis of the apical myocardium, normal LV diastolic function parameters; f/u echo 01/2014: EF 45-50%, akinesis of apical myocardium, grade 2 DD. There have been symptoms of fatigue in the past possibly associated with heavy cycles and underwent endometrial ablation with some improvement. She has had episodic, atypical chest discomfort since that time. She was previously followed by Dr. Irish Lack but she did not feel she had a good therapeutic relationship with him so more recently had been followed by Dr. Debara Pickett. She states that interaction did not go well.  I reviewed his note - "I explained to her that these symptoms are very unlikely to be a coronary artery dissection and the fact that her EKG is normal is reassuring, but could represent coronary spasm.  She seemed quite upset at this and felt that I "thought everything was in her head", which is clearly not the case. However, I do not feel that these are symptoms of decompensated heart failure either and she denies any orthopnea, leg swelling or symptoms of congestion. We discussed further workup which could include an echocardiogram (to reassess LV function) and stress testing as well as laboratory work. Due to outstanding medical bills she does not want to pursue that testing at this time. She did ask for me to prescribe the CCB or long-acting nitrate to try for her chest discomfort, however she indicated that she did not want to follow-up with me in the future, therefore no medicines were added today. I offered to refer her to another cardiologist with whom she may feel more comfortable and she declined. She will not be billed for today's office visit."  She presents today for follow-up - reports she is continuing to have the same symptoms as described above. Ever since her MI she feels like she just doesn't have the same ability of endurance. She exercises regularly on the elliptical and while doing so, feels like she doesn't have the same stamina. She denies any acute symptoms during exercise but after exercise, for up to several days will notice SOB, fatigue, and sensation of chest congestion. She reports her BP can be lower for up to 4 hours after exercise. The other day she was sitting at her desk and began to feel SOB, disoriented, anxious and felt the need to cough. She denies any chest pain  or syncope. No LEE. Her migraines have actually improved over the last several years.     Past Medical History:  Diagnosis Date  . Acid reflux   . Anxiety   . Asthmatic bronchitis    a. Typically when the weather changes.  . Cholelithiasis   . Coronary artery dissection    a. s/p LHC 08/17/13 SCAD which likely originated in the mid LAD and propagated retrograde back to the ostium of the left main. Treated  medically.   . H/O varicella   . Herniated disc, cervical   . Hypotension    a. prev taken off BB due to BP 123XX123 systolic.  . Ischemic cardiomyopathy    a. EF 35%, LV apical hypokinesia by cath 08/17/13 - f/u echo 08/2013 EF 50-55%.  . Lung nodule    a. incidental finding on CT 08/2013. Needs follow up in 08/2014  . Menstrual migraine   . Microcytosis    a. Low ferritin 11/2013.  . Monilial vaginitis 2005  . Neuromuscular disorder ([place])   . Obesity, Class II, BMI 35-39.9   . Ovarian cyst, left 2012  . PMS (premenstrual syndrome)   . PVC's (premature ventricular contractions)     Past Surgical History:  Procedure Laterality Date  . COMBINED HYSTEROSCOPY DIAGNOSTIC / D&C  01/15/2015   [facility], Dr. Cletis Media  . HYSTEROSCOPY W/ ENDOMETRIAL ABLATION  01/15/2015   [facility], Dr. Cletis Media  . LAPAROSCOPIC TUBAL LIGATION  02/20/2011   Procedure: LAPAROSCOPIC TUBAL LIGATION;  Surgeon: Alwyn Pea, MD;  Location: [facility];  Service: Gynecology;  Laterality: Bilateral;  with Removal of Mirena and Robotic Assisted Bilateral Tubal Ligation  . LEFT HEART CATHETERIZATION WITH CORONARY ANGIOGRAM Bilateral 08/17/2013   Procedure: LEFT HEART CATHETERIZATION WITH CORONARY ANGIOGRAM;  Surgeon: Jettie Booze, MD;  Location: [facility] CATH LAB;  Service: Cardiovascular;  Laterality: Bilateral;  . ROBOTIC ASSISTED LAPAROSCOPIC OVARIAN CYSTECTOMY  02/2011  . TUBAL LIGATION  02/20/2011  . WISDOM TOOTH EXTRACTION      Current Medications: Current Outpatient Prescriptions  Medication Sig Dispense Refill  . ALPRAZolam (XANAX) 0.25 MG tablet Take 1 tablet (0.25 mg total) by mouth 3 (three) times daily as needed for anxiety. 30 tablet 0  . aspirin 81 MG tablet Take 1 tablet (81 mg total) by mouth daily. 30 tablet 0  . ferrous sulfate 325 (65 FE) MG tablet Take 325 mg by mouth daily.    . Multiple Vitamin (MULTIVITAMIN) tablet Take 1 tablet by mouth daily.    . nitroGLYCERIN (NITROSTAT) 0.4 MG SL tablet Place 1 tablet  (0.4 mg total) under the tongue every 5 (five) minutes x 3 doses as needed for chest pain. 25 tablet 12   No current facility-administered medications for this visit.      Allergies:   Vicodin [hydrocodone-acetaminophen]   Social History   Social History  . Marital status: Married    Spouse name: Ovid Curd  . Number of children: 2  . Years of education: 17+   Occupational History  . 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 Smoking status: Never Smoker  . Smokeless tobacco: Never Used  . Alcohol use No  . Drug use: No  . Sexual activity: Yes    Partners: Male    Birth control/ protection: Surgical   Other Topics Concern  . None   Social History Narrative   Lives with husband, almost 8 year old daughter and 74 year old son.  Family History:  The patient's family history includes Breast cancer (age of onset: 32) in her paternal aunt; Diabetes in her father; Heart disease in her father; Hypertension in her brother, brother, and father; Uterine cancer in her mother.   ROS:   Please see the history of present illness.  All other systems are reviewed and otherwise negative.    PHYSICAL EXAM:   VS:  BP 122/78   Pulse 84   Ht 4\' 11"  (1.499 m)   Wt 177 lb 12.8 oz (80.6 kg)   SpO2 99%   BMI 35.91 kg/m 0.0  BMI: Body mass index is 35.91 kg/m. GEN: Well nourished, well developed F, in no acute distress  HEENT: normocephalic, atraumatic Neck: no JVD, carotid bruits, or masses Cardiac: RRR; no murmurs, rubs, or gallops, no edema  Respiratory:  clear to auscultation bilaterally, normal work of breathing GI: soft, nontender, nondistended, + BS MS: no deformity or atrophy  Skin: warm and dry, no rash Neuro:  Alert and Oriented x 3, Strength and sensation are intact, follows commands Psych: euthymic mood, full affect  Wt Readings from Last 3 Encounters:  11/21/15 177 lb 12.8 oz (80.6 kg)  05/17/15 174 lb 8 oz (79.2 kg)    05/28/14 165 lb 6.4 oz (75 kg)      Studies/Labs Reviewed:   EKG:  EKG was ordered today and personally reviewed by me and demonstrates NSR 84bpm, no acute changes  Recent Labs: No results found for requested labs within last 8760 hours.   Lipid Panel    Component Value Date/Time   CHOL 173 08/18/2013 0330   TRIG 90 08/18/2013 0330   HDL 65 08/18/2013 0330   CHOLHDL 2.7 08/18/2013 0330   VLDL 18 08/18/2013 0330   LDLCALC 90 08/18/2013 0330    Additional studies/ records that were reviewed today include: Summarized above.    ASSESSMENT & PLAN:   1. SOB/Fatigue - symptoms not totally classic for angina or cardiovascular etiology. Her symptoms are primarily post-exercise. She did have an episode at rest that could represent coronary vasospasm. However, she reports her BP can be low after exercise for up to several hours so I will hold off on medication initiation pending return of labwork. I have asked her to follow her blood pressure at home and bring a log. She has had some abdominal bloating and weight gain so we could consider low dose diuretic therapy if BP allows. I suspect our management will include empiric trial and error given symptoms are somewhat atypical. Will check labs today to r/o obvious metabolic etiology including BNP, TSH, lytes, blood count. I considered repeating her cardiac CT (she has not been inclined to proceed with CT scanning due to cost) but think following up her EF at present time is more the pressing issue given the chronicity of her symptoms. Will check 2D echo to reassess LV function to help guide next steps.  2. H/o SCAD - continue aspirin. 3. Ischemic cardiomyopathy - as above. 4. Lung nodule - we discussed importance of f/u for this. She does not wish to proceed with CT scanning at present time due to cost. I instructed her to f/u PCP to further review. 5. Obesity class II - she has continued to exercise regularly and seems motivated towards a  healthy lifestyle.   Disposition: F/u with me or another APP in 4 weeks.   Medication Adjustments/Labs and Tests Ordered: Current medicines are reviewed at length with the patient today.  Concerns regarding medicines  are outlined above. Medication changes, Labs and Tests ordered today are summarized above and listed in the Patient Instructions accessible in Encounters.   Raechel Ache PA-C  11/21/2015 9:25 AM    [facility] [place], [place], [place]  [address] Phone: [phone]; Fax: [phone]

## 2015-11-21 ENCOUNTER — Encounter: Payer: Self-pay | Admitting: Physician Assistant

## 2015-11-21 ENCOUNTER — Ambulatory Visit (INDEPENDENT_AMBULATORY_CARE_PROVIDER_SITE_OTHER): Payer: Commercial Managed Care - PPO | Admitting: Physician Assistant

## 2015-11-21 VITALS — BP 122/78 | HR 84 | Ht 59.0 in | Wt 177.8 lb

## 2015-11-21 DIAGNOSIS — R0602 Shortness of breath: Secondary | ICD-10-CM | POA: Diagnosis not present

## 2015-11-21 DIAGNOSIS — R911 Solitary pulmonary nodule: Secondary | ICD-10-CM

## 2015-11-21 DIAGNOSIS — R5383 Other fatigue: Secondary | ICD-10-CM | POA: Diagnosis not present

## 2015-11-21 DIAGNOSIS — I255 Ischemic cardiomyopathy: Secondary | ICD-10-CM

## 2015-11-21 DIAGNOSIS — I2542 Coronary artery dissection: Secondary | ICD-10-CM

## 2015-11-21 DIAGNOSIS — E669 Obesity, unspecified: Secondary | ICD-10-CM

## 2015-11-21 LAB — BASIC METABOLIC PANEL
BUN: 9 mg/dL (ref 7–25)
CALCIUM: 9.5 mg/dL (ref 8.6–10.2)
CO2: 25 mmol/L (ref 20–31)
Chloride: 107 mmol/L (ref 98–110)
Creat: 0.89 mg/dL (ref 0.50–1.10)
GLUCOSE: 105 mg/dL — AB (ref 65–99)
POTASSIUM: 4.5 mmol/L (ref 3.5–5.3)
Sodium: 138 mmol/L (ref 135–146)

## 2015-11-21 LAB — CBC WITH DIFFERENTIAL/PLATELET
BASOS ABS: 58 {cells}/uL (ref 0–200)
BASOS PCT: 1 %
Eosinophils Absolute: 174 cells/uL (ref 15–500)
Eosinophils Relative: 3 %
HCT: 40.2 % (ref 35.0–45.0)
Hemoglobin: 14 g/dL (ref 11.7–15.5)
LYMPHS ABS: 2030 {cells}/uL (ref 850–3900)
LYMPHS PCT: 35 %
MCH: 26.1 pg — ABNORMAL LOW (ref 27.0–33.0)
MCHC: 34.8 g/dL (ref 32.0–36.0)
MCV: 75 fL — AB (ref 80.0–100.0)
MONO ABS: 406 {cells}/uL (ref 200–950)
MPV: 9.4 fL (ref 7.5–12.5)
Monocytes Relative: 7 %
NEUTROS PCT: 54 %
Neutro Abs: 3132 cells/uL (ref 1500–7800)
PLATELETS: 266 10*3/uL (ref 140–400)
RBC: 5.36 MIL/uL — AB (ref 3.80–5.10)
RDW: 13.7 % (ref 11.0–15.0)
WBC: 5.8 10*3/uL (ref 3.8–10.8)

## 2015-11-21 LAB — TSH: TSH: 1.11 m[IU]/L

## 2015-11-21 NOTE — Patient Instructions (Signed)
Medication Instructions:  Your physician recommends that you continue on your current medications as directed. Please refer to the Current Medication list given to you today.   Labwork: Your physician recommends that you have lab work today: bmet/cbc/tsh/bnp   Testing/Procedures: Your physician has requested that you have an echocardiogram. Echocardiography is a painless test that uses sound waves to create images of your heart. It provides your doctor with information about the size and shape of your heart and how well your heart's chambers and valves are working. This procedure takes approximately one hour. There are no restrictions for this procedure.    Follow-Up: Your physician recommends that you keep your scheduled  follow-up appointment with Melina Copa, PA on Friday, November 3 @ 9:00 am   Any Other Special Instructions Will Be Listed Below (If Applicable).     If you need a refill on your cardiac medications before your next appointment, please call your pharmacy.

## 2015-11-22 LAB — BRAIN NATRIURETIC PEPTIDE: Brain Natriuretic Peptide: 7 pg/mL (ref <100)

## 2015-11-25 ENCOUNTER — Telehealth: Payer: Self-pay | Admitting: *Deleted

## 2015-11-25 ENCOUNTER — Telehealth: Payer: Self-pay | Admitting: Physician Assistant

## 2015-11-25 MED ORDER — AMLODIPINE BESYLATE 2.5 MG PO TABS: 2.5000 mg | ORAL_TABLET | Freq: Every day | ORAL | 3 refills | Status: DC

## 2015-11-25 NOTE — Telephone Encounter (Signed)
-----   Message from Charlie Pitter, PA-C sent at 11/25/2015  8:05 AM EDT ----- BNP is totally normal so abd bloating and weight gain is unlikely to be related to congestive heart failure which is reassuring. Can try amlodipine 2.5mg  daily to see if it helps with her SOB/endurance. Make sure to follow BP closely and call if running too low. Also can you clarify if she plans to f/u with Dr. Debara Pickett long term? Otherwise we should consider having her follow up with Dr. Oval Linsey to establish care so that she has a cardiologist following her. Dayna Dunn PA-C

## 2015-11-25 NOTE — Telephone Encounter (Signed)
Pt aware of her lab results.  She is agreeable with starting Amlodipine 2.5 mg qd and to call the office if her bp runs too low. Pt wold like to wait until she sees you 12/20/15 and talk about who she will see for Cardiology f/u. Rx called into pharmacy.

## 2015-11-25 NOTE — Telephone Encounter (Signed)
New message      Pt c/o medication issue:  1. Name of Medication: amlodinine 2. How are you currently taking this medication (dosage and times per day)? 2.5mg  3. Are you having a reaction (difficulty breathing--STAT)? no 4. What is your medication issue? Prescription was to have been called in to [facility] at [address] not [facility] script.

## 2015-11-25 NOTE — Addendum Note (Signed)
Addended by: Gaetano Net on: 11/25/2015 03:18 PM   Modules accepted: Orders

## 2015-11-25 NOTE — Telephone Encounter (Signed)
Returned pts call to let her know that Amlodipine has been called into [facility].  Left detailed message on pts voicemail.

## 2015-12-05 ENCOUNTER — Other Ambulatory Visit (HOSPITAL_COMMUNITY): Payer: Commercial Managed Care - PPO

## 2015-12-19 NOTE — Progress Notes (Addendum)
Cardiology Office Note    Date:  12/20/2015  ID:  [name], DOB [birth year], MRN [id number] PCP:  Wendie Agreste, MD  Cardiologist:  Previously Dr. Irish Lack then Dr. Debara Pickett   Chief Complaint: f/u SOB  History of Present Illness:  [name] is a 45 y.o. 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 EF was 35% by cath. She had taken Imitrex and Excedrin the day before her MI. Her dissection was treated medically. Cardiac CT was performed afterwards which was reviewed by Dr. Stanford Breed, Dr Johnsie Cancel and Dr. Meda Coffee and it was felt there was no compromise of the left main or LAD. Her dissection was not apparent. This did show a subpleural pulmonary nodule and cholelithiasis (f/u CT was recommended 08/2014 but the patient did not obtain this due to cost. F/u 2D echo 08/28/13: low normal EF 50-55%, akinesis of the apical myocardium, normal LV diastolic function parameters; f/u echo 01/2014: EF 45-50%, akinesis of apical myocardium, grade 2 DD. There have been symptoms of fatigue in the past possibly associated with heavy cycles. She underwent endometrial ablation with some improvement. She has had episodic atypical chest discomfort since that time. She was previously followed by Dr. Irish Lack but she did not feel she had a good therapeutic relationship with him so more recently had been followed by Dr. Debara Pickett. She states that interaction did not go well. His last note 04/2015 indicated "I explained to her that these symptoms are very unlikely to be a coronary artery dissection and the fact that her EKG is normal is reassuring, but could represent coronary spasm. She seemed quite upset at this and felt that I "thought everything  was in her head", which is clearly not the case. However, I do not feel that these are symptoms of decompensated heart failure either and she denies any orthopnea, leg swelling or symptoms of congestion. We discussed further workup which could include an echocardiogram (to reassess LV function) and stress testing as well as laboratory work. Due to outstanding medical bills she does not want to pursue that testing at this time. She did ask for me to prescribe the CCB or long-acting nitrate to try for her chest discomfort, however she indicated that she did not want to follow-up with me in the future, therefore no medicines were added today. I offered to refer her to another cardiologist with whom she may feel more comfortable and she declined. She will not be billed for today's office visit."  She presented 11/21/15 for follow-up continuing to have the same symptoms as described above. Ever since her MI she has felt like she just doesn't have the same stamina. She denied any acute symptoms during exercise, but after exercise would notice SOB, fatigue, and sensation of chest congestion for several days. She also reported some abdominal bloating and weight gain. She reported her BP could be lower for up to 4 hours after exercise. She was not describing chest pain. Symptoms were not totally classic for angina or cardiovascular etiology. Labs 11/21/15 argued against CHF with normal BNP; otherwise normal TSH, Hgb 14.0, BUN 9, Cr 0.89, K 4.5. She was referred for 2D echo but did not attend this appointment due to cost. She was started on empiric dosing  of amlodipine 2.5mg  in case there was a component of spasm as previously suggested.  She returns for follow-up today stating she is feeling much better. She has felt improvement in the above symptoms with amlodipine. She has noticed some sleepiness with this so started taking it every other day earlier this week. She is exercising without difficulty. She does recall one  instance about 2 weeks ago where she jumped out of bed quickly in the middle of the night and nearly passed out - could hear her husband but felt somewhat out of it. She checked her BP and it was in the 90s/60s. This has not recurred. She has not had any recent sustained palpitations.  Past Medical History:  Diagnosis Date  . Acid reflux   . Anxiety   . Asthmatic bronchitis    a. Typically when the weather changes.  . Cholelithiasis   . Coronary artery dissection    a. s/p LHC 08/17/13 SCAD which likely originated in the mid LAD and propagated retrograde back to the ostium of the left main. Treated medically.   . H/O varicella   . Herniated disc, cervical   . Hypotension    a. prev taken off BB due to BP 123XX123 systolic.  . Ischemic cardiomyopathy    a. EF 35%, LV apical hypokinesia by cath 08/17/13 - f/u echo 08/2013 EF 50-55%.  . Lung nodule    a. incidental finding on CT 08/2013. Needs follow up in 08/2014  . Menstrual migraine   . Microcytosis    a. Low ferritin 11/2013.  . Monilial vaginitis 2005  . Neuromuscular disorder ([place])   . Obesity, Class II, BMI 35-39.9   . Ovarian cyst, left 2012  . PMS (premenstrual syndrome)   . PVC's (premature ventricular contractions)     Past Surgical History:  Procedure Laterality Date  . COMBINED HYSTEROSCOPY DIAGNOSTIC / D&C  01/15/2015   [facility]GY[facility], Dr. Cletis Media  . HYSTEROSCOPY W/ ENDOMETRIAL ABLATION  01/15/2015   CCOBGYN, Dr. Cletis Media  . LAPAROSCOPIC TUBAL LIGATION  02/20/2011   Procedure: LAPAROSCOPIC TUBAL LIGATION;  Surgeon: Alwyn Pea, MD;  Location: [facility];  Service: Gynecology;  Laterality: Bilateral;  with Removal of Mirena and Robotic Assisted Bilateral Tubal Ligation  . LEFT HEART CATHETERIZATION WITH CORONARY ANGIOGRAM Bilateral 08/17/2013   Procedure: LEFT HEART CATHETERIZATION WITH CORONARY ANGIOGRAM;  Surgeon: Jettie Booze, MD;  Location: [facility] C[facility] LAB;  Service: Cardiovascular;  Laterality: Bilateral;  . ROBOTIC ASSISTED  LAPAROSCOPIC OVARIAN CYSTECTOMY  02/2011  . TUBAL LIGATION  02/20/2011  . WISDOM TOOTH EXTRACTION      Current Medications: Current Outpatient Prescriptions  Medication Sig Dispense Refill  . ALPRAZolam (XANAX) 0.25 MG tablet Take 1 tablet (0.25 mg total) by mouth 3 (three) times daily as needed for anxiety. 30 tablet 0  . amLODipine (NORVASC) 2.5 MG tablet Take 1 tablet (2.5 mg total) by mouth daily. 90 tablet 3  . aspirin 81 MG tablet Take 1 tablet (81 mg total) by mouth daily. 30 tablet 0  . Multiple Vitamin (MULTIVITAMIN) tablet Take 1 tablet by mouth daily.    . nitroGLYCERIN (NITROSTAT) 0.4 MG SL tablet Place 1 tablet (0.4 mg total) under the tongue every 5 (five) minutes x 3 doses as needed for chest pain. 25 tablet 12   No current facility-administered medications for this visit.      Allergies:   Vicodin [hydrocodone-acetaminophen]   Social History   Social History  . Marital status: Married    Spouse name:  Ovid Curd  . Number of children: 2  . Years of education: 17+   Occupational History  . 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 Smoking status: Never Smoker  . Smokeless tobacco: Never Used  . Alcohol use No  . Drug use: No  . Sexual activity: Yes    Partners: Male    Birth control/ protection: Surgical   Other Topics Concern  . None   Social History Narrative   Lives with husband, almost 30 year old daughter and 81 year old son.     Family History:  The patient's family history includes Breast cancer (age of onset: 21) in her paternal aunt; Diabetes in her father; Heart disease in her father; Hypertension in her brother, brother, and father; Uterine cancer in her mother.   ROS:   Please see the history of present illness.  All other systems are reviewed and otherwise negative.    PHYSICAL EXAM:   VS:  BP 108/84   Pulse 78   Ht 4\' 11"  (1.499 m)   Wt 177 lb 12.8 oz (80.6 kg)   BMI 35.91 kg/m   BMI: Body  mass index is 35.91 kg/m. GEN: Well nourished, well developed obese F, in no acute distress  HEENT: normocephalic, atraumatic Neck: no JVD, carotid bruits, or masses Cardiac: RRR; no murmurs, rubs, or gallops, no edema  Respiratory:  clear to auscultation bilaterally, normal work of breathing GI: soft, nontender, nondistended, + BS MS: no deformity or atrophy  Skin: warm and dry, no rash Neuro:  Alert and Oriented x 3, Strength and sensation are intact, follows commands Psych: euthymic mood, full affect  Wt Readings from Last 3 Encounters:  12/20/15 177 lb 12.8 oz (80.6 kg)  11/21/15 177 lb 12.8 oz (80.6 kg)  05/17/15 174 lb 8 oz (79.2 kg)      Studies/Labs Reviewed:   EKG:  EKG was ordered today and personally reviewed by me and demonstrates NSR 78bpm, no acute ST-T changes.  Recent Labs: 11/21/2015: Brain Natriuretic Peptide 7.0; BUN 9; Creat 0.89; Hemoglobin 14.0; Platelets 266; Potassium 4.5; Sodium 138; TSH 1.11   Lipid Panel    Component Value Date/Time   CHOL 173 08/18/2013 0330   TRIG 90 08/18/2013 0330   HDL 65 08/18/2013 0330   CHOLHDL 2.7 08/18/2013 0330   VLDL 18 08/18/2013 0330   LDLCALC 90 08/18/2013 0330    Additional studies/ records that were reviewed today include: Summarized above.   ASSESSMENT & PLAN:   1. Dyspnea/fatigue - improved on current regimen. Continue amlodipine as tolerated. She has cut this back to every other day. Will hold off on echo for now as she is clinically improved. 2. Coronary artery dissection - continue current regimen. She requests refill of NTG. She has not had to use this but wants to have on hand if needed. We reviewed that she should not take this if her BP is running low. 3. Ischemic cardiomyopathy - appears euvolemic. Recent BNP wnl. 4. Pulmonary nodule - she states her insurance will finally change in January, allowing her to obtain necessary medical testing. Reinforced importance of f/u of this. Will refer to [facility] to consider low dose CT scanning f/u. 5. Obesity class II - continue regular physical activity. 6. Pre-syncope - this sounds most likely related to jumping up too fast out of bed, perpetuating a component of orthostasis. I emphasized orthostatic precautions and importance of staying hydrated. I advised her  to monitor for recurrent symptoms which would prompt the need for further workup.  Disposition: F/u with Dr. Oval Linsey in 3 months. I think she would be a good fit for a female physician. She does not wish to follow up with Dr. Irish Lack or Dr. Debara Pickett. I will send a message to them requesting authorization for the switch.   Medication Adjustments/Labs and Tests Ordered: Current medicines are reviewed at length with the patient today.  Concerns regarding medicines are outlined above. Medication changes, Labs and Tests ordered today are summarized above and listed in the Patient Instructions accessible in Encounters.   Raechel Ache PA-C  12/20/2015 9:24 AM    [facility], [place], [place]  [address] Phone: [phone]; Fax: [phone]

## 2015-12-20 ENCOUNTER — Ambulatory Visit (INDEPENDENT_AMBULATORY_CARE_PROVIDER_SITE_OTHER): Payer: Commercial Managed Care - PPO | Admitting: Physician Assistant

## 2015-12-20 ENCOUNTER — Encounter: Payer: Self-pay | Admitting: Physician Assistant

## 2015-12-20 VITALS — BP 108/84 | HR 78 | Ht 59.0 in | Wt 177.8 lb

## 2015-12-20 DIAGNOSIS — I2542 Coronary artery dissection: Secondary | ICD-10-CM

## 2015-12-20 DIAGNOSIS — R0600 Dyspnea, unspecified: Secondary | ICD-10-CM | POA: Diagnosis not present

## 2015-12-20 DIAGNOSIS — I255 Ischemic cardiomyopathy: Secondary | ICD-10-CM | POA: Diagnosis not present

## 2015-12-20 DIAGNOSIS — R55 Syncope and collapse: Secondary | ICD-10-CM

## 2015-12-20 DIAGNOSIS — R5383 Other fatigue: Secondary | ICD-10-CM | POA: Diagnosis not present

## 2015-12-20 DIAGNOSIS — R911 Solitary pulmonary nodule: Secondary | ICD-10-CM

## 2015-12-20 DIAGNOSIS — E669 Obesity, unspecified: Secondary | ICD-10-CM

## 2015-12-20 DIAGNOSIS — E66812 Obesity, class 2: Secondary | ICD-10-CM

## 2015-12-20 MED ORDER — NITROGLYCERIN 0.4 MG SL SUBL: 0.4000 mg | SUBLINGUAL_TABLET | SUBLINGUAL | 12 refills | Status: DC | PRN

## 2015-12-20 NOTE — Patient Instructions (Addendum)
Medication Instructions:  Your physician recommends that you continue on your current medications as directed. Please refer to the Current Medication list given to you today.   Labwork: None ordered  Testing/Procedures: None ordered   Follow-Up: Your physician recommends that you schedule a follow-up appointment in: [place]   Any Other Special Instructions Will Be Listed Below (If Applicable). You have been referred to [facility] TO BEGIN 02/2016.  SOMEONE WILL CALL YOU WITH AN APPT.     If you need a refill on your cardiac medications before your next appointment, please call your pharmacy.

## 2015-12-24 ENCOUNTER — Telehealth: Payer: Self-pay | Admitting: Physician Assistant

## 2015-12-24 NOTE — Telephone Encounter (Signed)
Called pt to inform her, per Melina Copa, PA-C, that she has been approved to switch cardiologist to Dr. Oval Linsey.  Pt is aware that I will send Rip Harbour, RN for DR. Oval Linsey a message for her to contact pt to get her 3 mo f/u appt.  Pt also was advised that she doesn't meet the criteria for pulm nodule clinic, that it would just need to be f/u'd with a CT which can be done from PCP if pt doesn't want to see Pulm.  Pt agreeable with this plan and appreciated the f/u call.

## 2015-12-24 NOTE — Telephone Encounter (Signed)
Please let the patient know she has been approved to switch cardiology providers to Dr. Oval Linsey. Will need f/u arranged as reviewed in note.  Also, please let her know I reviewed her CT scan/pulmonary nodule with the woman who runs the pulm clinic that follows people with pulmonary nodules. Right now this clinic only includes people who are at risk for lung cancer with a very specific criteria that she does not presently meet (age-wise and smoking history). So she doesn't fall into that specific category but has 2 other options for follow-up. She can either discuss with her PCP to order the follow-up CT scan, or she can keep f/u with pulmonology as planned - either option is fine, but if she doesn't specifically want to see pulmonology unless she has to, she can start with getting the CT first through her PCP (then refer to pulm if needed). Thanks.  Dayna Dunn PA-C

## 2015-12-25 NOTE — Telephone Encounter (Signed)
Left message to call back  Patient will need 30 minute new patient appointment since she has not seen Dr Oval Linsey

## 2015-12-30 NOTE — Telephone Encounter (Signed)
Patient scheduled for ov 2/18

## 2016-03-03 ENCOUNTER — Institutional Professional Consult (permissible substitution): Payer: Commercial Managed Care - PPO | Admitting: Pulmonary Disease

## 2016-03-20 ENCOUNTER — Encounter (HOSPITAL_COMMUNITY): Payer: Self-pay | Admitting: Radiology

## 2016-03-20 NOTE — Progress Notes (Signed)
Patient cancelled echocardiogram due to inability to pay.

## 2016-03-26 ENCOUNTER — Ambulatory Visit: Payer: Commercial Managed Care - PPO | Admitting: Cardiovascular Disease

## 2016-04-17 ENCOUNTER — Encounter: Payer: Self-pay | Admitting: Cardiovascular Disease

## 2016-04-17 ENCOUNTER — Ambulatory Visit (INDEPENDENT_AMBULATORY_CARE_PROVIDER_SITE_OTHER): Payer: Commercial Managed Care - PPO | Admitting: Cardiovascular Disease

## 2016-04-17 VITALS — BP 99/58 | HR 81 | Ht 59.0 in | Wt 174.0 lb

## 2016-04-17 DIAGNOSIS — I2542 Coronary artery dissection: Secondary | ICD-10-CM

## 2016-04-17 DIAGNOSIS — Z13220 Encounter for screening for lipoid disorders: Secondary | ICD-10-CM

## 2016-04-17 NOTE — Progress Notes (Signed)
Cardiology Office Note   Date:  04/17/2016   ID:  [name], DOB 1970/07/03, MRN [id number]  PCP:  Alexis Agreste, MD  Cardiologist:   Alexis Latch, MD   Chief Complaint  Patient presents with  . New Evaluation    previous pt of Dr. Debara Pickett here to establish care       History of Present Illness: [name] is a 46 y.o. female with SCAD status post myocardial infarction who presents for follow up.  She had presumed SCAD of the LAD in 2015.  This was managed medically. LVEF was 35% by cath. She had a cardiac CT scan that subsequently did not show any dissection of the left main or left anterior descending. Noted subpleural pulmonary nodules and cholelithiasis. Coronary calcium score was 0. A follow-up CT scan was recommended but this was not obtained due to cost. She had a subsequent echocardiogram 08/2013 that revealed LVEF 50-55% with akinesis of the apical myocardium and normal diastolic function. Follow-up echo 01/2014 revealed LVEF 45-50% with apical akinesis, grade 2 diastolic dysfunction.  She was previously a patient of Dr. Irish Lack but switched to Dr. Debara Pickett in 2016.  She last saw Dr. Debara Pickett on 05/17/15 and reported chest pain and palpitations that occurred randomly.  She felt that they might be related to her menstrual cycles. Her EKG was unremarkable at that time. She has been treated with aspirin and metoprolol but developed dizziness with metoprolol. It was felt that the symptoms were unlikely to be related to recurrent coronary artery dissection and either calcium channel blocker or long-acting nitrates were recommended due to possible coronary spasm. She elected to switch providers again. She followed up with Melina Copa October and November 2017 with a complaint of shortness of breath. A repeat echocardiogram was ordered but has not been performed. She was started on amlodipine 2.5mg  with improvement in her symptoms.   Since that appointment she has been feeling much  better. She no longer has shortness of breath. She continues to have symptoms fatigue with extreme exercise. She likes to exercise several times per week. After 30 minutes on the elliptical she feels very tired and attributes this to her heart disease. She no longer notes lower extremity edema or abdominal distention. She feels a cold weather is often a trigger for her symptoms as well.  She checks her blood pressure at home and it typically is around 110 over 70s.  Since her heart attack [name] has noted episodes of mental confusion. It is sometimes hard for her to recall names. His happens approximately once per week.   Past Medical History:  Diagnosis Date  . Acid reflux   . Anxiety   . Asthmatic bronchitis    a. Typically when the weather changes.  . Cholelithiasis   . Coronary artery dissection    a. s/p LHC 08/17/13 SCAD which likely originated in the mid LAD and propagated retrograde back to the ostium of the left main. Treated medically.   . H/O varicella   . Herniated disc, cervical   . Hypotension    a. prev taken off BB due to BP [id number] systolic.  . Ischemic cardiomyopathy    a. EF 35%, LV apical hypokinesia by cath 08/17/13 - f/u echo 08/2013 EF 50-55%.  . Lung nodule    a. incidental finding on CT 08/2013. Needs follow up in 08/2014  . Menstrual migraine   . Microcytosis    a. Low ferritin 11/2013.  . Monilial vaginitis 2005  .  Neuromuscular disorder ([place])   . Obesity, Class II, BMI 35-39.9   . Ovarian cyst, left 2012  . PMS (premenstrual syndrome)   . PVC's (premature ventricular contractions)     Past Surgical History:  Procedure Laterality Date  . COMBINED HYSTEROSCOPY DIAGNOSTIC / D&C  01/15/2015   [facility], Dr. Cletis Media  . HYSTEROSCOPY W/ ENDOMETRIAL ABLATION  01/15/2015   [facility]GY[facility], Dr. Cletis Media  . LAPAROSCOPIC TUBAL LIGATION  02/20/2011   Procedure: LAPAROSCOPIC TUBAL LIGATION;  Surgeon: Alwyn Pea, MD;  Location: [facility];  Service: Gynecology;  Laterality:  Bilateral;  with Removal of Mirena and Robotic Assisted Bilateral Tubal Ligation  . LEFT HEART CATHETERIZATION WITH CORONARY ANGIOGRAM Bilateral 08/17/2013   Procedure: LEFT HEART CATHETERIZATION WITH CORONARY ANGIOGRAM;  Surgeon: Jettie Booze, MD;  Location[facility];  Service: Cardiovascular;  Laterality: Bilateral;  . ROBOTIC ASSISTED LAPAROSCOPIC OVARIAN CYSTECTOMY  02/2011  . TUBAL LIGATION  02/20/2011  . WISDOM TOOTH EXTRACTION       Current Outpatient Prescriptions  Medication Sig Dispense Refill  . aspirin 81 MG tablet Take 1 tablet (81 mg total) by mouth daily. 30 tablet 0  . Multiple Vitamin (MULTIVITAMIN) tablet Take 1 tablet by mouth daily.    . nitroGLYCERIN (NITROSTAT) 0.4 MG SL tablet Place 1 tablet (0.4 mg total) under the tongue every 5 (five) minutes x 3 doses as needed for chest pain. 25 tablet 12  . amLODipine (NORVASC) 2.5 MG tablet Take 1 tablet (2.5 mg total) by mouth daily. (Patient taking differently: Take 2.5 mg by mouth every other day. ) 90 tablet 3   No current facility-administered medications for this visit.     Allergies:   Vicodin [hydrocodone-acetaminophen]    Social History:  The patient  reports that she has never smoked. She has never used smokeless tobacco. She reports that she does not drink alcohol or use drugs.   Family History:  The patient's family history includes Breast cancer (age of onset: 57) in her paternal aunt; Diabetes in her father; Heart disease in her father and paternal grandmother; Hypertension in her brother, brother, and father; Uterine cancer in her mother.    ROS:  Please see the history of present illness.   Otherwise, review of systems are positive for none.   All other systems are reviewed and negative.    PHYSICAL EXAM: VS:  BP (!) 99/58 (BP Location: Right Arm, Patient Position: Sitting, Cuff Size: Large)   Pulse 81   Ht 4\' 11"  (1.499 m)   Wt 78.9 kg (174 lb)   BMI 35.14 kg/m  , BMI Body mass index is 35.14  kg/m.   L 110/70 R 108/72   GENERAL:  Well appearing HEENT:  Pupils equal round and reactive, fundi not visualized, oral mucosa unremarkable NECK:  No jugular venous distention, waveform within normal limits, carotid upstroke brisk and symmetric, no bruits, no thyromegaly LYMPHATICS:  No cervical adenopathy LUNGS:  Clear to auscultation bilaterally HEART:  RRR.  PMI not displaced or sustained,S1 and S2 within normal limits, no S3, no S4, no clicks, no rubs, no murmurs ABD:  Flat, positive bowel sounds normal in frequency in pitch, no bruits, no rebound, no guarding, no midline pulsatile mass, no hepatomegaly, no splenomegaly EXT:  2 plus pulses throughout, no edema, no cyanosis no clubbing SKIN:  No rashes no nodules NEURO:  Cranial nerves II through XII grossly intact, motor grossly intact throughout PSYCH:  Cognitively intact, oriented to person place and time  EKG:  EKG is not ordered today.  Recent Labs: 11/21/2015: Brain Natriuretic Peptide 7.0; BUN 9; Creat 0.89; Hemoglobin 14.0; Platelets 266; Potassium 4.5; Sodium 138; TSH 1.11    Lipid Panel    Component Value Date/Time   CHOL 173 08/18/2013 0330   TRIG 90 08/18/2013 0330   HDL 65 08/18/2013 0330   CHOLHDL 2.7 08/18/2013 0330   VLDL 18 08/18/2013 0330   LDLCALC 90 08/18/2013 0330      Wt Readings from Last 3 Encounters:  04/17/16 78.9 kg (174 lb)  12/20/15 80.6 kg (177 lb 12.8 oz)  11/21/15 80.6 kg (177 lb 12.8 oz)      ASSESSMENT AND PLAN:  # SCAD: Ms. Rowley had SCAD of the LAD in 2015.  Follow-up CT scan showed no evidence of coronary dissection and normal coronary flow. She seems to be doing better lately. She has known apical akinesis.  Continue aspirin. It is unclear whether she may also have coronary spasms. She seems to be doing better on amlodipine 2.5 mg every other day. We will not make any changes at this time.  # CV Disease Prevention:  Continue regular exercise. We will check fasting  lipids.   Current medicines are reviewed at length with the patient today.  The patient does not have concerns regarding medicines.  The following changes have been made:  no change  Labs/ tests ordered today include:   Orders Placed This Encounter  Procedures  . Lipid panel     Disposition:   FU with Owens Hara C. Oval Linsey, MD, [facility] in 6 months.     This note was written with the assistance of speech recognition software.  Please excuse any transcriptional errors.  Signed, Chandan Fly C. Oval Linsey, MD, [facility]  04/17/2016 1:42 PM    [place]

## 2016-04-17 NOTE — Patient Instructions (Addendum)
Medication Instructions:  Your physician recommends that you continue on your current medications as directed. Please refer to the Current Medication list given to you today.  Labwork: FASTING LIPID PANEL SOON [facility] AT [place] [address] 04/22/16 AFTER 7:30  Testing/Procedures: NONE  Follow-Up: Your physician wants you to follow-up in: [place] will receive a reminder letter in the mail two months in advance. If you don't receive a letter, please call our office to schedule the follow-up appointment.  If you need a refill on your cardiac medications before your next appointment, please call your pharmacy.

## 2016-04-22 ENCOUNTER — Other Ambulatory Visit: Payer: Commercial Managed Care - PPO

## 2016-07-08 ENCOUNTER — Other Ambulatory Visit: Payer: Commercial Managed Care - PPO | Admitting: *Deleted

## 2016-07-08 DIAGNOSIS — Z13220 Encounter for screening for lipoid disorders: Secondary | ICD-10-CM

## 2016-07-08 LAB — LIPID PANEL
Chol/HDL Ratio: 3.2 ratio (ref 0.0–4.4)
Cholesterol, Total: 197 mg/dL (ref 100–199)
HDL: 62 mg/dL
LDL Calculated: 117 mg/dL — ABNORMAL HIGH (ref 0–99)
Triglycerides: 89 mg/dL (ref 0–149)
VLDL Cholesterol Cal: 18 mg/dL (ref 5–40)

## 2016-07-14 ENCOUNTER — Telehealth: Payer: Self-pay | Admitting: Cardiovascular Disease

## 2016-07-14 NOTE — Telephone Encounter (Signed)
Pt of Dr. Terese Door to patient who identifies following concerns: Sob, fatigue, weakness, swelling in abdomen at night, occasional cough  Notes she is taking amlodipine - used to take EOD - now taking every day "not enough to control symptoms"  Identifies more stress in last 3 weeks, would exercise for relief but this would make her symptoms worse. "from the very beginning i had an echocardiogram, dx was grade 2 dd" - she wanted to know if symptoms correlated to this. Pt would like to know if any other meds to recommend? pt states she is missing work due to symptoms. She usually has a day where she will "crash" and just feel lousy for the next few days. She notes she worked out last Wednesday, and has missed a couple of days of work in the last week due to this fatigue.  Pt concerned that her symptoms were "blown off" by other doctors. She saw Dr. Oval Linsey in March for same complaint. Notes by the time she sees Dr. Oval Linsey she's usually feeling better.  Reports no lightheadedness, dizziness, CP, actively high BPs, etc. (BP was 105/78 yesterday, typical for her)  I did offer appt, which she declined for now. States her symptoms are "same problem I was having when I saw her in March". She was recommended a 6 month return at that time, and would like to keep that follow up time frame. Pt aware I'll route to Dr. Oval Linsey for any recommendations.

## 2016-07-14 NOTE — Telephone Encounter (Signed)
Cannot assess without seeing the patient.

## 2016-07-14 NOTE — Telephone Encounter (Signed)
Patient calling states that she does not believe medication is working and thinks that she needs more medication. Please call, thanks.

## 2016-07-15 NOTE — Telephone Encounter (Signed)
Follow up  Pt voiced still awaiting Nathan's call.  Please f/u

## 2016-07-15 NOTE — Telephone Encounter (Signed)
Scheduled patient for 07/17/16 with Dr Oval Linsey, she preferred seeing her

## 2016-07-17 ENCOUNTER — Ambulatory Visit (INDEPENDENT_AMBULATORY_CARE_PROVIDER_SITE_OTHER): Payer: Commercial Managed Care - PPO | Admitting: Cardiovascular Disease

## 2016-07-17 ENCOUNTER — Encounter: Payer: Self-pay | Admitting: Cardiovascular Disease

## 2016-07-17 VITALS — BP 110/80 | HR 84 | Ht 59.0 in | Wt 172.0 lb

## 2016-07-17 DIAGNOSIS — I2542 Coronary artery dissection: Secondary | ICD-10-CM | POA: Diagnosis not present

## 2016-07-17 DIAGNOSIS — I5043 Acute on chronic combined systolic (congestive) and diastolic (congestive) heart failure: Secondary | ICD-10-CM | POA: Diagnosis not present

## 2016-07-17 DIAGNOSIS — R0602 Shortness of breath: Secondary | ICD-10-CM | POA: Diagnosis not present

## 2016-07-17 DIAGNOSIS — R079 Chest pain, unspecified: Secondary | ICD-10-CM | POA: Diagnosis not present

## 2016-07-17 MED ORDER — ISOSORBIDE MONONITRATE ER 30 MG PO TB24: 30.0000 mg | ORAL_TABLET | Freq: Every day | ORAL | 5 refills | Status: DC

## 2016-07-17 MED ORDER — FUROSEMIDE 20 MG PO TABS: ORAL_TABLET | ORAL | 2 refills | Status: DC

## 2016-07-17 NOTE — Progress Notes (Signed)
Cardiology Office Note   Date:  07/18/2016   ID:  [name], DOB [birth year], MRN [id number]  PCP:  Wendie Agreste, MD  Cardiologist:   Skeet Latch, MD   Chief Complaint  Patient presents with  . Follow-up    sob and chedst pain ha taken nitro this week      History of Present Illness: [name] is a 46 y.o. female with SCAD status post myocardial infarction who presents for follow up.  She had presumed SCAD of the LAD in 2015.  This was managed medically. LVEF was 35% by cath. She had a cardiac CT scan that subsequently did not show any dissection of the left main or left anterior descending. It noted subpleural pulmonary nodules and cholelithiasis. Coronary calcium score was 0.  A follow-up CT scan was recommended but this was not obtained due to cost. She had a subsequent echocardiogram 08/2013 that revealed LVEF 50-55% with akinesis of the apical myocardium and normal diastolic function. Follow-up echo 01/2014 revealed LVEF 45-50% with apical akinesis, grade 2 diastolic dysfunction.  She was previously a patient of Dr. Irish Lack but switched to Dr. Debara Pickett in 2016.  She last saw Dr. Debara Pickett on 05/17/15 and reported chest pain and palpitations that occurred randomly.  She felt that they might be related to her menstrual cycles. Her EKG was unremarkable at that time. She has been treated with aspirin and metoprolol but developed dizziness with metoprolol. It was felt that the symptoms were unlikely to be related to recurrent coronary artery dissection and either calcium channel blocker or long-acting nitrates were recommended due to possible coronary spasm. She elected to switch providers again. She followed up with Melina Copa October and November 2017 with a complaint of shortness of breath. A repeat echocardiogram was ordered but has not been performed. She was started on amlodipine 2.5 mg with improvement in her symptoms.   For the last three weeks [name] has been feeling  short of breath and fatigued.  She notes fluid accumulation in her abdomen and feet.  She has also been waking up in the middle of the night coughing and short of breath.  She feels weak and dizzy when she stands up.  Last week she was starting to feel better and tried to work out.  She felt well that day but the following two days she was unable to work because she was so tired and short of breath.  She also endorses lightheadedness and dizziness.  Her BP at home has been 107/78, HR 83.  She also endorses chest pressure and shortness of breath.  She is afraid that these symptoms are similar to when she had a heart attack and has been taking nitroglycerin as needed, which helps her symptoms.    Past Medical History:  Diagnosis Date  . Acid reflux   . Anxiety   . Asthmatic bronchitis    a. Typically when the weather changes.  . Cholelithiasis   . Coronary artery dissection    a. s/p LHC 08/17/13 SCAD which likely originated in the mid LAD and propagated retrograde back to the ostium of the left main. Treated medically.   . H/O varicella   . Herniated disc, cervical   . Hypotension    a. prev taken off BB due to BP 25K-27C systolic.  . Ischemic cardiomyopathy    a. EF 35%, LV apical hypokinesia by cath 08/17/13 - f/u echo 08/2013 EF 50-55%.  . Lung nodule  a. incidental finding on CT 08/2013. Needs follow up in 08/2014  . Menstrual migraine   . Microcytosis    a. Low ferritin 11/2013.  . Monilial vaginitis 2005  . Neuromuscular disorder ([place])   . Obesity, Class II, BMI 35-39.9   . Ovarian cyst, left 2012  . PMS (premenstrual syndrome)   . PVC's (premature ventricular contractions)     Past Surgical History:  Procedure Laterality Date  . COMBINED HYSTEROSCOPY DIAGNOSTIC / D&C  01/15/2015   CCOBGYN, Dr. Cletis Media  . HYSTEROSCOPY W/ ENDOMETRIAL ABLATION  01/15/2015   CCOBGYN, Dr. Cletis Media  . LAPAROSCOPIC TUBAL LIGATION  02/20/2011   Procedure: LAPAROSCOPIC TUBAL LIGATION;  Surgeon: Alwyn Pea, MD;  Location: [facility];  Service: Gynecology;  Laterality: Bilateral;  with Removal of Mirena and Robotic Assisted Bilateral Tubal Ligation  . LEFT HEART CATHETERIZATION WITH CORONARY ANGIOGRAM Bilateral 08/17/2013   Procedure: LEFT HEART CATHETERIZATION WITH CORONARY ANGIOGRAM;  Surgeon: Jettie Booze, MD;  Location: [facility] CATH LAB;  Service: Cardiovascular;  Laterality: Bilateral;  . ROBOTIC ASSISTED LAPAROSCOPIC OVARIAN CYSTECTOMY  02/2011  . TUBAL LIGATION  02/20/2011  . WISDOM TOOTH EXTRACTION       Current Outpatient Prescriptions  Medication Sig Dispense Refill  . amLODipine (NORVASC) 2.5 MG tablet Take 1 tablet (2.5 mg total) by mouth daily. 90 tablet 3  . aspirin 81 MG tablet Take 1 tablet (81 mg total) by mouth daily. 30 tablet 0  . Multiple Vitamin (MULTIVITAMIN) tablet Take 1 tablet by mouth daily.    . nitroGLYCERIN (NITROSTAT) 0.4 MG SL tablet Place 1 tablet (0.4 mg total) under the tongue every 5 (five) minutes x 3 doses as needed for chest pain. 25 tablet 12  . furosemide (LASIX) 20 MG tablet ONE TABLET BY MOUTH DAILY FOR 3 DAYS AND THEN AS NEEDED FOR SWELLING/SOB 30 tablet 2  . isosorbide mononitrate (IMDUR) 30 MG 24 hr tablet Take 1 tablet (30 mg total) by mouth daily. 30 tablet 5   No current facility-administered medications for this visit.     Allergies:   Vicodin [hydrocodone-acetaminophen]    Social History:  The patient  reports that she has never smoked. She has never used smokeless tobacco. She reports that she does not drink alcohol or use drugs.   Family History:  The patient's family history includes Breast cancer (age of onset: 90) in her paternal aunt; Diabetes in her father; Heart disease in her father and paternal grandmother; Hypertension in her brother, brother, and father; Uterine cancer in her mother.    ROS:  Please see the history of present illness.   Otherwise, review of systems are positive for none.   All other systems are reviewed and  negative.    PHYSICAL EXAM: VS:  BP 110/80   Pulse 84   Ht 4\' 11"  (1.499 m)   Wt 78 kg (172 lb)   BMI 34.74 kg/m  , BMI Body mass index is 34.74 kg/m.   GENERAL:  Well appearing.  No acute distress. HEENT:  Pupils equal round and reactive, fundi not visualized, oral mucosa unremarkable NECK:  No jugular venous distention, waveform within normal limits, carotid upstroke brisk and symmetric, no bruits LUNGS:  Clear to auscultation bilaterally.  No crackles, wheezes or rhonchi HEART:  RRR.  PMI not displaced or sustained,S1 and S2 within normal limits, no S3, no S4, no clicks, no rubs, no murmurs ABD:  Flat, positive bowel sounds normal in frequency in pitch, no bruits, no rebound, no  guarding, no midline pulsatile mass, no hepatomegaly, no splenomegaly EXT:  2 plus pulses throughout, no edema, no cyanosis no clubbing SKIN:  No rashes no nodules NEURO:  Cranial nerves II through XII grossly intact, motor grossly intact throughout PSYCH:  Cognitively intact, oriented to person place and time   EKG:  EKG is ordered today. 07/17/16: Sinus rhythm.  Rate 84 bpm.   Recent Labs: 11/21/2015: Brain Natriuretic Peptide 7.0; BUN 9; Creat 0.89; Hemoglobin 14.0; Platelets 266; Potassium 4.5; Sodium 138; TSH 1.11 07/17/2016: NT-Pro BNP 62    Lipid Panel    Component Value Date/Time   CHOL 197 07/08/2016 0958   TRIG 89 07/08/2016 0958   HDL 62 07/08/2016 0958   CHOLHDL 3.2 07/08/2016 0958   CHOLHDL 2.7 08/18/2013 0330   VLDL 18 08/18/2013 0330   LDLCALC 117 (H) 07/08/2016 0958      Wt Readings from Last 3 Encounters:  07/17/16 78 kg (172 lb)  04/17/16 78.9 kg (174 lb)  12/20/15 80.6 kg (177 lb 12.8 oz)      ASSESSMENT AND PLAN:  # SCAD:  # Chronic systolic and diastolic heart failure: [name] had SCAD of the LAD in 2015.  Follow-up CT scan showed no evidence of coronary dissection and normal coronary flow.  She was doing well, but for the last several weeks she has experienced  heart failure symptoms and intermittent chest pressure.  The symptoms are not as severe as when she had SCAD.  She does not appear volume overloaded on exam.  We will check a BNP.  She will take lasix 20mg  daily as needed for abdominal bloating, weight gain, shortness of breath and edema.  Her chest pressure has been responsive to sublingual nitroglycerin, so we will start Imdur 30 mg daily.  It is possible that she may have coronary spasm.  Continue amlodipine.  We will repeat her echo.  Continue aspirin.  # CV Disease Prevention:  Continue regular exercise. We will check fasting lipids.   Current medicines are reviewed at length with the patient today.  The patient does not have concerns regarding medicines.  The following changes have been made:  Start lasix and Imdur.  Labs/ tests ordered today include:   Orders Placed This Encounter  Procedures  . Pro b natriuretic peptide (BNP)  . EKG 12-Lead  . ECHOCARDIOGRAM COMPLETE      Disposition:   FU with Tanzie Rothschild C. Oval Linsey, MD, [facility] in 6-8 weeks.   This note was written with the assistance of speech recognition software.  Please excuse any transcriptional errors.  Signed, Sharnice Bosler C. Oval Linsey, MD, [facility]  07/18/2016 11:19 PM    [place]

## 2016-07-17 NOTE — Patient Instructions (Addendum)
Medication Instructions:  START LASIX (FUROSEMIDE) 20 MG DAILY FOR 3 DAYS THEN DECREASE TO AS NEEDED FOR SWELLING OR SHORTNESS OF BREATH   START ISOSORBIDE 30 MG DAILY DO NOT USE NTG ALONG WITH THIS   Labwork: BNP TODAY   Testing/Procedures: Your physician has requested that you have an echocardiogram. Echocardiography is a painless test that uses sound waves to create images of your heart. It provides your doctor with information about the size and shape of your heart and how well your heart's chambers and valves are working. This procedure takes approximately one hour. There are no restrictions for this procedure. [place] OFFICE   Follow-Up: Your physician recommends that you schedule a follow-up appointment in: 6-8 weeks  If you need a refill on your cardiac medications before your next appointment, please call your pharmacy.

## 2016-07-18 LAB — PRO B NATRIURETIC PEPTIDE: NT-Pro BNP: 62 pg/mL (ref 0–130)

## 2016-07-21 ENCOUNTER — Telehealth: Payer: Self-pay | Admitting: *Deleted

## 2016-07-21 NOTE — Telephone Encounter (Signed)
Called patient to follow up on Echo that should have been scheduled when she left office on 07/17/16 Stated she would have to call back and schedule

## 2016-07-21 NOTE — Telephone Encounter (Signed)
Spoke with patient and she is having headaches that are improving with Imdur. Discussed with Doreene Burke PA and he recommended trying decreasing to 1/2 Imdur. Recommendations given to patient, will continue current dose for now

## 2016-08-21 ENCOUNTER — Telehealth: Payer: Self-pay | Admitting: Cardiovascular Disease

## 2016-08-21 MED ORDER — ISOSORBIDE MONONITRATE ER 30 MG PO TB24: ORAL_TABLET | ORAL | 3 refills | Status: DC

## 2016-08-21 NOTE — Telephone Encounter (Signed)
New message    Pt c/o medication issue:  1. Name of Medication:isosorbide mononitrate (IMDUR) 30 MG 24 hr tablet  2. How are you currently taking this medication (dosage and times per day)? 30mg   3. Are you having a reaction (difficulty breathing--STAT)? Some SOB  4. What is your medication issue? Pt was told not to take nitroglycerin tablets with the IMDUR but she is still having problems and wants to know what should she do about this?

## 2016-08-21 NOTE — Telephone Encounter (Signed)
Patient does not monitor weight  Patient checks BP 7/5 5pm - 105/80 HR 86 7/4 am (felt bad) - lowest BP 97/76 HR 94, 98/78 HR 92 ** patient states her BP is generally low [id number] systolic

## 2016-08-21 NOTE — Telephone Encounter (Signed)
Called patient with PA advice. She voiced understanding of recommendations. Rx updated w/pharmacy. She states when she feels bad, her HR is generally higher (in the 90s, vs her usual in the 60s) and her BP tends to be on the lower side. Reiterated importance of BP monitoring when using nitrates. Advised that this message has alos been sent to primary cardiologist Dr. Oval Linsey for further review. Patient has OV on 7/19

## 2016-08-21 NOTE — Telephone Encounter (Signed)
Spoke to [place], [place] - she advised OK to try BID dosing of imdur - 30mg  AM, 15mg  PM and can use SL NTG prn. Should monitor BP, drink water, be sitting, not driving if need to use prn.

## 2016-08-21 NOTE — Telephone Encounter (Signed)
Returned call to patient. She is having anginal pain/chest pain + shortness of breath towards the end of day + fatigue & feels like heart racing.. She states this is an improvement from where she was. She states she was instructed to NOT use SL NTG prn with imdur. She states she had 2 really good weeks after starting imdur. She also states she is having abdominal swelling (a little in her feet) - has been using prn lasix. She would like advice on what to do regarding imdur dose - unsure if dose increase for BID dosing would be appropriate. Advised I would have to seek advice from MD + Rip Harbour, LPN

## 2016-09-01 ENCOUNTER — Other Ambulatory Visit: Payer: Self-pay

## 2016-09-01 ENCOUNTER — Ambulatory Visit (INDEPENDENT_AMBULATORY_CARE_PROVIDER_SITE_OTHER): Payer: Self-pay

## 2016-09-01 DIAGNOSIS — R0602 Shortness of breath: Secondary | ICD-10-CM

## 2016-09-01 DIAGNOSIS — R079 Chest pain, unspecified: Secondary | ICD-10-CM

## 2016-09-03 ENCOUNTER — Encounter: Payer: Self-pay | Admitting: Cardiovascular Disease

## 2016-09-03 ENCOUNTER — Ambulatory Visit (INDEPENDENT_AMBULATORY_CARE_PROVIDER_SITE_OTHER): Payer: Commercial Managed Care - PPO | Admitting: Cardiovascular Disease

## 2016-09-03 VITALS — BP 114/79 | HR 73 | Ht 59.0 in | Wt 170.8 lb

## 2016-09-03 DIAGNOSIS — R0602 Shortness of breath: Secondary | ICD-10-CM

## 2016-09-03 DIAGNOSIS — R079 Chest pain, unspecified: Secondary | ICD-10-CM | POA: Diagnosis not present

## 2016-09-03 DIAGNOSIS — I2542 Coronary artery dissection: Secondary | ICD-10-CM

## 2016-09-03 MED ORDER — ISOSORBIDE MONONITRATE ER 30 MG PO TB24: ORAL_TABLET | ORAL | 5 refills | Status: DC

## 2016-09-03 NOTE — Progress Notes (Signed)
Cardiology Office Note   Date:  09/03/2016   ID:  [name], DOB 04-01-1970, MRN [id number]  PCP:  Wendie Agreste, MD  Cardiologist:   Skeet Latch, MD   Chief Complaint  Patient presents with  . Follow-up  . Edema    abdominal.  . Dizziness    occasionally.      History of Present Illness: [name] is a 46 y.o. female with SCAD status post myocardial infarction who presents for follow up.  She had presumed SCAD of the LAD in 2015.  This was managed medically. LVEF was 35% by cath. She had a cardiac CT scan that subsequently did not show any dissection of the left main or left anterior descending. It noted subpleural pulmonary nodules and cholelithiasis. Coronary calcium score was 0.  A follow-up CT scan was recommended but this was not obtained due to cost. She had a subsequent echocardiogram 08/2013 that revealed LVEF 50-55% with akinesis of the apical myocardium and normal diastolic function. Follow-up echo 01/2014 revealed LVEF 45-50% with apical akinesis, grade 2 diastolic dysfunction.  She was previously a patient of Dr. Irish Lack but switched to Dr. Debara Pickett in 2016.  She last saw Dr. Debara Pickett on 05/17/15 and reported chest pain and palpitations that occurred randomly.  She felt that they might be related to her menstrual cycles. Her EKG was unremarkable at that time. She has been treated with aspirin and metoprolol but developed dizziness with metoprolol. It was felt that the symptoms were unlikely to be related to recurrent coronary artery dissection and either calcium channel blocker or long-acting nitrates were recommended due to possible coronary spasm. She elected to switch providers again. She followed up with Melina Copa October and November 2017 with a complaint of shortness of breath. A repeat echocardiogram was ordered but has not been performed. She was started on amlodipine 2.5 mg with improvement in her symptoms.   At her last appointment [name] complaint of  chest pain and symptoms of heart failure.  there is no evidence of volume overload and BNP was 62. She was given Lasix to take as needed and instructed not to take it unless she had edema , weight gain. She was also started on Imdur for possible coronary spasms. She was referred for an echocardiogram 09/01/16 that revealed LVEF 55-60% and normal diastolic function.  Since her last appointment [name] has been doing better. For the first week and a half after starting Imdur she had severe headaches. This has subsided. She also noted that she had recurrent symptoms in the afternoon and was started on an additional 15 mg in the afternoon. This seems to have helped. She notes that about an hour before her afternoon episode her symptoms recur.  The Imdur has also helped her vision. She has been able to start back exercising some. However, she still has fatigue and some discomfort with lifting heavy objects. She continues to have intermittent abdominal bloating. She takes Lasix when this occurs in this seems to help. She is unable to weigh herself at home. She reports poor appetite and difficulty sleeping at night. She has not noted any lower extremity edema, orthopnea, or PND. She doesn't list feeling very stressed. She works in [employer], academic and has an adult daughter who is very immature.   Past Medical History:  Diagnosis Date  . Acid reflux   . Anxiety   . Asthmatic bronchitis    a. Typically when the weather changes.  . Cholelithiasis   .  Coronary artery dissection    a. s/p LHC 08/17/13 SCAD which likely originated in the mid LAD and propagated retrograde back to the ostium of the left main. Treated medically.   . H/O varicella   . Herniated disc, cervical   . Hypotension    a. prev taken off BB due to BP 16X-09U systolic.  . Ischemic cardiomyopathy    a. EF 35%, LV apical hypokinesia by cath 08/17/13 - f/u echo 08/2013 EF 50-55%.  . Lung nodule    a. incidental finding on CT 08/2013. Needs follow up  in 08/2014  . Menstrual migraine   . Microcytosis    a. Low ferritin 11/2013.  . Monilial vaginitis 2005  . Neuromuscular disorder ([facility])   . Obesity, Class II, BMI 35-39.9   . Ovarian cyst, left 2012  . PMS (premenstrual syndrome)   . PVC's (premature ventricular contractions)     Past Surgical History:  Procedure Laterality Date  . COMBINED HYSTEROSCOPY DIAGNOSTIC / D&C  01/15/2015   [facility], Dr. Cletis Media  . HYSTEROSCOPY W/ ENDOMETRIAL ABLATION  01/15/2015   [facility], Dr. Cletis Media  . LAPAROSCOPIC TUBAL LIGATION  02/20/2011   Procedure: LAPAROSCOPIC TUBAL LIGATION;  Surgeon: Alwyn Pea, MD;  Location: [facility];  Service: Gynecology;  Laterality: Bilateral;  with Removal of Mirena and Robotic Assisted Bilateral Tubal Ligation  . LEFT HEART CATHETERIZATION WITH CORONARY ANGIOGRAM Bilateral 08/17/2013   Procedure: LEFT HEART CATHETERIZATION WITH CORONARY ANGIOGRAM;  Surgeon: Jettie Booze, MD;  Location: [facility] CATH [facility];  Service: Cardiovascular;  Laterality: Bilateral;  . ROBOTIC ASSISTED LAPAROSCOPIC OVARIAN CYSTECTOMY  02/2011  . TUBAL LIGATION  02/20/2011  . WISDOM TOOTH EXTRACTION       Current Outpatient Prescriptions  Medication Sig Dispense Refill  . amLODipine (NORVASC) 2.5 MG tablet Take 1 tablet (2.5 mg total) by mouth daily. 90 tablet 3  . aspirin 81 MG tablet Take 1 tablet (81 mg total) by mouth daily. 30 tablet 0  . furosemide (LASIX) 20 MG tablet ONE TABLET BY MOUTH DAILY FOR 3 DAYS AND THEN AS NEEDED FOR SWELLING/SOB 30 tablet 2  . isosorbide mononitrate (IMDUR) 30 MG 24 hr tablet Take 1 and 1/2 (45mg ) tablets by mouth in the morning and 1/2 tablet (15mg ) in the evening. 60 tablet 5  . Multiple Vitamin (MULTIVITAMIN) tablet Take 1 tablet by mouth daily.    . nitroGLYCERIN (NITROSTAT) 0.4 MG SL tablet Place 1 tablet (0.4 mg total) under the tongue every 5 (five) minutes x 3 doses as needed for chest pain. 25 tablet 12   No current facility-administered medications for this  visit.     Allergies:   Vicodin [hydrocodone-acetaminophen]    Social History:  The patient  reports that she has never smoked. She has never used smokeless tobacco. She reports that she does not drink alcohol or use drugs.   Family History:  The patient's family history includes Breast cancer (age of onset: 32) in her paternal aunt; Diabetes in her father; Heart disease in her father and paternal grandmother; Hypertension in her brother, brother, and father; Uterine cancer in her mother.    ROS:  Please see the history of present illness.   Otherwise, review of systems are positive for none.   All other systems are reviewed and negative.    PHYSICAL EXAM: VS:  BP 114/79   Pulse 73   Ht 4\' 11"  (1.499 m)   Wt 77.5 kg (170 lb 12.8 oz)   BMI 34.50 kg/m  ,  BMI Body mass index is 34.5 kg/m. GENERAL:  Well appearing HEENT:  Pupils equal round and reactive, fundi not visualized, oral mucosa unremarkable NECK:  No jugular venous distention, waveform within normal limits, carotid upstroke brisk and symmetric, no bruits, no thyromegaly LUNGS:  Clear to auscultation bilaterally HEART:  RRR.  PMI not displaced or sustained,S1 and S2 within normal limits, no S3, no S4, no clicks, no rubs, no murmurs ABD:  Flat, positive bowel sounds normal in frequency in pitch, no bruits, no rebound, no guarding, no midline pulsatile mass, no hepatomegaly, no splenomegaly EXT:  2 plus pulses throughout, no edema, no cyanosis no clubbing SKIN:  No rashes no nodules NEURO:  Cranial nerves II through XII grossly intact, motor grossly intact throughout PSYCH:  Cognitively intact, oriented to person place and time   EKG:  EKG is not ordered today. 07/17/16: Sinus rhythm.  Rate 84 bpm.   Echo 09/01/16: Study Conclusions  - Left ventricle: The cavity size was normal. Wall thickness was at   the upper limits of normal. Systolic function was normal. The   estimated ejection fraction was in the range of 55% to 60%.  Wall   motion was normal; there were no regional wall motion   abnormalities. Left ventricular diastolic function parameters   were normal. - Right ventricle: The cavity size was normal. Systolic function   was normal.   Impressions:  - Normal study. Previously noted apical wall motion abnormalities   is not evident on today&'s study.  Recent Labs: 11/21/2015: Brain Natriuretic Peptide 7.0; BUN 9; Creat 0.89; Hemoglobin 14.0; Platelets 266; Potassium 4.5; Sodium 138; TSH 1.11 07/17/2016: NT-Pro BNP 62    Lipid Panel    Component Value Date/Time   CHOL 197 07/08/2016 0958   TRIG 89 07/08/2016 0958   HDL 62 07/08/2016 0958   CHOLHDL 3.2 07/08/2016 0958   CHOLHDL 2.7 08/18/2013 0330   VLDL 18 08/18/2013 0330   LDLCALC 117 (H) 07/08/2016 0958      Wt Readings from Last 3 Encounters:  09/03/16 77.5 kg (170 lb 12.8 oz)  07/17/16 78 kg (172 lb)  04/17/16 78.9 kg (174 lb)      ASSESSMENT AND PLAN:  # SCAD:  # Chronic systolic and diastolic heart failure: [name] had SCAD of the LAD in 2015.  Follow-up CT scan showed no evidence of coronary dissection and normal coronary flow.  In retrospect this may have been coronary spasm.  She is doing better on Imdur.  We will increase her AM dose to 45mg .  Her echo showed complete resolution of her prior systolic and diastolic dysfunction. It is unclear to me what is causing her abdominal bloating. Her BNP was within normal limits. It does not seem that this is due to heart failure. Lasix seems to help. I think it is okay for her to continue taking this as needed, but have encouraged her to take it only if her symptoms are severe. Continue aspirin and amlodipine.   # CV Disease Prevention:  LDL 117.  Goal is <130.  Increase exercise.   Current medicines are reviewed at length with the patient today.  The patient does not have concerns regarding medicines.  The following changes have been made:  Start lasix and Imdur.  Labs/ tests  ordered today include:   No orders of the defined types were placed in this encounter.     Disposition:   FU with Lauro Manlove C. Oval Linsey, MD, [facility] in 4 months.  This note was written with the assistance of speech recognition software.  Please excuse any transcriptional errors.  Signed, Floyd Lusignan C. Oval Linsey, MD, [facility]  09/03/2016 10:36 AM    [place]

## 2016-09-03 NOTE — Patient Instructions (Addendum)
Medication Instructions:  INCREASE YOUR ISOSORBIDE TO 45 MG IN THE MORNING AND KEEP YOUR 15 MG IN THE EVENING   Labwork: NONE  Testing/Procedures: NONE  Follow-Up: Your physician recommends that you schedule a follow-up appointment in: [place]  If you need a refill on your cardiac medications before your next appointment, please call your pharmacy.

## 2016-12-04 ENCOUNTER — Other Ambulatory Visit: Payer: Self-pay | Admitting: Physician Assistant

## 2016-12-30 ENCOUNTER — Encounter: Payer: Self-pay | Admitting: Cardiovascular Disease

## 2017-01-01 ENCOUNTER — Telehealth: Payer: Self-pay | Admitting: *Deleted

## 2017-01-01 NOTE — Telephone Encounter (Signed)
  Spoke with patient and she received message She will send message next week with update on how she is doing so new Rx can be sent to pharmacy. She did want to move her appointment to January secondary to having met her limit of office visits for the year with her insurance.    Lets try holding the amlodipine and increasing the Imdur to '45mg'$  bid.    TCR  ----- Message -----  From: Fidel Levy, RN  Sent: 12/30/2016  1:01 PM  To: Skeet Latch, MD, [facility]  Subject: Non-Urgent Medical Question             ----- Message from Fidel Levy, RN sent at 12/30/2016 1:01 PM EST -----      ----- Message from [place], Raynelle Highland to Skeet Latch, MD sent at 12/30/2016 12:54 PM -----   Kermit Balo Afternoon,  I have a medication question re: Amlodipine   Started having extreme tireness all day, abdominal swelling, and joint pain in my left hand and lower back /hip area a few weeks ago. Also feeling faint / dizzy from bending over, putting on socks & shoes. Blood Pressure was a little lower around 93/70 at times. Heart racing with some simple housework tasks. I stopped takine the Amlodipine for 4 days and everything is better. My thinking is clearer and I can fuction better at work. I had to stop the previous Beta Blocker after about a year for the same reason.     I still get chest, upper back pain sometimes towards the end of the day or with exertion. I have to stop and rest or it will increase. The Imdur seems to help, but still not enough to get through the day without the pain setting in.     What would you recommend at this point?

## 2017-01-05 ENCOUNTER — Other Ambulatory Visit: Payer: Self-pay | Admitting: *Deleted

## 2017-01-05 ENCOUNTER — Telehealth: Payer: Self-pay | Admitting: Cardiovascular Disease

## 2017-01-05 MED ORDER — ISOSORBIDE MONONITRATE ER 30 MG PO TB24: ORAL_TABLET | ORAL | 5 refills | Status: DC

## 2017-01-05 MED ORDER — NITROGLYCERIN 0.4 MG SL SUBL
0.4000 mg | SUBLINGUAL_TABLET | SUBLINGUAL | 4 refills | Status: DC | PRN
Start: 2017-01-05 — End: 2018-01-26

## 2017-01-05 MED ORDER — ISOSORBIDE MONONITRATE ER 30 MG PO TB24: 15.0000 mg | ORAL_TABLET | Freq: Two times a day (BID) | ORAL | 3 refills | Status: DC

## 2017-01-05 NOTE — Telephone Encounter (Signed)
Rx(s) sent to pharmacy electronically.  

## 2017-01-05 NOTE — Telephone Encounter (Signed)
°*  STAT* If patient is at the pharmacy, call can be transferred to refill team.   1. Which medications need to be refilled? (please list name of each medication and dose if known) please call today,she is out of her Imdur and Nitroglycerin  2. Which pharmacy/location (including street and city if local pharmacy) is medication to be sent to?[facility],[place]  3. Do they need a 30 day or 90 day supply? 30 for Imdur and a bottle of Nitroglycerin

## 2017-01-05 NOTE — Telephone Encounter (Signed)
See mychart message from 11/19 regarding dose change of Imdur

## 2017-01-11 ENCOUNTER — Ambulatory Visit: Payer: Commercial Managed Care - PPO | Admitting: Cardiovascular Disease

## 2017-02-18 ENCOUNTER — Ambulatory Visit: Payer: Commercial Managed Care - PPO | Admitting: Cardiovascular Disease

## 2017-03-16 ENCOUNTER — Ambulatory Visit: Payer: Commercial Managed Care - PPO | Admitting: Cardiovascular Disease

## 2017-03-18 ENCOUNTER — Ambulatory Visit: Payer: Commercial Managed Care - PPO | Admitting: Cardiovascular Disease

## 2017-04-01 ENCOUNTER — Ambulatory Visit (INDEPENDENT_AMBULATORY_CARE_PROVIDER_SITE_OTHER): Payer: Commercial Managed Care - PPO | Admitting: Physician Assistant

## 2017-04-01 ENCOUNTER — Encounter: Payer: Self-pay | Admitting: Physician Assistant

## 2017-04-01 VITALS — BP 104/80 | HR 79 | Ht 59.0 in | Wt 172.0 lb

## 2017-04-01 DIAGNOSIS — R911 Solitary pulmonary nodule: Secondary | ICD-10-CM | POA: Diagnosis not present

## 2017-04-01 DIAGNOSIS — I25118 Atherosclerotic heart disease of native coronary artery with other forms of angina pectoris: Secondary | ICD-10-CM

## 2017-04-01 DIAGNOSIS — R0609 Other forms of dyspnea: Secondary | ICD-10-CM | POA: Diagnosis not present

## 2017-04-01 MED ORDER — ISOSORBIDE DINITRATE 10 MG PO TABS: 10.0000 mg | ORAL_TABLET | Freq: Three times a day (TID) | ORAL | 6 refills | Status: DC

## 2017-04-01 NOTE — Patient Instructions (Signed)
Medication Instructions:  START Isordil 10mg  Take 1 tablet three times a day; ok to adjust dose and timing for better symptom control  Labwork: None  Testing/Procedures: None   Follow-Up: Your physician recommends that you schedule a follow-up appointment in: [place] with Dr Oval Linsey.  Any Other Special Instructions Will Be Listed Below (If Applicable). If you need a refill on your cardiac medications before your next appointment, please call your pharmacy.

## 2017-04-01 NOTE — Progress Notes (Signed)
Cardiology Office Note   Date:  04/01/2017   ID:  [name], DOB [birth year], MRN [id number]  PCP:  Wendie Agreste, MD  Cardiologist: Dr Varanasi>>Dr Hilty>>Dr. Oval Linsey, 09/03/2016 Rosaria Ferries, PA-C   Chief Complaint  Patient presents with  . Follow-up  . Headache  . Chest Pain    In the evenings when the weather is bad and when she is stressed and fatigue.  . Edema    Feet whenever her stomach swells.    History of Present Illness: [name] is a 47 y.o. 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 Oval Linsey, patient complaining of abdominal bloating, okay to take Lasix if the symptoms are severe but volume status was good, on aspirin, Imdur and amlodipine for probable coronary spasm  [name] presents for cardiology follow up.  She is not tolerating the cold. Her symptoms will return in the evening.  She is tired during the day, and then in the evening, she will have palpitations and feel her heart race. However, her heart rate will be < 100.    She will get abdominal bloating in the evenings. She will have chest pain and upper back pain in the evenings as well.   She has not been able to exercise much because of the weather.   She has been having some SOB, DOE.  No lower extremity edema, no orthopnea or PND.  She gets so tired during the week that she is not able to do regular household work. She is also having to spend the weekends resting up.   All of the symptoms improved after she was started on the Imdur and the amlodipine.  However, the amlodipine dropped her blood pressure too much and she had to stop it.  She keeps having to go up on the Imdur to get the same symptom relief, but it is not working and she is up to 75 mg a day.   Past Medical History:    Diagnosis Date  . Acid reflux   . Anxiety   . Asthmatic bronchitis    a. Typically when the weather changes.  . Cholelithiasis   . Coronary artery dissection    a. s/p LHC 08/17/13 SCAD which likely originated in the mid LAD and propagated retrograde back to the ostium of the left main. Treated medically.   . H/O varicella   . Herniated disc, cervical   . Hypotension    a. prev taken off BB due to BP 30Q-65H systolic.  . Ischemic cardiomyopathy    a. EF 35%, LV apical hypokinesia by cath 08/17/13 - f/u echo 08/2013 EF 50-55%.  . Lung nodule    a. incidental finding on CT 08/2013. Needs follow up in 08/2014  . Menstrual migraine   . Microcytosis    a. Low ferritin 11/2013.  . Monilial vaginitis 2005  . Neuromuscular disorder ([place])   . Obesity, Class II, BMI 35-39.9   . Ovarian cyst, left 2012  . PMS (premenstrual syndrome)   . PVC's (premature ventricular contractions)     Past Surgical History:  Procedure Laterality Date  . COMBINED HYSTEROSCOPY DIAGNOSTIC / D&C  01/15/2015   [facility], Dr. Cletis Media  . HYSTEROSCOPY W/ ENDOMETRIAL ABLATION  01/15/2015   [facility], Dr. Cletis Media  . LAPAROSCOPIC TUBAL LIGATION  02/20/2011   Procedure: LAPAROSCOPIC TUBAL LIGATION;  Surgeon: Alwyn Pea, MD;  Location: [facility];  Service: Gynecology;  Laterality: Bilateral;  with Removal of Mirena and Robotic Assisted Bilateral Tubal Ligation  . LEFT HEART CATHETERIZATION WITH CORONARY ANGIOGRAM Bilateral 08/17/2013   Procedure: LEFT HEART CATHETERIZATION WITH CORONARY ANGIOGRAM;  Surgeon: Jettie Booze, MD;  Location: [facility] CATH LAB;  Service: Cardiovascular;  Laterality: Bilateral;  . ROBOTIC ASSISTED LAPAROSCOPIC OVARIAN CYSTECTOMY  02/2011  . TUBAL LIGATION  02/20/2011  . WISDOM TOOTH EXTRACTION      Current Outpatient Medications  Medication Sig Dispense Refill  . aspirin 81 MG tablet Take 1 tablet (81 mg total) by mouth daily. 30 tablet 0  . furosemide (LASIX) 20 MG tablet ONE TABLET BY MOUTH DAILY FOR  3 DAYS AND THEN AS NEEDED FOR SWELLING/SOB 30 tablet 2  . isosorbide mononitrate (IMDUR) 30 MG 24 hr tablet 2 tablets by mouth in the morning and 1 tablet in the evening (Patient taking differently: Take 75 mg by mouth daily. ) 90 tablet 5  . Multiple Vitamin (MULTIVITAMIN) tablet Take 1 tablet by mouth daily.    . nitroGLYCERIN (NITROSTAT) 0.4 MG SL tablet Place 1 tablet (0.4 mg total) under the tongue every 5 (five) minutes x 3 doses as needed for chest pain. 25 tablet 4   No current facility-administered medications for this visit.     Allergies:   Vicodin [hydrocodone-acetaminophen]    Social History:  The patient  reports that  has never smoked. she has never used smokeless tobacco. She reports that she does not drink alcohol or use drugs.   Family History:  The patient's family history includes Breast cancer (age of onset: 7) in her paternal aunt; Diabetes in her father; Heart disease in her father and paternal grandmother; Hypertension in her brother, brother, and father; Uterine cancer in her mother.    ROS:  Please see the history of present illness. All other systems are reviewed and negative.    PHYSICAL EXAM: VS:  BP 104/80   Pulse 79   Ht 4\' 11"  (1.499 m)   Wt 172 lb (78 kg)   BMI 34.74 kg/m  , BMI Body mass index is 34.74 kg/m. GEN: Well nourished, well developed, female in no acute distress  HEENT: normal for age  Neck: no JVD, no carotid bruit, no masses Cardiac: RRR; soft murmur, no rubs, or gallops Respiratory:  clear to auscultation bilaterally, normal work of breathing GI: soft, nontender, nondistended, + BS MS: no deformity or atrophy; no edema; distal pulses are 2+ in all 4 extremities   Skin: warm and dry, no rash Neuro:  Strength and sensation are intact Psych: euthymic mood, full affect   EKG:  EKG is ordered today. The ekg ordered today demonstrates sinus rhythm, heart rate 75, no acute ischemic changes, no Q waves, normal intervals  Echo  09/01/16: Study Conclusions - Left ventricle: The cavity size was normal. Wall thickness was at the upper limits of normal. Systolic function was normal. The estimated ejection fraction was in the range of 55% to 60%. Wall motion was normal; there were no regional wall motion abnormalities. Left ventricular diastolic function parameters were normal. - Right ventricle: The cavity size was normal. Systolic function was normal.  Impressions: - Normal study. Previously noted apical wall motion abnormalities is not evident on today&'s study   Recent Labs: 07/17/2016: NT-Pro BNP 62    Lipid Panel    Component Value Date/Time   CHOL  197 07/08/2016 0958   TRIG 89 07/08/2016 0958   HDL 62 07/08/2016 0958   CHOLHDL 3.2 07/08/2016 0958   CHOLHDL 2.7 08/18/2013 0330   VLDL 18 08/18/2013 0330   LDLCALC 117 (H) 07/08/2016 0958     Wt Readings from Last 3 Encounters:  04/01/17 172 lb (78 kg)  09/03/16 170 lb 12.8 oz (77.5 kg)  07/17/16 172 lb (78 kg)     Other studies Reviewed: Additional studies/ records that were reviewed today include: Office notes, hospital records and testing.  ASSESSMENT AND PLAN:  1.  Chest pain: It makes sense that she has problems with coronary spasm since it was thought she had coronary dissection, but a later CT did not show any evidence of dissection or of CAD.  Her symptoms were initially helped by the Imdur, but it seems to wear off by the evening and she is continually increasing the dose.  I will try changing her to Isordil 10 mg 3 times daily and see if this helps her symptoms as much.  She understands that she can push the middle dose back to 5 PM to try and only take 2 doses a day.  I explained that a nitrate free interval might help keep her from having to increase the dose.  She is to call if the dose needs to be increased.  Another alternative is a nitro patch, but I am not sure that will work as well for her.  If the Isordil does not  work, try Ranexa.  2. DOE, Abdominal bloating: It seems as though she retains fluids during the day and then has nocturia several times a night to get rid of them.  I explained that she needs to stay well-hydrated to keep her blood pressure up, but she may be getting too much salt.  She just needs to be aware of the fluid/salt balance and adjust intake to keep her self stable.  3.  Pulmonary nodule: Hinton Dyer Dunn's note from 12/2015 mentions referral to pulmonary nodule clinic.  Patient states she was told that she does not need any further CTs and that it is stable, probably coming from multiple previous episodes of bronchitis.  She is comfortable with this and does not wish to pursue further evaluation.   Current medicines are reviewed at length with the patient today.  The patient has concerns regarding medicines.  Concerns were addressed  The following changes have been made: Hold Imdur, start Isordil  Labs/ tests ordered today include:   No orders of the defined types were placed in this encounter.   Disposition:   FU with Dr. Oval Linsey  Signed, Rosaria Ferries, PA-C  04/01/2017 8:55 AM    [place] Phone: [phone]; Fax: [phone]  This note was written with the assistance of speech recognition software. Please excuse any transcriptional errors.

## 2017-05-19 ENCOUNTER — Telehealth: Payer: Self-pay | Admitting: Cardiovascular Disease

## 2017-05-19 NOTE — Telephone Encounter (Signed)
FORWARD TO DR Highland Holiday

## 2017-05-19 NOTE — Telephone Encounter (Signed)
Follow Up:   [name] wants Dr Oval Linsey to know that the change she made is working and she feels much better.She moved her 05-21-17 appt to 07-13-17.

## 2017-05-19 NOTE — Telephone Encounter (Signed)
OK - thanks

## 2017-05-21 ENCOUNTER — Ambulatory Visit: Payer: Commercial Managed Care - PPO | Admitting: Cardiovascular Disease

## 2017-06-16 ENCOUNTER — Telehealth: Payer: Self-pay | Admitting: Cardiovascular Disease

## 2017-06-16 MED ORDER — FUROSEMIDE 20 MG PO TABS: ORAL_TABLET | ORAL | 2 refills | Status: DC

## 2017-06-16 NOTE — Telephone Encounter (Signed)
New message     *STAT* If patient is at the pharmacy, call can be transferred to refill team.   1. Which medications need to be refilled? (please list name of each medication and dose if known) furosemide (LASIX) 20 MG tablet  2. Which pharmacy/location (including street and city if local pharmacy) is medication to be sent to?[id number] [place], [place] - [address]. Do they need a 30 day or 90 day supply? [place]

## 2017-06-29 ENCOUNTER — Encounter: Payer: Self-pay | Admitting: Cardiovascular Disease

## 2017-07-01 ENCOUNTER — Telehealth: Payer: Self-pay | Admitting: Cardiovascular Disease

## 2017-07-01 NOTE — Telephone Encounter (Signed)
Scheduled appointment for patient to be seen tomorrow.

## 2017-07-01 NOTE — Telephone Encounter (Signed)
New message  Pt verbalzied that she is calling for RN  She want to know if Dr. or RN has   read her e-mail 06/29/2017 and can please call her

## 2017-07-02 ENCOUNTER — Encounter: Payer: Self-pay | Admitting: Cardiovascular Disease

## 2017-07-02 ENCOUNTER — Ambulatory Visit (INDEPENDENT_AMBULATORY_CARE_PROVIDER_SITE_OTHER): Payer: Commercial Managed Care - PPO | Admitting: Cardiovascular Disease

## 2017-07-02 VITALS — BP 112/74 | HR 84 | Ht 59.0 in | Wt 170.6 lb

## 2017-07-02 DIAGNOSIS — R0602 Shortness of breath: Secondary | ICD-10-CM

## 2017-07-02 DIAGNOSIS — M791 Myalgia, unspecified site: Secondary | ICD-10-CM

## 2017-07-02 DIAGNOSIS — I5043 Acute on chronic combined systolic (congestive) and diastolic (congestive) heart failure: Secondary | ICD-10-CM

## 2017-07-02 DIAGNOSIS — I25118 Atherosclerotic heart disease of native coronary artery with other forms of angina pectoris: Secondary | ICD-10-CM | POA: Diagnosis not present

## 2017-07-02 DIAGNOSIS — R5383 Other fatigue: Secondary | ICD-10-CM | POA: Diagnosis not present

## 2017-07-02 MED ORDER — ISOSORBIDE MONONITRATE ER 60 MG PO TB24: 60.0000 mg | ORAL_TABLET | Freq: Two times a day (BID) | ORAL | 5 refills | Status: DC

## 2017-07-02 NOTE — Progress Notes (Signed)
Cardiology Office Note   Date:  07/02/2017   ID:  Shaneka Efaw, DOB Jul 11, 1970, MRN [id number]  PCP:  Wendie Agreste, MD  Cardiologist:   Skeet Latch, MD   Chief Complaint  Patient presents with  . Follow-up     History of Present Illness: Audia Nodine is a 47 y.o. female with SCAD status post myocardial infarction who presents for follow up.  She had presumed SCAD of the LAD in 2015.  This was managed medically. LVEF was 35% by cath. She had a cardiac CT scan that subsequently did not show any dissection of the left main or left anterior descending. It noted subpleural pulmonary nodules and cholelithiasis. Coronary calcium score was 0.  A follow-up CT scan was recommended but this was not obtained due to cost. She had a subsequent echocardiogram 08/2013 that revealed LVEF 50-55% with akinesis of the apical myocardium and normal diastolic function. Follow-up echo 01/2014 revealed LVEF 45-50% with apical akinesis, grade 2 diastolic dysfunction.  She was previously a patient of Dr. Irish Lack but switched to Dr. Debara Pickett in 2016.  She last saw Dr. Debara Pickett on 05/17/15 and reported chest pain and palpitations that occurred randomly.  She felt that they might be related to her menstrual cycles. Her EKG was unremarkable at that time. She has been treated with aspirin and metoprolol but developed dizziness with metoprolol. It was felt that the symptoms were unlikely to be related to recurrent coronary artery dissection and either calcium channel blocker or long-acting nitrates were recommended due to possible coronary spasm. She elected to switch providers again. She followed up with Melina Copa October and November 2017 with a complaint of shortness of breath. A repeat echocardiogram was ordered but has not been performed. She was started on amlodipine 2.5 mg with improvement in her symptoms.   Ms. Heidel continues to report chest pain and symptoms of heart failure.  There is no evidence of volume  overload and BNP 07/2016 was 62. She was given Lasix to take as needed and instructed not to take it unless she had edema , weight gain. She also takes Imdur for possible coronary spasms. She was referred for an echocardiogram 09/01/16 that revealed LVEF 55-60% and normal diastolic function. Ms. Schmuck reports increased fatigue lately.  If she tries to go to the gym she is extremely fatigued for the next several days.  She has abdominal bloating that she attributes to volume overload.  Her weight hasn't changed and it hasn't responded to Lasix.   Since her heart attack she also reports difficulty with cognitive tasks.  She feels as though she cannot process as well as she would in the past.  She has increased forgetfulness.  In the days after she exercises to get so bad that she feels unable to go to work.  She has a family history of Graves' disease and wonders if her thyroid could be the problem.  Her weight has been stable.  She has no palpitations constipation, or hair changes.  Her appetite has been poor.  She denies any rashes but does have diffuse joint pains that are worse in her hips and legs.  Sometimes her arms hurt so much that it is difficult for her to do her hair or climb stairs.   Past Medical History:  Diagnosis Date  . Acid reflux   . Anxiety   . Asthmatic bronchitis    a. Typically when the weather changes.  . Cholelithiasis   . Coronary artery  dissection    a. s/p LHC 08/17/13 SCAD which likely originated in the mid LAD and propagated retrograde back to the ostium of the left main. Treated medically.   . H/O varicella   . Herniated disc, cervical   . Hypotension    a. prev taken off BB due to BP 54M-08Q systolic.  . Ischemic cardiomyopathy    a. EF 35%, LV apical hypokinesia by cath 08/17/13 - f/u echo 08/2013 EF 50-55%.  . Lung nodule    a. incidental finding on CT 08/2013. Needs follow up in 08/2014  . Menstrual migraine   . Microcytosis    a. Low ferritin 11/2013.  . Monilial  vaginitis 2005  . Neuromuscular disorder ([place])   . Obesity, Class II, BMI 35-39.9   . Ovarian cyst, left 2012  . PMS (premenstrual syndrome)   . PVC's (premature ventricular contractions)     Past Surgical History:  Procedure Laterality Date  . COMBINED HYSTEROSCOPY DIAGNOSTIC / D&C  01/15/2015   CCOBGYN, Dr. Cletis Media  . HYSTEROSCOPY W/ ENDOMETRIAL ABLATION  01/15/2015   CCOBGYN, Dr. Cletis Media  . LAPAROSCOPIC TUBAL LIGATION  02/20/2011   Procedure: LAPAROSCOPIC TUBAL LIGATION;  Surgeon: Alwyn Pea, MD;  Location: [facility];  Service: Gynecology;  Laterality: Bilateral;  with Removal of Mirena and Robotic Assisted Bilateral Tubal Ligation  . LEFT HEART CATHETERIZATION WITH CORONARY ANGIOGRAM Bilateral 08/17/2013   Procedure: LEFT HEART CATHETERIZATION WITH CORONARY ANGIOGRAM;  Surgeon: Jettie Booze, MD;  Location: [facility] CATH LAB;  Service: Cardiovascular;  Laterality: Bilateral;  . ROBOTIC ASSISTED LAPAROSCOPIC OVARIAN CYSTECTOMY  02/2011  . TUBAL LIGATION  02/20/2011  . WISDOM TOOTH EXTRACTION       Current Outpatient Medications  Medication Sig Dispense Refill  . aspirin 81 MG tablet Take 1 tablet (81 mg total) by mouth daily. 30 tablet 0  . furosemide (LASIX) 20 MG tablet ONE TABLET BY MOUTH DAILY FOR 3 DAYS AND THEN AS NEEDED FOR SWELLING/SOB 30 tablet 2  . isosorbide mononitrate (IMDUR) 60 MG 24 hr tablet Take 1 tablet (60 mg total) by mouth 2 (two) times daily. 60 tablet 5  . Multiple Vitamin (MULTIVITAMIN) tablet Take 1 tablet by mouth daily.    . nitroGLYCERIN (NITROSTAT) 0.4 MG SL tablet Place 1 tablet (0.4 mg total) under the tongue every 5 (five) minutes x 3 doses as needed for chest pain. 25 tablet 4   No current facility-administered medications for this visit.     Allergies:   Vicodin [hydrocodone-acetaminophen]    Social History:  The patient  reports that she has never smoked. She has never used smokeless tobacco. She reports that she does not drink alcohol or use  drugs.   Family History:  The patient's family history includes Breast cancer (age of onset: 32) in her paternal aunt; Diabetes in her father; Heart disease in her father and paternal grandmother; Hypertension in her brother, brother, and father; Uterine cancer in her mother.    ROS:  Please see the history of present illness.   Otherwise, review of systems are positive for none.   All other systems are reviewed and negative.    PHYSICAL EXAM: VS:  BP 112/74   Pulse 84   Ht '4\' 11"'$  (1.499 m)   Wt 170 lb 9.6 oz (77.4 kg)   BMI 34.46 kg/m  , BMI Body mass index is 34.46 kg/m. GENERAL:  Well appearing HEENT: Pupils equal round and reactive, fundi not visualized, oral mucosa unremarkable NECK:  No jugular  venous distention, waveform within normal limits, carotid upstroke brisk and symmetric, no bruits LUNGS:  Clear to auscultation bilaterally HEART:  RRR.  PMI not displaced or sustained,S1 and S2 within normal limits, no S3, no S4, no clicks, no rubs, no murmurs ABD:  Flat, positive bowel sounds normal in frequency in pitch, no bruits, no rebound, no guarding, no midline pulsatile mass, no hepatomegaly, no splenomegaly EXT:  2 plus pulses throughout, no edema, no cyanosis no clubbing SKIN:  No rashes no nodules NEURO:  Cranial nerves II through XII grossly intact, motor grossly intact throughout PSYCH:  Cognitively intact, oriented to person place and time  EKG:  EKG is ordered today. 07/17/16: Sinus rhythm.  Rate 84 bpm.  07/02/17: Sinus rhythm.  Rate 84 bpm.  Echo 09/01/16: Study Conclusions  - Left ventricle: The cavity size was normal. Wall thickness was at   the upper limits of normal. Systolic function was normal. The   estimated ejection fraction was in the range of 55% to 60%. Wall   motion was normal; there were no regional wall motion   abnormalities. Left ventricular diastolic function parameters   were normal. - Right ventricle: The cavity size was normal. Systolic  function   was normal.   Impressions:  - Normal study. Previously noted apical wall motion abnormalities   is not evident on today&'s study.  Recent Labs: 07/17/2016: NT-Pro BNP 62    Lipid Panel    Component Value Date/Time   CHOL 197 07/08/2016 0958   TRIG 89 07/08/2016 0958   HDL 62 07/08/2016 0958   CHOLHDL 3.2 07/08/2016 0958   CHOLHDL 2.7 08/18/2013 0330   VLDL 18 08/18/2013 0330   LDLCALC 117 (H) 07/08/2016 0958      Wt Readings from Last 3 Encounters:  07/02/17 170 lb 9.6 oz (77.4 kg)  04/01/17 172 lb (78 kg)  09/03/16 170 lb 12.8 oz (77.5 kg)      ASSESSMENT AND PLAN:  # SCAD:  # Chronic systolic and diastolic heart failure: Ms. Ivy had SCAD of the LAD in 2015.  Follow-up CT scan showed no evidence of coronary dissection and normal coronary flow.  In retrospect this may have been coronary spasm.  Given that her ejection fraction has improved and BNP has been normal in the past, it is hard to understand how heart failure could be causing her symptoms.  Continue Imdur '60mg'$  bid and lasix prn.  Check BMP.  # Fatigue:  It seems there may be a more systemic cause of her symptoms.  We will check an ANA, ESR, TSH, and free T4.  We will also check blood count   # CV Disease Prevention:  LDL 117.  Goal is <130.  Increase exercise.   Current medicines are reviewed at length with the patient today.  The patient does not have concerns regarding medicines.  The following changes have been made: none  Labs/ tests ordered today include:   Orders Placed This Encounter  Procedures  . Sed Rate (ESR)  . ANA Direct w/Reflex if Positive  . T4, free  . TSH  . CBC with Differential/Platelet  . Pro b natriuretic peptide (BNP)  . EKG 12-Lead      Disposition:   FU with Dwayna Kentner C. Oval Linsey, MD, [facility] in 2-3 months.    Signed, Markese Bloxham C. Oval Linsey, MD, [facility]  07/02/2017 5:53 PM    [place]

## 2017-07-02 NOTE — Patient Instructions (Addendum)
Medication Instructions:  STOP ISORDIL   START ISOSORBIDE 60 MG TWICE A DAY   Labwork: ESR/ANA/TSH/FT4/BNP/CBC TODAY   Testing/Procedures: NONE  Follow-Up: Your physician recommends that you schedule a follow-up appointment in: 2-3 MONTHS   If you need a refill on your cardiac medications before your next appointment, please call your pharmacy.

## 2017-07-03 LAB — CBC WITH DIFFERENTIAL/PLATELET
BASOS: 1 %
Basophils Absolute: 0 10*3/uL (ref 0.0–0.2)
EOS (ABSOLUTE): 0.1 10*3/uL (ref 0.0–0.4)
EOS: 1 %
HEMATOCRIT: 39.6 % (ref 34.0–46.6)
HEMOGLOBIN: 13.9 g/dL (ref 11.1–15.9)
IMMATURE GRANULOCYTES: 0 %
Immature Grans (Abs): 0 10*3/uL (ref 0.0–0.1)
Lymphocytes Absolute: 2.1 10*3/uL (ref 0.7–3.1)
Lymphs: 34 %
MCH: 26.6 pg (ref 26.6–33.0)
MCHC: 35.1 g/dL (ref 31.5–35.7)
MCV: 76 fL — AB (ref 79–97)
MONOCYTES: 7 %
MONOS ABS: 0.4 10*3/uL (ref 0.1–0.9)
NEUTROS PCT: 57 %
Neutrophils Absolute: 3.6 10*3/uL (ref 1.4–7.0)
Platelets: 289 10*3/uL (ref 150–379)
RBC: 5.23 x10E6/uL (ref 3.77–5.28)
RDW: 14.5 % (ref 12.3–15.4)
WBC: 6.2 10*3/uL (ref 3.4–10.8)

## 2017-07-03 LAB — ANA W/REFLEX IF POSITIVE: ANA: NEGATIVE

## 2017-07-03 LAB — T4, FREE: Free T4: 1.19 ng/dL (ref 0.82–1.77)

## 2017-07-03 LAB — PRO B NATRIURETIC PEPTIDE: NT-Pro BNP: 36 pg/mL (ref 0–130)

## 2017-07-03 LAB — SEDIMENTATION RATE: Sed Rate: 19 mm/hr (ref 0–32)

## 2017-07-03 LAB — TSH: TSH: 1.32 u[IU]/mL (ref 0.450–4.500)

## 2017-07-05 ENCOUNTER — Encounter: Payer: Self-pay | Admitting: Cardiovascular Disease

## 2017-07-13 ENCOUNTER — Ambulatory Visit: Payer: Commercial Managed Care - PPO | Admitting: Cardiovascular Disease

## 2017-07-26 ENCOUNTER — Telehealth: Payer: Self-pay | Admitting: Cardiovascular Disease

## 2017-07-26 NOTE — Telephone Encounter (Signed)
Spoke with patient and she did wear a monitor a couple of years ago, only PVC's noted. She was given Amlodipine 2.5 mg and felt better on that but blood pressure dropped so it was discontinued. She has palpitations and then gets shortness of breath and coughs. Stated sometimes feels as if heart is quivering. Will discuss with Donnie Aho tomorrow since Dr Oval Linsey out of the office this week.

## 2017-07-26 NOTE — Telephone Encounter (Signed)
Pt c/o Shortness Of Breath: STAT if SOB developed within the last 24 hours or pt is noticeably SOB on the phone  1. Are you currently SOB (can you hear that pt is SOB on the phone)? yes 2. How long have you been experiencing SOB? Since last visit but getting worse 3. Are you SOB when sitting or when up moving around? both 4.  Are you currently experiencing any other symptoms? Palpitations  Pt doesn't think the med is working -pls advise [phone]

## 2017-07-27 NOTE — Telephone Encounter (Signed)
Discussed with Donnie Aho PA and she recommended 48 hour holter of Event  Monitor. Called and advised patient. She said she would call insurance tomorrow and see if they will pay for either of them. She did start back the Amlodipine 2.5 mg daily yesterday which she took before and it helped. She stated she took yesterday and today and is doing much better. She is monitoring her blood pressure at home.

## 2017-10-06 ENCOUNTER — Ambulatory Visit: Payer: Commercial Managed Care - PPO | Admitting: Cardiovascular Disease

## 2018-01-26 ENCOUNTER — Other Ambulatory Visit: Payer: Self-pay | Admitting: Cardiovascular Disease

## 2018-01-26 NOTE — Telephone Encounter (Signed)
°*  STAT* If patient is at the pharmacy, call can be transferred to refill team.   1. Which medications need to be refilled? (please list name of each medication and dose if known) new prescription for Nitroglycerin  2. Which pharmacy/location (including street and city if local pharmacy) is [phone]  3. Do they need a 30 day or 90 day supply? 1 bottle

## 2018-02-04 ENCOUNTER — Telehealth: Payer: Self-pay

## 2018-02-04 NOTE — Telephone Encounter (Signed)
° °  Patient calling to request response to message. Patient states she has increased dosage of medication herself. Please call

## 2018-02-04 NOTE — Telephone Encounter (Signed)
Spoke with patient and she actually increased her Imdur 60 mg 1 and 1/2 in the morning and 1 in the evening a few days ago and is feeling better already. Will forward to Dr Oval Linsey for review

## 2018-02-04 NOTE — Telephone Encounter (Signed)
Good Afternoon Dr. Oval Linsey,     I think it's time for another increase in the Imdur. I don't have any new symptoms, just the same patterns as the past.... after a few months of taking a consistent dose I have to use Nitro more often for chest pain, upper back pain and pressure to get through my day. Shortness of breath and swelling more often that requires Lasix. This has been consistent for the past 2 weeks.     I am due a refill in the next week, so I will wait to hear from you before submitting it.     Thanks  [name]  I returned call to pt she states that she has already used up all of her appts for this year for Dr Oval Linsey so she will have to pay out of pocket. Can she come in "next year" she would like to talk to [place] and discuss

## 2018-02-08 NOTE — Telephone Encounter (Signed)
OK that is fine

## 2018-02-14 MED ORDER — ISOSORBIDE MONONITRATE ER 60 MG PO TB24: ORAL_TABLET | ORAL | 5 refills | Status: DC

## 2018-02-14 NOTE — Telephone Encounter (Signed)
Pt aware of Dr.[place]'s response. Pt sts that she will need an update Rx sent to pt perferred pharmacy-  Done. Adv pt that she is due for f/u with Dr.Chili. appt scheduled for 04/28/18 @ 9am. Pt aware of appt and verbalized appreciation for the assistance.

## 2018-02-20 ENCOUNTER — Other Ambulatory Visit: Payer: Self-pay | Admitting: Cardiovascular Disease

## 2018-04-28 ENCOUNTER — Encounter: Payer: Self-pay | Admitting: Cardiovascular Disease

## 2018-04-28 ENCOUNTER — Ambulatory Visit (INDEPENDENT_AMBULATORY_CARE_PROVIDER_SITE_OTHER): Payer: Commercial Managed Care - PPO | Admitting: Cardiovascular Disease

## 2018-04-28 ENCOUNTER — Other Ambulatory Visit: Payer: Self-pay

## 2018-04-28 ENCOUNTER — Other Ambulatory Visit: Payer: Self-pay | Admitting: Cardiovascular Disease

## 2018-04-28 VITALS — BP 126/80 | HR 69 | Ht 59.0 in | Wt 177.0 lb

## 2018-04-28 DIAGNOSIS — I2542 Coronary artery dissection: Secondary | ICD-10-CM | POA: Diagnosis not present

## 2018-04-28 DIAGNOSIS — R0602 Shortness of breath: Secondary | ICD-10-CM

## 2018-04-28 LAB — BASIC METABOLIC PANEL
BUN/Creatinine Ratio: 11 (ref 9–23)
BUN: 9 mg/dL (ref 6–24)
CHLORIDE: 105 mmol/L (ref 96–106)
CO2: 21 mmol/L (ref 20–29)
Calcium: 9.3 mg/dL (ref 8.7–10.2)
Creatinine, Ser: 0.83 mg/dL (ref 0.57–1.00)
GFR calc non Af Amer: 85 mL/min/{1.73_m2} (ref >59)
GFR, EST AFRICAN AMERICAN: 98 mL/min/{1.73_m2} (ref >59)
Glucose: 99 mg/dL (ref 65–99)
Potassium: 4.4 mmol/L (ref 3.5–5.2)
Sodium: 140 mmol/L (ref 134–144)

## 2018-04-28 MED ORDER — RANOLAZINE ER 500 MG PO TB12: 500.0000 mg | ORAL_TABLET | Freq: Two times a day (BID) | ORAL | 5 refills | Status: DC

## 2018-04-28 NOTE — Patient Instructions (Signed)
Medication Instructions:  OK TO USE FUROSEMIDE (LASIX) DAILY   START RANEXA 500 MG TWICE A DAY   If you need a refill on your cardiac medications before your next appointment, please call your pharmacy.   Lab work: BMET TODAY   Testing/Procedures: Your physician has requested that you have an echocardiogram. Echocardiography is a painless test that uses sound waves to create images of your heart. It provides your doctor with information about the size and shape of your heart and how well your heart's chambers and valves are working. This procedure takes approximately one hour. There are no restrictions for this procedure. IN [place]   Follow-Up: Your physician recommends that you schedule a follow-up appointment in: 3 MONTHS WITH PA/NP

## 2018-04-29 ENCOUNTER — Telehealth: Payer: Self-pay | Admitting: *Deleted

## 2018-04-29 NOTE — Telephone Encounter (Signed)
Left message for patient to call and schedule Echo ordered by Dr. Oval Linsey

## 2018-05-01 ENCOUNTER — Encounter: Payer: Self-pay | Admitting: Cardiovascular Disease

## 2018-05-01 NOTE — Progress Notes (Signed)
Cardiology Office Note   Date:  05/05/2018   ID:  [name], DOB [birth year], MRN [id number]  PCP:  Wendie Agreste, MD  Cardiologist:   Skeet Latch, MD   No chief complaint on file.    History of Present Illness: [name] is a 48 y.o. female with SCAD status post myocardial infarction who presents for follow up.  She had presumed SCAD of the LAD in 2015.  This was managed medically. LVEF was 35% by cath. She had a cardiac CT scan that subsequently did not show any dissection of the left main or left anterior descending. It noted subpleural pulmonary nodules and cholelithiasis. Coronary calcium score was 0.  A follow-up CT scan was recommended but this was not obtained due to cost. She had a subsequent echocardiogram 08/2013 that revealed LVEF 50-55% with akinesis of the apical myocardium and normal diastolic function. Follow-up echo 01/2014 revealed LVEF 45-50% with apical akinesis, grade 2 diastolic dysfunction.  She was previously a patient of Dr. Irish Lack but switched to Dr. Debara Pickett in 2016.  She last saw Dr. Debara Pickett on 05/17/15 and reported chest pain and palpitations that occurred randomly.  She felt that they might be related to her menstrual cycles. Her EKG was unremarkable at that time. She has been treated with aspirin and metoprolol but developed dizziness with metoprolol. It was felt that the symptoms were unlikely to be related to recurrent coronary artery dissection and either calcium channel blocker or long-acting nitrates were recommended due to possible coronary spasm. She elected to switch providers again. She followed up with [name] October and November 2017 with a complaint of shortness of breath. A repeat echocardiogram was ordered but has not been performed. She was started on amlodipine 2.5 mg with improvement in her symptoms.   [name] continues to report chest pain and symptoms of heart failure.  There is no evidence of volume overload and BNP 07/2016 was  62. She was given Lasix to take as needed and instructed not to take it unless she had edema , weight gain. She also takes Imdur for possible coronary spasms. She was referred for an echocardiogram 09/01/16 that revealed LVEF 55-60% and normal diastolic function. She continues to have extreme fatigue after exercise.  She feels OK with exercise but afterwards she feels like she is drained more than she should be.  She still has intermittent episodes of chest pain. Lately she has been feeling volume overloaded.  She notes that her abdomen is distended and she has edema.  She has no orthopnea or PND.   Past Medical History:  Diagnosis Date  . Acid reflux   . Anxiety   . Asthmatic bronchitis    a. Typically when the weather changes.  . Cholelithiasis   . Coronary artery dissection    a. s/p LHC 08/17/13 SCAD which likely originated in the mid LAD and propagated retrograde back to the ostium of the left main. Treated medically.   . H/O varicella   . Herniated disc, cervical   . Hypotension    a. prev taken off BB due to BP 01V-49S systolic.  . Ischemic cardiomyopathy    a. EF 35%, LV apical hypokinesia by cath 08/17/13 - f/u echo 08/2013 EF 50-55%.  . Lung nodule    a. incidental finding on CT 08/2013. Needs follow up in 08/2014  . Menstrual migraine   . Microcytosis    a. Low ferritin 11/2013.  . Monilial vaginitis 2005  . Neuromuscular  disorder ([place])   . Obesity, Class II, BMI 35-39.9   . Ovarian cyst, left 2012  . PMS (premenstrual syndrome)   . PVC's (premature ventricular contractions)     Past Surgical History:  Procedure Laterality Date  . COMBINED HYSTEROSCOPY DIAGNOSTIC / D&C  01/15/2015   [facility], Dr. Cletis Media  . HYSTEROSCOPY W/ ENDOMETRIAL ABLATION  01/15/2015   [facility], Dr. Cletis Media  . LAPAROSCOPIC TUBAL LIGATION  02/20/2011   Procedure: LAPAROSCOPIC TUBAL LIGATION;  Surgeon: Alwyn Pea, MD;  Location: [facility];  Service: Gynecology;  Laterality: Bilateral;  with Removal of Mirena  and Robotic Assisted Bilateral Tubal Ligation  . LEFT HEART CATHETERIZATION WITH CORONARY ANGIOGRAM Bilateral 08/17/2013   Procedure: LEFT HEART CATHETERIZATION WITH CORONARY ANGIOGRAM;  Surgeon: Jettie Booze, MD;  Location: [facility] CATH LAB;  Service: Cardiovascular;  Laterality: Bilateral;  . ROBOTIC ASSISTED LAPAROSCOPIC OVARIAN CYSTECTOMY  02/2011  . TUBAL LIGATION  02/20/2011  . WISDOM TOOTH EXTRACTION       Current Outpatient Medications  Medication Sig Dispense Refill  . aspirin 81 MG tablet Take 1 tablet (81 mg total) by mouth daily. 30 tablet 0  . furosemide (LASIX) 20 MG tablet Take 20 mg by mouth daily.    . isosorbide mononitrate (IMDUR) 60 MG 24 hr tablet Take 1 and 1/2 Tablet (27m) in the morning and 1 tablet (655m in the evening. 75 tablet 5  . Multiple Vitamin (MULTIVITAMIN) tablet Take 1 tablet by mouth daily.    . nitroGLYCERIN (NITROSTAT) 0.4 MG SL tablet PLACE 1 TABLET UNDER THE TONGUE EVERY 5 MINUTES UP TO 3 DOSES AS NEEDED FOR CHEST PAIN 25 tablet 4  . ranolazine (RANEXA) 500 MG 12 hr tablet Take 1 tablet (500 mg total) by mouth 2 (two) times daily. 60 tablet 5   No current facility-administered medications for this visit.     Allergies:   Vicodin [hydrocodone-acetaminophen]    Social History:  The patient  reports that she has never smoked. She has never used smokeless tobacco. She reports that she does not drink alcohol or use drugs.   Family History:  The patient's family history includes Breast cancer (age of onset: 7042in her paternal aunt; Diabetes in her father; Heart disease in her father and paternal grandmother; Hypertension in her brother, brother, and father; Uterine cancer in her mother.    ROS:  Please see the history of present illness.   Otherwise, review of systems are positive for none.   All other systems are reviewed and negative.    PHYSICAL EXAM: VS:  BP 126/80   Pulse 69   Ht _0  (1.499 m)   Wt 177 lb (80.3 kg)   BMI 35.75 kg/m  ,  BMI Body mass index is 35.75 kg/m. GENERAL:  Well appearing HEENT: Pupils equal round and reactive, fundi not visualized, oral mucosa unremarkable NECK:  No jugular venous distention, waveform within normal limits, carotid upstroke brisk and symmetric, no bruits LUNGS:  Clear to auscultation bilaterally HEART:  RRR.  PMI not displaced or sustained,S1 and S2 within normal limits, no S3, no S4, no clicks, no rubs, no murmurs ABD:  Flat, positive bowel sounds normal in frequency in pitch, no bruits, no rebound, no guarding, no midline pulsatile mass, no hepatomegaly, no splenomegaly EXT:  2 plus pulses throughout, no edema, no cyanosis no clubbing SKIN:  No rashes no nodules NEURO:  Cranial nerves II through XII grossly intact, motor grossly intact throughout PSYCH:  Cognitively intact, oriented to person  place and time   EKG:  EKG is not ordered today. 07/17/16: Sinus rhythm.  Rate 84 bpm.  07/02/17: Sinus rhythm.  Rate 84 bpm. 04/28/18: Sinus rhythm.  Rate 69 bpm,  Echo 09/01/16: Study Conclusions  - Left ventricle: The cavity size was normal. Wall thickness was at   the upper limits of normal. Systolic function was normal. The   estimated ejection fraction was in the range of 55% to 60%. Wall   motion was normal; there were no regional wall motion   abnormalities. Left ventricular diastolic function parameters   were normal. - Right ventricle: The cavity size was normal. Systolic function   was normal.   Impressions:  - Normal study. Previously noted apical wall motion abnormalities   is not evident on today&'s study.  Recent Labs: 07/02/2017: Hemoglobin 13.9; NT-Pro BNP 36; Platelets 289; TSH 1.320 04/28/2018: BUN 9; Creatinine, Ser 0.83; Potassium 4.4; Sodium 140    Lipid Panel    Component Value Date/Time   CHOL 197 07/08/2016 0958   TRIG 89 07/08/2016 0958   HDL 62 07/08/2016 0958   CHOLHDL 3.2 07/08/2016 0958   CHOLHDL 2.7 08/18/2013 0330   VLDL 18 08/18/2013 0330    LDLCALC 117 (H) 07/08/2016 0958      Wt Readings from Last 3 Encounters:  04/28/18 177 lb (80.3 kg)  07/02/17 170 lb 9.6 oz (77.4 kg)  04/01/17 172 lb (78 kg)      ASSESSMENT AND PLAN:  # SCAD:  # Chronic systolic and diastolic heart failure: Ms. Leasure had SCAD of the LAD in 2015.  Follow-up CT scan showed no evidence of coronary dissection and normal coronary flow.  In retrospect this may have been coronary spasm.  Given that her ejection fraction has improved and BNP has been normal in the past, it is hard to understand how heart failure could be causing her symptoms now.  However, she continues to report volume overload.  She appears to be euvolemic on exam.  Continue Imdur 17m bid and lasix prn.  It is OK for her to take lasix daily if needed. Check BMP.  She wants to try Ranexa to see if it will help her pain.  We will be 505mbid.  # Fatigue:  Doesn't seem to be cardiac related.  She was encouraged to keep exercising.  Thyroid function and ESR were normal.  # CV Disease Prevention:  LDL 117.  Goal is <130.  Increase exercise.   Current medicines are reviewed at length with the patient today.  The patient does not have concerns regarding medicines.  The following changes have been made: none  Labs/ tests ordered today include:   Orders Placed This Encounter  Procedures  . EKG 12-Lead  . ECHOCARDIOGRAM COMPLETE      Disposition:   FU with Henry Demeritt C. RaOval LinseyMD, FAMackin[facility] LLCn 2-3 months.    Signed, Karlos Scadden C. RaOval LinseyMD, FAChapman Medical Center3/19/2020 6:[facility]

## 2018-05-02 NOTE — Telephone Encounter (Signed)
Left message for patient to call and schedule Echo and 3 mos appt with PA

## 2018-05-05 ENCOUNTER — Encounter: Payer: Self-pay | Admitting: Cardiovascular Disease

## 2018-08-03 ENCOUNTER — Other Ambulatory Visit: Payer: Self-pay | Admitting: Cardiovascular Disease

## 2018-09-17 ENCOUNTER — Other Ambulatory Visit: Payer: Self-pay | Admitting: Cardiovascular Disease

## 2018-10-02 ENCOUNTER — Other Ambulatory Visit: Payer: Self-pay | Admitting: Cardiovascular Disease

## 2018-10-29 ENCOUNTER — Other Ambulatory Visit: Payer: Self-pay | Admitting: Cardiovascular Disease

## 2018-10-31 ENCOUNTER — Telehealth: Payer: Self-pay | Admitting: Cardiovascular Disease

## 2018-10-31 NOTE — Telephone Encounter (Signed)
New message   Patient wants a call back to discuss going up on dosage of some of her medications. Please call.

## 2018-10-31 NOTE — Telephone Encounter (Signed)
Spoke with patient and she is having the same symptoms she has had in the past. She stated she is doing much better and Ranexa has been great. Per patient around 5 pm she starts having tightness and back pain, using more NTG in the evening. Will forward to Dr Oval Linsey for review. Patient did not have her Echo and follow up because she was doing so much better on the Ranexa Will forward to Dr Oval Linsey for review

## 2018-11-03 NOTE — Telephone Encounter (Signed)
Spoke with patient and she was hoping to increase her Ranexa if possible since this seems to have helped her the most. Also she does not really want to get another Echo since they have not shown anything in the past and she does not have the extra money (at least $800 out of pocket). Will forward to Dr Oval Linsey for review

## 2018-11-03 NOTE — Telephone Encounter (Signed)
Take extra 30mg  of Imdur as needed.  Go ahead and get the echo.

## 2018-11-04 NOTE — Telephone Encounter (Signed)
1000mg  bid is fine

## 2018-11-07 MED ORDER — RANOLAZINE ER 1000 MG PO TB12: 1000.0000 mg | ORAL_TABLET | Freq: Two times a day (BID) | ORAL | 3 refills | Status: DC

## 2018-11-07 NOTE — Telephone Encounter (Signed)
Advised patient, verbalized understanding  

## 2018-11-08 NOTE — Telephone Encounter (Signed)
Advised ok to hold off pm Echo for now per Dr Oval Linsey

## 2018-11-08 NOTE — Telephone Encounter (Signed)
yes

## 2018-12-15 ENCOUNTER — Telehealth: Payer: Commercial Managed Care - PPO | Admitting: Physician Assistant

## 2018-12-15 DIAGNOSIS — H538 Other visual disturbances: Secondary | ICD-10-CM

## 2018-12-15 NOTE — Progress Notes (Signed)
Based on what you shared with me, I feel your condition warrants further evaluation and I recommend that you be seen for a face to face office visit.  Giving mention of blurry vision, you will need to be seen in person for assessment as vision changes can be symptoms have a worrisome underlying issue. This is not something that we can diagnose or treat via e-visit  NOTE: If you entered your credit card information for this eVisit, you will not be charged. You may see a "hold" on your card for the $35 but that hold will drop off and you will not have a charge processed.  If you are having a true medical emergency please call 911.     For an urgent face to face visit, [facility] has four urgent care centers for your convenience:   . [facility]    [phone]                  Get Driving Directions  [id number] [place], [place] [address] . 10 am to 8 pm Monday-Friday . 12 pm to 8 pm Saturday-Sunday   . [facility] at [place]                  Get Driving Directions  [id number] [place], [place], [place] [address] . 8 am to 8 pm Monday-Friday . 9 am to 6 pm Saturday . 11 am to 6 pm Sunday   . [facility] at [place]                  Get Driving Directions   [address], [place] [address] . 8 am to 8 pm Monday-Friday . 8 am to 4 pm Saturday-Sunday    . [facility] at [place]                    Get Driving Directions  [id number]  [address] [place],  [address]  . Monday-Friday, 12 PM to 6 PM    Your e-visit answers were reviewed by a board certified advanced clinical practitioner to complete your personal care plan.  Thank you for using e-Visits.

## 2018-12-16 ENCOUNTER — Other Ambulatory Visit: Payer: Self-pay

## 2018-12-16 DIAGNOSIS — Z20822 Contact with and (suspected) exposure to covid-19: Secondary | ICD-10-CM

## 2018-12-18 LAB — NOVEL CORONAVIRUS, NAA: SARS-CoV-2, NAA: NOT DETECTED

## 2019-03-09 ENCOUNTER — Other Ambulatory Visit: Payer: Self-pay | Admitting: Cardiovascular Disease

## 2019-03-19 ENCOUNTER — Other Ambulatory Visit: Payer: Self-pay | Admitting: Cardiovascular Disease

## 2019-05-18 ENCOUNTER — Other Ambulatory Visit: Payer: Self-pay | Admitting: Cardiovascular Disease

## 2019-07-04 ENCOUNTER — Telehealth: Payer: Self-pay | Admitting: Cardiovascular Disease

## 2019-07-04 NOTE — Telephone Encounter (Signed)
Spoke with pt who report for about a month she had been experiencing sob, back pain, and fatigue. She also report some occasional chest tightness. Pt state she noticed she was having to use nitro more often and symptoms were similar to when she had to increase Imdur dose. Pt state last Thursday she increased her dose to 2 tablets in the morning and 1 at night. She report symptoms has subsided and she feels better, but wanted to make MD aware.  Pt has an appointment scheduled for 8/5. Based on chart review, on 04/28/18 MD recommended  f/u in 2-3 months. Nurse offered to scheduled a sooner appointment to reevaluate due to recent symptoms and self-dosing. Pt voiced she doesn't feel symptoms were urgent and ok with keeping Aug appointment. Nurse informed a message would be route to MD for her recommendations. Pt verbalized understanding and state she would do whatever MD recommended.

## 2019-07-04 NOTE — Telephone Encounter (Signed)
Pt c/o medication issue:  1. Name of Medication: isosorbide mononitrate (IMDUR) 60 MG 24 hr tablet  2. How are you currently taking this medication (dosage and times per day)? 1 and a half tablets in afternoon and 1 tablet at night  3. Are you having a reaction (difficulty breathing--STAT)? no  4. What is your medication issue? Patient states her body has gotten used to the medication and would like to increase the dosage to 2 tablets in the afternoon and 1 at night. Please advise.

## 2019-07-10 NOTE — Telephone Encounter (Signed)
If she is doing OK with that regimen we can keep the August appointment.

## 2019-07-11 MED ORDER — ISOSORBIDE MONONITRATE ER 60 MG PO TB24: ORAL_TABLET | ORAL | 3 refills | Status: DC

## 2019-07-11 NOTE — Telephone Encounter (Signed)
Spoke with pt, aware of dr Blenda Mounts recommendations. New script sent to the pharmacy

## 2019-07-11 NOTE — Telephone Encounter (Signed)
Left message to call back  

## 2019-07-11 NOTE — Telephone Encounter (Signed)
Follow up  Pt is returning [name]'s call

## 2019-09-14 ENCOUNTER — Other Ambulatory Visit: Payer: Self-pay | Admitting: Cardiovascular Disease

## 2019-09-21 ENCOUNTER — Other Ambulatory Visit: Payer: Self-pay

## 2019-09-21 ENCOUNTER — Ambulatory Visit (INDEPENDENT_AMBULATORY_CARE_PROVIDER_SITE_OTHER): Payer: Commercial Managed Care - PPO | Admitting: Cardiovascular Disease

## 2019-09-21 ENCOUNTER — Encounter: Payer: Self-pay | Admitting: Cardiovascular Disease

## 2019-09-21 VITALS — BP 120/78 | HR 72 | Ht <= 58 in | Wt 174.0 lb

## 2019-09-21 DIAGNOSIS — I493 Ventricular premature depolarization: Secondary | ICD-10-CM | POA: Diagnosis not present

## 2019-09-21 DIAGNOSIS — E785 Hyperlipidemia, unspecified: Secondary | ICD-10-CM

## 2019-09-21 DIAGNOSIS — E669 Obesity, unspecified: Secondary | ICD-10-CM | POA: Diagnosis not present

## 2019-09-21 DIAGNOSIS — I2542 Coronary artery dissection: Secondary | ICD-10-CM | POA: Diagnosis not present

## 2019-09-21 DIAGNOSIS — R0602 Shortness of breath: Secondary | ICD-10-CM

## 2019-09-21 NOTE — Progress Notes (Signed)
Cardiology Office Note  Date:  10/23/2019   ID:  [name], DOB November 03, 1970, MRN [id number]  PCP:  Wendie Agreste, MD  Cardiologist:   Skeet Latch, MD   No chief complaint on file.    History of Present Illness: [name] is a 49 y.o. female with SCAD status post myocardial infarction who presents for follow up.  She had presumed SCAD of the LAD in 2015.  This was managed medically. LVEF was 35% by cath. She had a cardiac CT scan that subsequently did not show any dissection of the left main or left anterior descending. It noted subpleural pulmonary nodules and cholelithiasis. Coronary calcium score was 0.  A follow-up CT scan was recommended but this was not obtained due to cost. She had a subsequent echocardiogram 08/2013 that revealed LVEF 50-55% with akinesis of the apical myocardium and normal diastolic function. Follow-up echo 01/2014 revealed LVEF 45-50% with apical akinesis, grade 2 diastolic dysfunction.  She was previously a patient of Dr. Irish Lack but switched to Dr. Debara Pickett in 2016.  She last saw Dr. Debara Pickett on 05/17/15 and reported chest pain and palpitations that occurred randomly.  She felt that they might be related to her menstrual cycles. Her EKG was unremarkable at that time. She has been treated with aspirin and metoprolol but developed dizziness with metoprolol. It was felt that the symptoms were unlikely to be related to recurrent coronary artery dissection and either calcium channel blocker or long-acting nitrates were recommended due to possible coronary spasm. She elected to switch providers again. She followed up with [name] October and November 2017 with a complaint of shortness of breath. A repeat echocardiogram was ordered but has not been performed. She was started on amlodipine 2.5 mg with improvement in her symptoms.   [name] continued to report chest pain and symptoms of heart failure.  There was no evidence of volume overload and BNP 07/2016 was  62. She was given Lasix to take as needed and instructed not to take it unless she had edema , weight gain. She also takes Imdur for possible coronary spasms. She was referred for an echocardiogram 09/01/16 that revealed LVEF 55-60% and normal diastolic function.  She continues to have back pain and whole body fatigue in the evening.  She gets very fatigued and tired.  She feels bloated.  It occurs with exertion or mental stress.  If she does these things in the AM she is OK.  If she takes nitroglycerin and rests it seems to get better.  She exercises by walking or riding her bike a couple times per week.  She does not typically have the symptoms while exerting herself.  However the next day or 2 she gets very fatigued.  She notes more spasms and fatigue the week before her menstrual cycles.  Her diet has been very good.  The Ranexa seems to have helped her chest pain.  She has not had any lower extremity edema, orthopnea, or PND.   Past Medical History:  Diagnosis Date   Acid reflux    Anxiety    Asthmatic bronchitis    a. Typically when the weather changes.   Cholelithiasis    Coronary artery dissection    a. s/p LHC 08/17/13 SCAD which likely originated in the mid LAD and propagated retrograde back to the ostium of the left main. Treated medically.    H/O varicella    Herniated disc, cervical    Hypotension    a.  prev taken off BB due to BP 67R-91M systolic.   Ischemic cardiomyopathy    a. EF 35%, LV apical hypokinesia by cath 08/17/13 - f/u echo 08/2013 EF 50-55%.   Lung nodule    a. incidental finding on CT 08/2013. Needs follow up in 08/2014   Menstrual migraine    Microcytosis    a. Low ferritin 11/2013.   Monilial vaginitis 2005   Neuromuscular disorder (HCC)    Obesity, Class II, BMI 35-39.9    Ovarian cyst, left 2012   PMS (premenstrual syndrome)    PVC's (premature ventricular contractions)     Past Surgical History:  Procedure Laterality Date   COMBINED  HYSTEROSCOPY DIAGNOSTIC / D&C  01/15/2015   CCOBGYN, Dr. Cletis Media   HYSTEROSCOPY W/ ENDOMETRIAL ABLATION  01/15/2015   CCOBGYN, Dr. Cletis Media   LAPAROSCOPIC TUBAL LIGATION  02/20/2011   Procedure: LAPAROSCOPIC TUBAL LIGATION;  Surgeon: Alwyn Pea, MD;  Location: [facility];  Service: Gynecology;  Laterality: Bilateral;  with Removal of Mirena and Robotic Assisted Bilateral Tubal Ligation   LEFT HEART CATHETERIZATION WITH CORONARY ANGIOGRAM Bilateral 08/17/2013   Procedure: LEFT HEART CATHETERIZATION WITH CORONARY ANGIOGRAM;  Surgeon: Jettie Booze, MD;  Location: [facility] CATH LAB;  Service: Cardiovascular;  Laterality: Bilateral;   ROBOTIC ASSISTED LAPAROSCOPIC OVARIAN CYSTECTOMY  02/2011   TUBAL LIGATION  02/20/2011   WISDOM TOOTH EXTRACTION       Current Outpatient Medications  Medication Sig Dispense Refill   aspirin 81 MG tablet Take 1 tablet (81 mg total) by mouth daily. 30 tablet 0   furosemide (LASIX) 20 MG tablet TAKE 1 TABLET BY MOUTH DAILY FOR 3 DAYS AND THEN AS NEEDED FOR SWELLING/SOB 30 tablet 6   isosorbide mononitrate (IMDUR) 60 MG 24 hr tablet take 2 tablets in the moring and 1 tablet in the evening 270 tablet 3   Multiple Vitamin (MULTIVITAMIN) tablet Take 1 tablet by mouth daily.     nitroGLYCERIN (NITROSTAT) 0.4 MG SL tablet PLACE 1 TABLET UNDER THE TONGUE EVERY 5 MINUTES AS NEEDED FOR CHEST PAIN. MAX 3 DOSES 25 tablet 0   ranolazine (RANEXA) 1000 MG SR tablet Take 1 tablet (1,000 mg total) by mouth 2 (two) times daily. 180 tablet 3   rosuvastatin (CRESTOR) 20 MG tablet Take 1 tablet (20 mg total) by mouth daily. 90 tablet 3   No current facility-administered medications for this visit.    Allergies:   Zocor [simvastatin] and Vicodin [hydrocodone-acetaminophen]    Social History:  The patient  reports that she has never smoked. She has never used smokeless tobacco. She reports that she does not drink alcohol and does not use drugs.   Family History:  The patient's  family history includes Breast cancer (age of onset: 71) in her paternal aunt; Diabetes in her father; Heart disease in her father and paternal grandmother; Hypertension in her brother, brother, and father; Uterine cancer in her mother.    ROS:  Please see the history of present illness.   Otherwise, review of systems are positive for none.   All other systems are reviewed and negative.    PHYSICAL EXAM: VS:  BP 120/78    Pulse 72    Ht '4\' 10"'$  (1.473 m)    Wt 174 lb (78.9 kg)    SpO2 99%    BMI 36.37 kg/m  , BMI Body mass index is 36.37 kg/m. GENERAL:  Well appearing HEENT: Pupils equal round and reactive, fundi not visualized, oral mucosa unremarkable NECK:  No jugular venous distention, waveform within normal limits, carotid upstroke brisk and symmetric, no bruits LUNGS:  Clear to auscultation bilaterally HEART:  RRR.  PMI not displaced or sustained,S1 and S2 within normal limits, no S3, no S4, no clicks, no rubs, no murmurs ABD:  Flat, positive bowel sounds normal in frequency in pitch, no bruits, no rebound, no guarding, no midline pulsatile mass, no hepatomegaly, no splenomegaly EXT:  2 plus pulses throughout, no edema, no cyanosis no clubbing SKIN:  No rashes no nodules NEURO:  Cranial nerves II through XII grossly intact, motor grossly intact throughout PSYCH:  Cognitively intact, oriented to person place and time   EKG:  EKG is ordered today. 07/17/16: Sinus rhythm.  Rate 84 bpm.  07/02/17: Sinus rhythm.  Rate 84 bpm. 04/28/18: Sinus rhythm.  Rate 69 bpm 09/21/19: Sinus rhythm.  Rte 72 bpm.    Echo 09/01/16: Study Conclusions  - Left ventricle: The cavity size was normal. Wall thickness was at   the upper limits of normal. Systolic function was normal. The   estimated ejection fraction was in the range of 55% to 60%. Wall   motion was normal; there were no regional wall motion   abnormalities. Left ventricular diastolic function parameters   were normal. - Right ventricle: The  cavity size was normal. Systolic function   was normal.   Impressions:  - Normal study. Previously noted apical wall motion abnormalities   is not evident on today&'s study.  Recent Labs: 09/21/2019: ALT 13; BUN 12; Creatinine, Ser 0.91; Potassium 4.7; Sodium 144    Lipid Panel    Component Value Date/Time   CHOL 263 (H) 09/21/2019 1052   TRIG 91 09/21/2019 1052   HDL 75 09/21/2019 1052   CHOLHDL 3.5 09/21/2019 1052   CHOLHDL 2.7 08/18/2013 0330   VLDL 18 08/18/2013 0330   LDLCALC 173 (H) 09/21/2019 1052      Wt Readings from Last 3 Encounters:  09/21/19 174 lb (78.9 kg)  04/28/18 177 lb (80.3 kg)  07/02/17 170 lb 9.6 oz (77.4 kg)      ASSESSMENT AND PLAN:  # SCAD:  # Chronic systolic and diastolic heart failure: [name] had SCAD of the LAD in 2015.  Follow-up CT scan showed no evidence of coronary dissection and normal coronary flow.  In retrospect this may have been coronary spasm.  Given that her ejection fraction has improved and BNP has been normal in the past, it is hard to understand how CAD or heart failure could be causing her symptoms now.  However, she continues to report volume overload.  She appears to be euvolemic on exam.  Continue Imdur '60mg'$  bid and lasix prn. Given her persistent anginal symptoms we will get a coronary CT-A if she is able to get it approved via insurance.   It is OK for her to take lasix daily if needed. Check BMP.  Continue aspirin, Imdur, Ranexa and rosuvastatin.  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 Consider functional testing.   # Fatigue:  Doesn't seem to be cardiac related.  She was encouraged to keep exercising.  Thyroid function and ESR were normal.  # CV Disease Prevention:  LDL 173.  Start rosuvastatin.    Current medicines are reviewed at length with the patient today.  The patient does not have concerns regarding medicines.  The following changes have been made: none  Labs/ tests ordered today include:    Orders Placed This Encounter  Procedures   Lipid panel  Comprehensive metabolic panel   EKG 21-YYQM   Time spent: 40 minutes-Greater than 50% of this time was spent in counseling, explanation of diagnosis, planning of further management, and coordination of care.     Disposition:   FU with Jassiel Flye C. Oval Linsey, MD, [facility] in 6 months.    Signed, Melvern Ramone C. Oval Linsey, MD, [facility]  10/23/2019 11:00 AM    [place]

## 2019-09-21 NOTE — Patient Instructions (Addendum)
Medication Instructions:  Your physician recommends that you continue on your current medications as directed. Please refer to the Current Medication list given to you today.  *If you need a refill on your cardiac medications before your next appointment, please call your pharmacy*  Lab Work: LP/CMET TODAY   If you have labs (blood work) drawn today and your tests are completely normal, you will receive your results only by: Marland Kitchen MyChart Message (if you have MyChart) OR . A paper copy in the mail If you have any lab test that is abnormal or we need to change your treatment, we will call you to review the results.  Testing/Procedures: NONE   Follow-Up: At [facility], you and your health needs are our priority.  As part of our continuing mission to provide you with exceptional heart care, we have created designated Provider Care Teams.  These Care Teams include your primary Cardiologist (physician) and Advanced Practice Providers (APPs -  Physician Assistants and Nurse Practitioners) who all work together to provide you with the care you need, when you need it.  We recommend signing up for the patient portal called "MyChart".  Sign up information is provided on this After Visit Summary.  MyChart is used to connect with patients for Virtual Visits (Telemedicine).  Patients are able to view lab/test results, encounter notes, upcoming appointments, etc.  Non-urgent messages can be sent to your provider as well.   To learn more about what you can do with MyChart, go to [email or web address].    Your next appointment:   6 month(s)   You will receive a reminder letter in the mail two months in advance. If you don't receive a letter, please call our office to schedule the follow-up appointment.  The format for your next appointment:   In Person  Provider:   You may see DR [facility]  or one of the following Advanced Practice Providers on your designated Care Team:    Kerin Ransom,  PA-C  [place], [place]  Coletta Memos, [place]  Other Instructions  Please check with your insurance about getting a coronary CT-A.

## 2019-09-22 LAB — LIPID PANEL
Chol/HDL Ratio: 3.5 ratio (ref 0.0–4.4)
Cholesterol, Total: 263 mg/dL — ABNORMAL HIGH (ref 100–199)
HDL: 75 mg/dL (ref >39)
LDL Chol Calc (NIH): 173 mg/dL — ABNORMAL HIGH (ref 0–99)
Triglycerides: 91 mg/dL (ref 0–149)
VLDL Cholesterol Cal: 15 mg/dL (ref 5–40)

## 2019-09-22 LAB — COMPREHENSIVE METABOLIC PANEL
ALT: 13 IU/L (ref 0–32)
AST: 12 IU/L (ref 0–40)
Albumin/Globulin Ratio: 2 (ref 1.2–2.2)
Albumin: 4.4 g/dL (ref 3.8–4.8)
Alkaline Phosphatase: 36 IU/L — ABNORMAL LOW (ref 48–121)
BUN/Creatinine Ratio: 13 (ref 9–23)
BUN: 12 mg/dL (ref 6–24)
Bilirubin Total: 0.4 mg/dL (ref 0.0–1.2)
CO2: 24 mmol/L (ref 20–29)
Calcium: 10.8 mg/dL — ABNORMAL HIGH (ref 8.7–10.2)
Chloride: 104 mmol/L (ref 96–106)
Creatinine, Ser: 0.91 mg/dL (ref 0.57–1.00)
GFR calc Af Amer: 88 mL/min/{1.73_m2} (ref >59)
GFR calc non Af Amer: 76 mL/min/{1.73_m2} (ref >59)
Globulin, Total: 2.2 g/dL (ref 1.5–4.5)
Glucose: 100 mg/dL — ABNORMAL HIGH (ref 65–99)
Potassium: 4.7 mmol/L (ref 3.5–5.2)
Sodium: 144 mmol/L (ref 134–144)
Total Protein: 6.6 g/dL (ref 6.0–8.5)

## 2019-09-26 ENCOUNTER — Telehealth: Payer: Self-pay | Admitting: *Deleted

## 2019-09-26 DIAGNOSIS — Z5181 Encounter for therapeutic drug level monitoring: Secondary | ICD-10-CM

## 2019-09-26 DIAGNOSIS — E785 Hyperlipidemia, unspecified: Secondary | ICD-10-CM

## 2019-09-26 MED ORDER — ROSUVASTATIN CALCIUM 20 MG PO TABS: 20.0000 mg | ORAL_TABLET | Freq: Every day | ORAL | 3 refills | Status: DC

## 2019-09-26 NOTE — Telephone Encounter (Signed)
-----   Message from Skeet Latch, MD sent at 09/24/2019  8:26 PM EDT ----- Cholesterol levels are too high.  Continue working to increase exercise as tolerated.  Recommend starting rosuvastatin 20mg .  Check lipids/CMP in 3 months.

## 2019-09-26 NOTE — Telephone Encounter (Signed)
Spoke with patient and she took Zocor in the past and had muscle aches  She will start the Crestor 20 mg 1/2 tablet daily to see if she tolerates. If she tolerates ok she will increase to a full tablet Mailed lab orders

## 2019-10-20 ENCOUNTER — Other Ambulatory Visit: Payer: Self-pay | Admitting: Cardiovascular Disease

## 2019-10-21 ENCOUNTER — Other Ambulatory Visit: Payer: Self-pay | Admitting: Cardiovascular Disease

## 2019-11-21 ENCOUNTER — Other Ambulatory Visit: Payer: Self-pay | Admitting: Cardiovascular Disease

## 2019-11-23 ENCOUNTER — Other Ambulatory Visit: Payer: Self-pay | Admitting: Cardiovascular Disease

## 2019-11-24 ENCOUNTER — Other Ambulatory Visit: Payer: Self-pay | Admitting: Cardiovascular Disease

## 2019-11-24 MED ORDER — RANOLAZINE ER 1000 MG PO TB12: 1000.0000 mg | ORAL_TABLET | Freq: Two times a day (BID) | ORAL | 2 refills | Status: DC

## 2019-11-24 MED ORDER — RANOLAZINE ER 1000 MG PO TB12: 1000.0000 mg | ORAL_TABLET | Freq: Two times a day (BID) | ORAL | 11 refills | Status: AC

## 2019-11-24 NOTE — Addendum Note (Signed)
Addended by: Venetia Maxon on: 11/24/2019 02:07 PM   Modules accepted: Orders

## 2019-11-24 NOTE — Telephone Encounter (Signed)
Refills sent

## 2019-11-24 NOTE — Telephone Encounter (Signed)
*  STAT* If patient is at the pharmacy, call can be transferred to refill team.   1. Which medications need to be refilled? (please list name of each medication and dose if known) ranolazine (RANEXA) 1000 MG SR tablet  2. Which pharmacy/location (including street and city if local pharmacy) is medication to be sent to? [place] ([place] - [address]  3. Do they need a 30 day or 90 day supply? 90 day supply

## 2019-12-15 ENCOUNTER — Other Ambulatory Visit: Payer: Self-pay | Admitting: Cardiovascular Disease

## 2020-03-25 ENCOUNTER — Other Ambulatory Visit: Payer: Self-pay | Admitting: Cardiovascular Disease

## 2020-05-24 ENCOUNTER — Ambulatory Visit: Payer: Commercial Managed Care - PPO | Admitting: Cardiovascular Disease

## 2020-06-05 ENCOUNTER — Other Ambulatory Visit: Payer: Self-pay | Admitting: Cardiovascular Disease

## 2020-06-20 ENCOUNTER — Other Ambulatory Visit (HOSPITAL_COMMUNITY): Payer: Self-pay | Admitting: Surgery

## 2020-06-20 ENCOUNTER — Other Ambulatory Visit: Payer: Self-pay | Admitting: Surgery

## 2020-06-20 DIAGNOSIS — K8050 Calculus of bile duct without cholangitis or cholecystitis without obstruction: Secondary | ICD-10-CM

## 2020-06-21 ENCOUNTER — Other Ambulatory Visit: Payer: Self-pay | Admitting: Surgery

## 2020-06-21 DIAGNOSIS — K8050 Calculus of bile duct without cholangitis or cholecystitis without obstruction: Secondary | ICD-10-CM

## 2020-06-25 ENCOUNTER — Other Ambulatory Visit: Payer: Self-pay | Admitting: Surgery

## 2020-06-25 ENCOUNTER — Ambulatory Visit
Admission: RE | Admit: 2020-06-25 | Discharge: 2020-06-25 | Disposition: A | Discharge status: Home / Self Care | Payer: BC Managed Care – PPO | Source: Ambulatory Visit | Attending: Surgery | Admitting: Surgery

## 2020-06-25 ENCOUNTER — Encounter (HOSPITAL_COMMUNITY): Payer: Self-pay

## 2020-06-25 ENCOUNTER — Ambulatory Visit (HOSPITAL_COMMUNITY): Payer: BC Managed Care – PPO

## 2020-06-25 DIAGNOSIS — K8050 Calculus of bile duct without cholangitis or cholecystitis without obstruction: Secondary | ICD-10-CM

## 2020-06-25 MED ORDER — GADOBENATE DIMEGLUMINE 529 MG/ML IV SOLN
16.0000 mL | Freq: Once | INTRAVENOUS | Status: AC | PRN
  Administered 2020-06-25: 16 mL via INTRAVENOUS

## 2020-07-15 ENCOUNTER — Ambulatory Visit: Payer: Self-pay | Admitting: Surgery

## 2020-07-15 NOTE — H&P (Signed)
Surgical Evaluation  Chief Complaint: abdominal pain  QTM:AUQJ is a very pleasant 49 year old woman referred for evaluation of symptomatic gallstones and possible choledocholithiasis. She has a significant cardiac history, having seen multiple cardiologists over the last several years and ultimately diagnosed with microvascular disease. Her current cardiologist is Dr. Amie Portland of [place] in [place].  For the last several months, she has been experiencing epigastric and bilateral subcostal pain which radiates to the back, associated with occasional nausea and decreased appetite, bloating, pain is exacerbated by eating at times, pain is only occasionally relieved by nitroglycerin. Many of these symptoms are similar to her previous angina equivalents and therefore the symptoms were confounded. She had a cardiac PET on April 1 this year which noted abnormal wall motion and thickening of the apex, likely apical infarct with no evidence of ischemia, with abnormal myocardial blood flow reserve and overall consistent with microvascular disease and vasospasm. Incidental findings included a left breast lesion and several large gallstones and possible common bile duct stone as well as liver cysts. Report reviewed the imaging is not available. Further cross sectional imaging was recommended but has not been completed at this point. She is referred here in regards to the gallstones.  She has previously had a laparoscopic ovarian cystectomy and tubal ligation, but no other previous abdominal surgeries.  Allergies  Allergen Reactions  . Zocor [Simvastatin]     Myalgia   . Vicodin [Hydrocodone-Acetaminophen] Nausea And Vomiting    Past Medical History:  Diagnosis Date  . Acid reflux   . Anxiety   . Asthmatic bronchitis    a. Typically when the weather changes.  . Cholelithiasis   . Coronary artery dissection    a. s/p LHC 08/17/13 SCAD which likely originated in the mid LAD and propagated retrograde back to  the ostium of the left main. Treated medically.   . H/O varicella   . Herniated disc, cervical   . Hypotension    a. prev taken off BB due to BP 33L-45G systolic.  . Ischemic cardiomyopathy    a. EF 35%, LV apical hypokinesia by cath 08/17/13 - f/u echo 08/2013 EF 50-55%.  . Lung nodule    a. incidental finding on CT 08/2013. Needs follow up in 08/2014  . Menstrual migraine   . Microcytosis    a. Low ferritin 11/2013.  . Monilial vaginitis 2005  . Neuromuscular disorder ([place])   . Obesity, Class II, BMI 35-39.9   . Ovarian cyst, left 2012  . PMS (premenstrual syndrome)   . PVC's (premature ventricular contractions)     Past Surgical History:  Procedure Laterality Date  . COMBINED HYSTEROSCOPY DIAGNOSTIC / D&C  01/15/2015   CCOBGYN, Dr. Cletis Media  . HYSTEROSCOPY W/ ENDOMETRIAL ABLATION  01/15/2015   CCOBGYN, Dr. Cletis Media  . LAPAROSCOPIC TUBAL LIGATION  02/20/2011   Procedure: LAPAROSCOPIC TUBAL LIGATION;  Surgeon: Alwyn Pea, MD;  Location: [facility];  Service: Gynecology;  Laterality: Bilateral;  with Removal of Mirena and Robotic Assisted Bilateral Tubal Ligation  . LEFT HEART CATHETERIZATION WITH CORONARY ANGIOGRAM Bilateral 08/17/2013   Procedure: LEFT HEART CATHETERIZATION WITH CORONARY ANGIOGRAM;  Surgeon: Jettie Booze, MD;  Location: [facility] CATH LAB;  Service: Cardiovascular;  Laterality: Bilateral;  . ROBOTIC ASSISTED LAPAROSCOPIC OVARIAN CYSTECTOMY  02/2011  . TUBAL LIGATION  02/20/2011  . WISDOM TOOTH EXTRACTION      Family History  Problem Relation Age of Onset  . Uterine cancer Mother   . Breast cancer Paternal Aunt 59  . Hypertension  Father   . Diabetes Father   . Heart disease Father   . Hypertension Brother   . Hypertension Brother   . Heart disease Paternal Grandmother     Social History   Socioeconomic History  . Marital status: Married    Spouse name: Ovid Curd  . Number of children: 2  . Years of education: 17+  . Highest education level: Not on file   Occupational History  . 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 Smoking status: Never Smoker  . Smokeless tobacco: Never Used  Substance and Sexual Activity  . Alcohol use: No  . Drug use: No  . Sexual activity: Yes    Partners: Male    Birth control/protection: Surgical  Other Topics Concern  . Not on file  Social History Narrative   Lives with husband, daughter is in and out, son is in college (04/01/2017).   Social Determinants of Health   Financial Resource Strain: Not on file  Food Insecurity: Not on file  Transportation Needs: Not on file  Physical Activity: Not on file  Stress: Not on file  Social Connections: Not on file    Current Outpatient Medications on File Prior to Visit  Medication Sig Dispense Refill  . aspirin 81 MG tablet Take 1 tablet (81 mg total) by mouth daily. 30 tablet 0  . furosemide (LASIX) 20 MG tablet TAKE 1 TABLET BY MOUTH DAILY FOR 3 DAYS AND THEN AS NEEDED FOR SWELLING/SOB 30 tablet 6  . isosorbide mononitrate (IMDUR) 60 MG 24 hr tablet TAKE 1 AND 1/2 TABLET BY MOUTH EVERY MORNING AND 1 TABLET EVERY EVENING 90 tablet 3  . Multiple Vitamin (MULTIVITAMIN) tablet Take 1 tablet by mouth daily.    . nitroGLYCERIN (NITROSTAT) 0.4 MG SL tablet PLACE 1 TABLET (0.4 MG TOTAL) UNDER THE TONGUE EVERY 5 (FIVE) MINUTES AS NEEDED FOR CHEST PAIN. 25 tablet 2  . ranolazine (RANEXA) 1000 MG SR tablet Take 1 tablet (1,000 mg total) by mouth 2 (two) times daily. 60 tablet 11  . rosuvastatin (CRESTOR) 20 MG tablet Take 1 tablet (20 mg total) by mouth daily. 90 tablet 3   No current facility-administered medications on file prior to visit.    Review of Systems:  General Present- Appetite Loss, Fatigue and Weight Gain. Not Present- Chills, Fever, Night Sweats and Weight Loss. Skin Not Present- Change in Wart/Mole, Dryness, Hives, Jaundice, New Lesions, Non-Healing Wounds, Rash and Ulcer. HEENT Not  Present- Earache, Hearing Loss, Hoarseness, Nose Bleed, Oral Ulcers, Ringing in the Ears, Seasonal Allergies, Sinus Pain, Sore Throat, Visual Disturbances, Wears glasses/contact lenses and Yellow Eyes. Respiratory Not Present- Bloody sputum, Chronic Cough, Difficulty Breathing, Snoring and Wheezing. Breast Not Present- Breast Mass, Breast Pain, Nipple Discharge and Skin Changes. Cardiovascular Present- Chest Pain, Palpitations, Rapid Heart Rate and Shortness of Breath. Not Present- Difficulty Breathing Lying Down, Leg Cramps and Swelling of Extremities. Gastrointestinal Present- Abdominal Pain, Bloating, Indigestion and Nausea. Not Present- Bloody Stool, Change in Bowel Habits, Chronic diarrhea, Constipation, Difficulty Swallowing, Excessive gas, Gets full quickly at meals, Hemorrhoids, Rectal Pain and Vomiting. Female Genitourinary Not Present- Frequency, Nocturia, Painful Urination, Pelvic Pain and Urgency. Musculoskeletal Not Present- Back Pain, Joint Pain, Joint Stiffness, Muscle Pain, Muscle Weakness and Swelling of Extremities. Neurological Not Present- Decreased Memory, Fainting, Headaches, Numbness, Seizures, Tingling, Tremor, Trouble walking and Weakness. Psychiatric Not Present- Anxiety, Bipolar, Change in Sleep Pattern, Depression, Fearful and Frequent crying. Endocrine Not Present- Cold  Intolerance, Excessive Hunger, Hair Changes, Heat Intolerance, Hot flashes and New Diabetes. Hematology Not Present- Blood Thinners, Easy Bruising, Excessive bleeding, Gland problems, HIV and Persistent Infections. All other systems negative  Physical Exam: Vitals  Weight: 177 lb Height: 59in Body Surface Area: 1.75 m Body Mass Index: 35.75 kg/m  Temp.: 97.66F  Pulse: 85 (Regular)  P.OX: 99% (Room air) BP: 110/64(Sitting, Left Arm, Standard)   Alert, well-appearing Unlabored respirations    CBC Latest Ref Rng & Units 07/02/2017 11/21/2015 12/04/2013  WBC 3.4 - 10.8 x10E3/uL 6.2 5.8  6.7  Hemoglobin 11.1 - 15.9 g/dL 13.9 14.0 13.3  Hematocrit 34.0 - 46.6 % 39.6 40.2 40.3  Platelets 150 - 379 x10E3/uL 289 266 260.0    CMP Latest Ref Rng & Units 09/21/2019 04/28/2018 11/21/2015  Glucose 65 - 99 mg/dL 100(H) 99 105(H)  BUN 6 - 24 mg/dL 12 9 9   Creatinine 0.57 - 1.00 mg/dL 0.91 0.83 0.89  Sodium 134 - 144 mmol/L 144 140 138  Potassium 3.5 - 5.2 mmol/L 4.7 4.4 4.5  Chloride 96 - 106 mmol/L 104 105 107  CO2 20 - 29 mmol/L 24 21 25   Calcium 8.7 - 10.2 mg/dL 10.8(H) 9.3 9.5  Total Protein 6.0 - 8.5 g/dL 6.6 - -  Total Bilirubin 0.0 - 1.2 mg/dL 0.4 - -  Alkaline Phos 48 - 121 IU/L 36(L) - -  AST 0 - 40 IU/L 12 - -  ALT 0 - 32 IU/L 13 - -    Lab Results  Component Value Date   INR 0.96 08/17/2013    Imaging: No results found.   A/P:  CHOLEDOCHOLITHIASIS (K80.50) Story: Incidental, possibility noted on recent cardiac PET scan. I recommend further imaging with MRCP to definitively evaluate the common bile duct. Choledocholithiasis confirmed, I think it would be reasonable to attempt a coordinated cholecystectomy/ERCP.  Patient underwent MRCP 5/10 which confirms cholelithiasis up to 1 cm in size at the neck of the gallbladder, several cysts noted within both lobes of the liver up to 1.2 cm in diameter, no biliary ductal dilatation or choledocholithiasis. BILIARY COLIC (W29.93) Story: Her symptoms are consistent with biliary etiology. I do recommend ultimately proceeding with laparoscopic /robotic cholecystectomy with possible intraoperative cholangiogram. We went over the relevant anatomy and pathology using a diagram to demonstrate. Discussed risks of surgery including bleeding, pain, scarring, intraabdominal injury specifically to the common bile duct and sequelae, bile leak, conversion to open surgery, failure to resolve symptoms, blood clots/ pulmonary embolus, heart attack, pneumonia, stroke, death. Questions welcomed and answered to patient's satisfaction.  Plan to  proceed with laparoscopic versus robotic cholecystectomy.    Patient Active Problem List   Diagnosis Date Noted  . Shortness of breath 11/18/2015  . Obesity, Class II, BMI 35-39.9   . DOE (dyspnea on exertion) 05/17/2015  . Chest pain 05/17/2015  . Old MI (myocardial infarction) 01/23/2014  . Microcytosis   . PVC's (premature ventricular contractions)   . Asthmatic bronchitis   . LV dysfunction 08/22/2013  . Lung nodule   . Coronary artery dissection 08/17/2013  . Acute myocardial infarction of other anterior wall, initial episode of care 08/17/2013  . Cervicalgia 05/16/2012  . Herniated disc, cervical   . Migraine 12/31/2011  . Left ovarian cyst 02/20/2011       Romana Juniper, MD [facility]  See AMION to contact appropriate on-call provider

## 2020-08-01 NOTE — Progress Notes (Addendum)
PCP - Delsa Bern, MD Cardiologist - Macie Burows, MD lov 06-12-20 care everywhere note on chart  PPM/ICD -  Device Orders -  Rep Notified -   Chest x-ray -  EKG - 09-21-19 epic, EKG 04-23-20 on chart  Stress Test - 06-05-20 care everywhere ECHO - 2018 Cardiac Cath -   Sleep Study -  CPAP -   Fasting Blood Sugar -  Checks Blood Sugar _____ times a day  Blood Thinner Instructions: Aspirin Instructions:  81 mg   ERAS Protcol - PRE-SURGERY Ensure or G2-   COVID TEST- Ambulatory  Activity-Able to walk a flight of stairs without SOB Anesthesia review: SCAD, MI 2015  Patient denies shortness of breath, fever, cough and chest pain at PAT appointment   All instructions explained to the patient, with a verbal understanding of the material. Patient agrees to go over the instructions while at home for a better understanding. Patient also instructed to self quarantine after being tested for COVID-19. The opportunity to ask questions was provided.

## 2020-08-01 NOTE — Patient Instructions (Addendum)
DUE TO COVID-19 ONLY ONE VISITOR IS ALLOWED TO COME WITH YOU AND STAY IN THE WAITING ROOM ONLY DURING PRE OP AND PROCEDURE DAY OF SURGERY.  Two VISITOR  MAY VISIT WITH YOU AFTER SURGERY IN YOUR PRIVATE ROOM DURING VISITING HOURS ONLY!  YOU NEED TO HAVE A COVID 19 TEST On__not needed_____ @_______ , THIS TEST MUST BE DONE BEFORE SURGERY,    COVID TESTING SITE [address] [place] [address],   IT IS ON THE RIGHT GOING OUT [address] APPROXIMATELY  2 MINUTES PAST [employer] ON THE RIGHT. ONCE YOUR COVID TEST IS COMPLETED,  PLEASE BEGIN THE QUARANTINE INSTRUCTIONS AS OUTLINED IN YOUR HANDOUT.                [name]  08/01/2020   Your procedure is scheduled on: 08-14-20   Report to [facility] Main  Entrance   Report to admitting at      [place] AM     Call this number if you have problems the morning of surgery [phone]    Remember: Do not eat food :After Midnight.  You may have clear liquids until             0830  am then nothing by mouth    CLEAR LIQUID DIET   Foods Allowed                                                                                 Foods Excluded  Black Coffee and tea, regular and decaf                             liquids that you cannot  Plain Jell-O any favor except red or purple                                           see through such as: Fruit ices (not with fruit pulp)                                                      milk, soups, orange juice  Iced Popsicles                                                        All solid food Carbonated beverages, regular and diet                                    Cranberry, grape and apple juices Sports drinks like Gatorade Lightly seasoned clear broth or consume(fat free) Sugar, honey syrup   _____________________________________________________________________     BRUSH YOUR TEETH MORNING OF  SURGERY AND RINSE YOUR MOUTH OUT, NO CHEWING GUM CANDY OR  MINTS.     Take these medicines the morning of surgery with A SIP OF WATER: Ranexa, Imdur                               You may not have any metal on your body including hair pins and              piercings  Do not wear jewelry, make-up, lotions, powders or perfumes, deodorant             Do not wear nail polish on your fingernails.  Do not shave  48 hours prior to surgery.           Do not bring valuables to the hospital. [place].  Contacts, dentures or bridgework may not be worn into surgery.       Patients discharged the day of surgery will not be allowed to drive home. IF YOU ARE HAVING SURGERY AND GOING HOME THE SAME DAY, YOU MUST HAVE AN ADULT TO DRIVE YOU HOME AND BE WITH YOU FOR 24 HOURS. YOU MAY GO HOME BY TAXI OR UBER OR ORTHERWISE, BUT AN ADULT MUST ACCOMPANY YOU HOME AND STAY WITH YOU FOR 24 HOURS.  Name and phone number of your driver:  Special Instructions: N/A              Please read over the following fact sheets you were given: _____________________________________________________________________             [place] - Preparing for Surgery Before surgery, you can play an important role.  Because skin is not sterile, your skin needs to be as free of germs as possible.  You can reduce the number of germs on your skin by washing with CHG (chlorahexidine gluconate) soap before surgery.  CHG is an antiseptic cleaner which kills germs and bonds with the skin to continue killing germs even after washing. Please DO NOT use if you have an allergy to CHG or antibacterial soaps.  If your skin becomes reddened/irritated stop using the CHG and inform your nurse when you arrive at Short Stay. Do not shave (including legs and underarms) for at least 48 hours prior to the first CHG shower.  You may shave your face/neck. Please follow these instructions carefully:  1.  Shower with CHG Soap the night before surgery and the  morning  of Surgery.  2.  If you choose to wash your hair, wash your hair first as usual with your  normal  shampoo.  3.  After you shampoo, rinse your hair and body thoroughly to remove the  shampoo.                           4.  Use CHG as you would any other liquid soap.  You can apply chg directly  to the skin and wash                       Gently with a scrungie or clean washcloth.  5.  Apply the CHG Soap to your body ONLY FROM THE NECK DOWN.   Do not use on face/ open  Wound or open sores. Avoid contact with eyes, ears mouth and genitals (private parts).                       Wash face,  Genitals (private parts) with your normal soap.             6.  Wash thoroughly, paying special attention to the area where your surgery  will be performed.  7.  Thoroughly rinse your body with warm water from the neck down.  8.  DO NOT shower/wash with your normal soap after using and rinsing off  the CHG Soap.                9.  Pat yourself dry with a clean towel.            10.  Wear clean pajamas.            11.  Place clean sheets on your bed the night of your first shower and do not  sleep with pets. Day of Surgery : Do not apply any lotions/deodorants the morning of surgery.  Please wear clean clothes to the hospital/surgery center.  FAILURE TO FOLLOW THESE INSTRUCTIONS MAY RESULT IN THE CANCELLATION OF YOUR SURGERY PATIENT SIGNATURE_________________________________  NURSE SIGNATURE__________________________________  ________________________________________________________________________

## 2020-08-06 ENCOUNTER — Other Ambulatory Visit: Payer: Self-pay | Admitting: Cardiovascular Disease

## 2020-08-06 ENCOUNTER — Other Ambulatory Visit: Payer: Self-pay | Admitting: Obstetrics and Gynecology

## 2020-08-07 ENCOUNTER — Encounter (HOSPITAL_COMMUNITY): Payer: Self-pay

## 2020-08-07 ENCOUNTER — Encounter (HOSPITAL_COMMUNITY)
Admission: RE | Admit: 2020-08-07 | Discharge: 2020-08-07 | Disposition: A | Discharge status: Home / Self Care | Payer: BC Managed Care – PPO | Source: Ambulatory Visit | Attending: Surgery | Admitting: Surgery

## 2020-08-07 ENCOUNTER — Other Ambulatory Visit: Payer: Self-pay

## 2020-08-07 DIAGNOSIS — Z01812 Encounter for preprocedural laboratory examination: Secondary | ICD-10-CM | POA: Diagnosis not present

## 2020-08-07 DIAGNOSIS — I2510 Atherosclerotic heart disease of native coronary artery without angina pectoris: Secondary | ICD-10-CM | POA: Diagnosis not present

## 2020-08-07 DIAGNOSIS — Z7982 Long term (current) use of aspirin: Secondary | ICD-10-CM | POA: Insufficient documentation

## 2020-08-07 DIAGNOSIS — K8050 Calculus of bile duct without cholangitis or cholecystitis without obstruction: Secondary | ICD-10-CM | POA: Diagnosis not present

## 2020-08-07 DIAGNOSIS — Z79899 Other long term (current) drug therapy: Secondary | ICD-10-CM | POA: Diagnosis not present

## 2020-08-07 HISTORY — DX: Acute myocardial infarction, unspecified: I21.9

## 2020-08-07 LAB — BASIC METABOLIC PANEL
Anion gap: 5 (ref 5–15)
BUN: 11 mg/dL (ref 6–20)
CO2: 27 mmol/L (ref 22–32)
Calcium: 9.2 mg/dL (ref 8.9–10.3)
Chloride: 108 mmol/L (ref 98–111)
Creatinine, Ser: 1.09 mg/dL — ABNORMAL HIGH (ref 0.44–1.00)
GFR, Estimated: >60 mL/min (ref >60)
Glucose, Bld: 112 mg/dL — ABNORMAL HIGH (ref 70–99)
Potassium: 4.2 mmol/L (ref 3.5–5.1)
Sodium: 140 mmol/L (ref 135–145)

## 2020-08-07 LAB — CBC
HCT: 39.7 % (ref 36.0–46.0)
Hemoglobin: 14.1 g/dL (ref 12.0–15.0)
MCH: 28 pg (ref 26.0–34.0)
MCHC: 35.5 g/dL (ref 30.0–36.0)
MCV: 78.8 fL — ABNORMAL LOW (ref 80.0–100.0)
Platelets: 270 10*3/uL (ref 150–400)
RBC: 5.04 MIL/uL (ref 3.87–5.11)
RDW: 13.8 % (ref 11.5–15.5)
WBC: 5.4 10*3/uL (ref 4.0–10.5)
nRBC: 0 % (ref 0.0–0.2)

## 2020-08-08 NOTE — Progress Notes (Signed)
Anesthesia Chart Review   Case: [id number] Date/Time: 08/14/20 1115   Procedure: ROBOTIC CHOLECYSTECTOMY   Anesthesia type: General   Pre-op diagnosis: biliary colic   Location: [facility] ROOM 02 / [facility]   Surgeons: Clovis Riley, MD       DISCUSSION:50 y.o. never smoker with h/o CAD, biliary colic scheduled for above procedure 08/14/2020 with Dr. Romana Juniper.   Pt last seen by cardiology 06/12/2020. Stress test 06/05/2020 negative for ischemia.   VS: BP 123/75   Pulse 82   Temp 36.8 C (Oral)   Resp 16   Ht 4\' 11"  (1.499 m)   Wt 78.9 kg   LMP 07/15/2020 (Approximate)   SpO2 99%   BMI 35.14 kg/m   PROVIDERS: Delsa Bern, MD is PCP   Al Pimple, MD is Cardiologist  LABS: Labs reviewed: Acceptable for surgery. (all labs ordered are listed, but only abnormal results are displayed)  Labs Reviewed  BASIC METABOLIC PANEL - Abnormal; Notable for the following components:      Result Value   Glucose, Bld 112 (*)    Creatinine, Ser 1.09 (*)    All other components within normal limits  CBC - Abnormal; Notable for the following components:   MCV 78.8 (*)    All other components within normal limits     IMAGES:   EKG: 09/21/2019 Rate 72 bpm  NSR  CV: Stress Test 06/05/2020 Conclusion: No Evidence of Coronary Artery Calcifications. No Evidence ofIschemia. Evidence of Infarct (8%) by Visual and   Echo 09/01/2016 Study Conclusions   - Left ventricle: The cavity size was normal. Wall thickness was at    the upper limits of normal. Systolic function was normal. The    estimated ejection fraction was in the range of 55% to 60%. Wall    motion was normal; there were no regional wall motion    abnormalities. Left ventricular diastolic function parameters    were normal.  - Right ventricle: The cavity size was normal. Systolic function    was normal.  Past Medical History:  Diagnosis Date   Acid reflux    Anxiety    Asthmatic bronchitis    a. Typically when the  weather changes.   Cholelithiasis    Coronary artery dissection    a. s/p LHC 08/17/13 SCAD which likely originated in the mid LAD and propagated retrograde back to the ostium of the left main. Treated medically.    H/O varicella    Herniated disc, cervical    Hypotension    a. prev taken off BB due to BP 84O-96E systolic.   Ischemic cardiomyopathy    a. EF 35%, LV apical hypokinesia by cath 08/17/13 - f/u echo 08/2013 EF 50-55%.   Lung nodule    a. incidental finding on CT 08/2013. Needs follow up in 08/2014   Menstrual migraine    Microcytosis    a. Low ferritin 11/2013.   Monilial vaginitis 2005   Myocardial infarction ([place])    7-5 2015   Neuromuscular disorder (HCC)    herniated disc c5,c6   Obesity, Class II, BMI 35-39.9    Ovarian cyst, left 2012   PMS (premenstrual syndrome)    PVC's (premature ventricular contractions)     Past Surgical History:  Procedure Laterality Date   COMBINED HYSTEROSCOPY DIAGNOSTIC / D&C  01/15/2015   [facility], Dr. Cletis Media   HYSTEROSCOPY W/ ENDOMETRIAL ABLATION  01/15/2015   [facility], Dr. Cletis Media   LAPAROSCOPIC TUBAL LIGATION  02/20/2011   Procedure:  LAPAROSCOPIC TUBAL LIGATION;  Surgeon: Alwyn Pea, MD;  Location: [facility];  Service: Gynecology;  Laterality: Bilateral;  with Removal of Mirena and Robotic Assisted Bilateral Tubal Ligation   LEFT HEART CATHETERIZATION WITH CORONARY ANGIOGRAM Bilateral 08/17/2013   Procedure: LEFT HEART CATHETERIZATION WITH CORONARY ANGIOGRAM;  Surgeon: Jettie Booze, MD;  Location[facility] C[facility] LAB;  Service: Cardiovascular;  Laterality: Bilateral;   ROBOTIC ASSISTED LAPAROSCOPIC OVARIAN CYSTECTOMY  02/2011   TUBAL LIGATION  02/20/2011   WISDOM TOOTH EXTRACTION      MEDICATIONS:  aspirin 81 MG tablet   furosemide (LASIX) 20 MG tablet   isosorbide mononitrate (IMDUR) 60 MG 24 hr tablet   Multiple Vitamin (MULTIVITAMIN) tablet   nitroGLYCERIN (NITROSTAT) 0.4 MG SL tablet   ranolazine (RANEXA) 1000 MG SR tablet   No  current facility-administered medications for this encounter.     Konrad Felix, PA-C [facility]-Surgical Testing [phone]

## 2020-08-12 ENCOUNTER — Other Ambulatory Visit (HOSPITAL_COMMUNITY): Payer: BC Managed Care – PPO

## 2020-08-16 DIAGNOSIS — K8020 Calculus of gallbladder without cholecystitis without obstruction: Secondary | ICD-10-CM

## 2020-08-16 HISTORY — DX: Calculus of gallbladder without cholecystitis without obstruction: K80.20

## 2020-08-21 ENCOUNTER — Encounter (HOSPITAL_COMMUNITY): Payer: Self-pay | Admitting: Surgery

## 2020-08-21 NOTE — Anesthesia Preprocedure Evaluation (Addendum)
Anesthesia Evaluation  Patient identified by MRN, date of birth, ID band Patient awake    Reviewed: Allergy & Precautions, NPO status , Patient's Chart, lab work & pertinent test results  History of Anesthesia Complications Negative for: history of anesthetic complications  Airway Mallampati: II  TM Distance: >3 FB Neck ROM: Full    Dental no notable dental hx. (+) Dental Advisory Given   Pulmonary asthma ,    Pulmonary exam normal        Cardiovascular + Past MI  Normal cardiovascular exam  Study Conclusions   - Left ventricle: The cavity size was normal. Wall thickness was at  the upper limits of normal. Systolic function was normal. The  estimated ejection fraction was in the range of 55% to 60%. Wall  motion was normal; there were no regional wall motion  abnormalities. Left ventricular diastolic function parameters  were normal.  - Right ventricle: The cavity size was normal. Systolic function  was normal.   Impressions:   - Normal study. Previously noted apical wall motion abnormalities  is not evident on today&'s study.    Neuro/Psych PSYCHIATRIC DISORDERS Anxiety negative neurological ROS     GI/Hepatic Neg liver ROS, GERD  ,  Endo/Other  negative endocrine ROS  Renal/GU negative Renal ROS     Musculoskeletal negative musculoskeletal ROS (+)   Abdominal   Peds  Hematology negative hematology ROS (+)   Anesthesia Other Findings   Reproductive/Obstetrics                            Anesthesia Physical Anesthesia Plan  ASA: 3  Anesthesia Plan: General   Post-op Pain Management:    Induction: Intravenous  PONV Risk Score and Plan: 4 or greater  Airway Management Planned: Oral ETT  Additional Equipment:   Intra-op Plan:   Post-operative Plan: Extubation in OR  Informed Consent: I have reviewed the patients History and Physical, chart, labs and  discussed the procedure including the risks, benefits and alternatives for the proposed anesthesia with the patient or authorized representative who has indicated his/her understanding and acceptance.     Dental advisory given  Plan Discussed with: Anesthesiologist and CRNA  Anesthesia Plan Comments:        Anesthesia Quick Evaluation

## 2020-08-21 NOTE — Progress Notes (Signed)
EKG: 09/21/19 CXR: na ECHO: 09/01/16 Stress Test:  Cardiac Cath: 08/17/13  Fasting Blood Sugar- na Checks Blood Sugar__na_ times a day  OSA/CPAP: No  ASA: Last dose 2 months ago Blood Thinners: No  Covid test not needed  Anesthesia Review:  Yes, cardiac history  Patient denies shortness of breath, fever, cough, and chest pain at PAT appointment.  Patient verbalized understanding of instructions provided today at the PAT appointment.  Patient asked to review instructions at home and day of surgery.

## 2020-08-22 ENCOUNTER — Ambulatory Visit (HOSPITAL_COMMUNITY)
Admission: RE | Admit: 2020-08-22 | Discharge: 2020-08-22 | Disposition: A | Discharge status: Home / Self Care | Payer: BC Managed Care – PPO | Attending: Surgery | Admitting: Surgery

## 2020-08-22 ENCOUNTER — Ambulatory Visit (HOSPITAL_COMMUNITY): Payer: BC Managed Care – PPO

## 2020-08-22 ENCOUNTER — Encounter (HOSPITAL_COMMUNITY)
Admission: RE | Disposition: A | Discharge status: Home / Self Care | Payer: Self-pay | Source: Home / Self Care | Attending: Surgery

## 2020-08-22 ENCOUNTER — Ambulatory Visit (HOSPITAL_COMMUNITY): Payer: BC Managed Care – PPO | Admitting: Physician Assistant

## 2020-08-22 ENCOUNTER — Encounter (HOSPITAL_COMMUNITY): Payer: Self-pay | Admitting: Surgery

## 2020-08-22 DIAGNOSIS — Z888 Allergy status to other drugs, medicaments and biological substances status: Secondary | ICD-10-CM | POA: Insufficient documentation

## 2020-08-22 DIAGNOSIS — Z885 Allergy status to narcotic agent status: Secondary | ICD-10-CM | POA: Insufficient documentation

## 2020-08-22 DIAGNOSIS — Z7982 Long term (current) use of aspirin: Secondary | ICD-10-CM | POA: Insufficient documentation

## 2020-08-22 DIAGNOSIS — Z79899 Other long term (current) drug therapy: Secondary | ICD-10-CM | POA: Diagnosis not present

## 2020-08-22 DIAGNOSIS — K8010 Calculus of gallbladder with chronic cholecystitis without obstruction: Secondary | ICD-10-CM | POA: Insufficient documentation

## 2020-08-22 DIAGNOSIS — K8050 Calculus of bile duct without cholangitis or cholecystitis without obstruction: Secondary | ICD-10-CM | POA: Diagnosis present

## 2020-08-22 HISTORY — PX: CHOLECYSTECTOMY: SHX55

## 2020-08-22 LAB — POCT PREGNANCY, URINE: Preg Test, Ur: NEGATIVE

## 2020-08-22 SURGERY — LAPAROSCOPIC CHOLECYSTECTOMY
Anesthesia: General

## 2020-08-22 MED ORDER — GABAPENTIN 300 MG PO CAPS
300.0000 mg | ORAL_CAPSULE | ORAL | Status: AC
  Administered 2020-08-22: 300 mg via ORAL
  Filled 2020-08-22: qty 1

## 2020-08-22 MED ORDER — AMISULPRIDE (ANTIEMETIC) 5 MG/2ML IV SOLN: 10.0000 mg | Freq: Once | INTRAVENOUS | Status: DC | PRN

## 2020-08-22 MED ORDER — ACETAMINOPHEN 500 MG PO TABS
1000.0000 mg | ORAL_TABLET | ORAL | Status: AC
  Administered 2020-08-22: 1000 mg via ORAL
  Filled 2020-08-22: qty 2

## 2020-08-22 MED ORDER — ONDANSETRON HCL 4 MG/2ML IJ SOLN
INTRAMUSCULAR | Status: DC | PRN
  Administered 2020-08-22: 4 mg via INTRAVENOUS

## 2020-08-22 MED ORDER — CEFAZOLIN SODIUM-DEXTROSE 2-4 GM/100ML-% IV SOLN
2.0000 g | INTRAVENOUS | Status: AC
  Administered 2020-08-22: 2 g via INTRAVENOUS
  Filled 2020-08-22: qty 100

## 2020-08-22 MED ORDER — TRAMADOL HCL 50 MG PO TABS: 50.0000 mg | ORAL_TABLET | Freq: Four times a day (QID) | ORAL | 0 refills | Status: AC | PRN

## 2020-08-22 MED ORDER — FENTANYL CITRATE (PF) 250 MCG/5ML IJ SOLN
INTRAMUSCULAR | Status: AC
  Filled 2020-08-22: qty 5

## 2020-08-22 MED ORDER — BUPIVACAINE-EPINEPHRINE (PF) 0.25% -1:200000 IJ SOLN
INTRAMUSCULAR | Status: AC
  Filled 2020-08-22: qty 30

## 2020-08-22 MED ORDER — PHENYLEPHRINE 40 MCG/ML (10ML) SYRINGE FOR IV PUSH (FOR BLOOD PRESSURE SUPPORT)
PREFILLED_SYRINGE | INTRAVENOUS | Status: DC | PRN
  Administered 2020-08-22: 40 ug via INTRAVENOUS
  Administered 2020-08-22 (×2): 80 ug via INTRAVENOUS
  Administered 2020-08-22: 40 ug via INTRAVENOUS
  Administered 2020-08-22 (×2): 80 ug via INTRAVENOUS

## 2020-08-22 MED ORDER — MIDAZOLAM HCL 5 MG/5ML IJ SOLN
INTRAMUSCULAR | Status: DC | PRN
  Administered 2020-08-22: 2 mg via INTRAVENOUS

## 2020-08-22 MED ORDER — ORAL CARE MOUTH RINSE: 15.0000 mL | Freq: Once | OROMUCOSAL | Status: AC

## 2020-08-22 MED ORDER — DEXAMETHASONE SODIUM PHOSPHATE 10 MG/ML IJ SOLN
INTRAMUSCULAR | Status: AC
  Filled 2020-08-22: qty 1

## 2020-08-22 MED ORDER — LIDOCAINE 2% (20 MG/ML) 5 ML SYRINGE
INTRAMUSCULAR | Status: AC
  Filled 2020-08-22: qty 5

## 2020-08-22 MED ORDER — DEXAMETHASONE SODIUM PHOSPHATE 10 MG/ML IJ SOLN
INTRAMUSCULAR | Status: DC | PRN
  Administered 2020-08-22: 10 mg via INTRAVENOUS

## 2020-08-22 MED ORDER — ROCURONIUM BROMIDE 10 MG/ML (PF) SYRINGE
PREFILLED_SYRINGE | INTRAVENOUS | Status: AC
  Filled 2020-08-22: qty 10

## 2020-08-22 MED ORDER — TRAMADOL HCL 50 MG PO TABS
50.0000 mg | ORAL_TABLET | Freq: Once | ORAL | Status: AC
  Administered 2020-08-22: 50 mg via ORAL

## 2020-08-22 MED ORDER — ONDANSETRON HCL 4 MG/2ML IJ SOLN
INTRAMUSCULAR | Status: AC
  Filled 2020-08-22: qty 2

## 2020-08-22 MED ORDER — SODIUM CHLORIDE 0.9 % IR SOLN
Status: DC | PRN
  Administered 2020-08-22: 1000 mL

## 2020-08-22 MED ORDER — 0.9 % SODIUM CHLORIDE (POUR BTL) OPTIME
TOPICAL | Status: DC | PRN
  Administered 2020-08-22: 1000 mL

## 2020-08-22 MED ORDER — PROMETHAZINE HCL 25 MG/ML IJ SOLN: 6.2500 mg | INTRAMUSCULAR | Status: DC | PRN

## 2020-08-22 MED ORDER — BUPIVACAINE LIPOSOME 1.3 % IJ SUSP: 20.0000 mL | Freq: Once | INTRAMUSCULAR | Status: DC

## 2020-08-22 MED ORDER — INDOCYANINE GREEN 25 MG IV SOLR: 2.5000 mg | Freq: Once | INTRAVENOUS | Status: DC

## 2020-08-22 MED ORDER — CHLORHEXIDINE GLUCONATE 4 % EX LIQD: 60.0000 mL | Freq: Once | CUTANEOUS | Status: DC

## 2020-08-22 MED ORDER — FENTANYL CITRATE (PF) 250 MCG/5ML IJ SOLN
INTRAMUSCULAR | Status: DC | PRN
  Administered 2020-08-22: 100 ug via INTRAVENOUS

## 2020-08-22 MED ORDER — BUPIVACAINE-EPINEPHRINE 0.25% -1:200000 IJ SOLN
INTRAMUSCULAR | Status: DC | PRN
  Administered 2020-08-22: 23 mL

## 2020-08-22 MED ORDER — LIDOCAINE 2% (20 MG/ML) 5 ML SYRINGE
INTRAMUSCULAR | Status: DC | PRN
  Administered 2020-08-22: 100 mg via INTRAVENOUS

## 2020-08-22 MED ORDER — SUGAMMADEX SODIUM 200 MG/2ML IV SOLN
INTRAVENOUS | Status: DC | PRN
  Administered 2020-08-22 (×3): 100 mg via INTRAVENOUS

## 2020-08-22 MED ORDER — TRAMADOL HCL 50 MG PO TABS
ORAL_TABLET | ORAL | Status: AC
  Filled 2020-08-22: qty 1

## 2020-08-22 MED ORDER — FENTANYL CITRATE (PF) 100 MCG/2ML IJ SOLN: 25.0000 ug | INTRAMUSCULAR | Status: DC | PRN

## 2020-08-22 MED ORDER — PHENYLEPHRINE HCL-NACL 10-0.9 MG/250ML-% IV SOLN
INTRAVENOUS | Status: AC
  Filled 2020-08-22: qty 500

## 2020-08-22 MED ORDER — PROPOFOL 10 MG/ML IV BOLUS
INTRAVENOUS | Status: DC | PRN
  Administered 2020-08-22: 50 mg via INTRAVENOUS
  Administered 2020-08-22: 150 mg via INTRAVENOUS

## 2020-08-22 MED ORDER — MIDAZOLAM HCL 2 MG/2ML IJ SOLN
INTRAMUSCULAR | Status: AC
  Filled 2020-08-22: qty 2

## 2020-08-22 MED ORDER — SUGAMMADEX SODIUM 500 MG/5ML IV SOLN
INTRAVENOUS | Status: AC
  Filled 2020-08-22: qty 5

## 2020-08-22 MED ORDER — ROCURONIUM BROMIDE 10 MG/ML (PF) SYRINGE
PREFILLED_SYRINGE | INTRAVENOUS | Status: DC | PRN
  Administered 2020-08-22: 100 mg via INTRAVENOUS

## 2020-08-22 MED ORDER — CHLORHEXIDINE GLUCONATE 0.12 % MT SOLN
15.0000 mL | Freq: Once | OROMUCOSAL | Status: AC
  Administered 2020-08-22: 15 mL via OROMUCOSAL
  Filled 2020-08-22: qty 15

## 2020-08-22 MED ORDER — PROPOFOL 10 MG/ML IV BOLUS
INTRAVENOUS | Status: AC
  Filled 2020-08-22: qty 40

## 2020-08-22 MED ORDER — PHENYLEPHRINE 40 MCG/ML (10ML) SYRINGE FOR IV PUSH (FOR BLOOD PRESSURE SUPPORT)
PREFILLED_SYRINGE | INTRAVENOUS | Status: AC
  Filled 2020-08-22: qty 10

## 2020-08-22 MED ORDER — SCOPOLAMINE 1 MG/3DAYS TD PT72
1.0000 | MEDICATED_PATCH | TRANSDERMAL | Status: DC
  Administered 2020-08-22: 1.5 mg via TRANSDERMAL
  Filled 2020-08-22: qty 1

## 2020-08-22 MED ORDER — DOCUSATE SODIUM 100 MG PO CAPS: 100.0000 mg | ORAL_CAPSULE | Freq: Two times a day (BID) | ORAL | 0 refills | Status: AC

## 2020-08-22 MED ORDER — LACTATED RINGERS IV SOLN: INTRAVENOUS | Status: DC

## 2020-08-22 MED ORDER — CELECOXIB 200 MG PO CAPS
200.0000 mg | ORAL_CAPSULE | Freq: Once | ORAL | Status: AC
  Administered 2020-08-22: 200 mg via ORAL
  Filled 2020-08-22: qty 1

## 2020-08-22 MED ORDER — PHENYLEPHRINE HCL-NACL 10-0.9 MG/250ML-% IV SOLN
INTRAVENOUS | Status: DC | PRN
  Administered 2020-08-22: 80 ug/min via INTRAVENOUS

## 2020-08-22 SURGICAL SUPPLY — 43 items
ADH SKN CLS APL DERMABOND .7 (GAUZE/BANDAGES/DRESSINGS) ×1
APL PRP STRL LF DISP 70% ISPRP (MISCELLANEOUS) ×1
APPLIER CLIP 5 13 M/L LIGAMAX5 (MISCELLANEOUS) ×2
APR CLP MED LRG 5 ANG JAW (MISCELLANEOUS) ×1
BAG COUNTER SPONGE SURGICOUNT (BAG) ×2 IMPLANT
BAG SPEC RTRVL LRG 6X4 10 (ENDOMECHANICALS) ×1
BAG SPNG CNTER NS LX DISP (BAG) ×1
BLADE CLIPPER SURG (BLADE) IMPLANT
CANISTER SUCT 3000ML PPV (MISCELLANEOUS) ×2 IMPLANT
CHLORAPREP W/TINT 26 (MISCELLANEOUS) ×2 IMPLANT
CLIP APPLIE 5 13 M/L LIGAMAX5 (MISCELLANEOUS) ×1 IMPLANT
COVER SURGICAL LIGHT HANDLE (MISCELLANEOUS) ×2 IMPLANT
DERMABOND ADVANCED (GAUZE/BANDAGES/DRESSINGS) ×1
DERMABOND ADVANCED .7 DNX12 (GAUZE/BANDAGES/DRESSINGS) ×1 IMPLANT
ELECT REM PT RETURN 9FT ADLT (ELECTROSURGICAL) ×2
ELECTRODE REM PT RTRN 9FT ADLT (ELECTROSURGICAL) ×1 IMPLANT
GLOVE SURG ENC MOIS LTX SZ6 (GLOVE) ×2 IMPLANT
GLOVE SURG UNDER LTX SZ6.5 (GLOVE) ×3 IMPLANT
GLOVE SURG UNDER POLY LF SZ6 (GLOVE) ×1 IMPLANT
GOWN STRL REUS W/ TWL LRG LVL3 (GOWN DISPOSABLE) ×3 IMPLANT
GOWN STRL REUS W/TWL LRG LVL3 (GOWN DISPOSABLE) ×6
GRASPER SUT TROCAR 14GX15 (MISCELLANEOUS) ×2 IMPLANT
IRRIG SUCT STRYKERFLOW 2 WTIP (MISCELLANEOUS) ×2
IRRIGATION SUCT STRKRFLW 2 WTP (MISCELLANEOUS) IMPLANT
KIT BASIN OR (CUSTOM PROCEDURE TRAY) ×2 IMPLANT
KIT TURNOVER KIT B (KITS) ×2 IMPLANT
NDL INSUFFLATION 14GA 120MM (NEEDLE) ×1 IMPLANT
NEEDLE INSUFFLATION 14GA 120MM (NEEDLE) ×2 IMPLANT
NS IRRIG 1000ML POUR BTL (IV SOLUTION) ×2 IMPLANT
PAD ARMBOARD 7.5X6 YLW CONV (MISCELLANEOUS) ×2 IMPLANT
POUCH SPECIMEN RETRIEVAL 10MM (ENDOMECHANICALS) ×2 IMPLANT
SCISSORS LAP 5X35 DISP (ENDOMECHANICALS) ×2 IMPLANT
SET IRRIG TUBING LAPAROSCOPIC (IRRIGATION / IRRIGATOR) ×2 IMPLANT
SET TUBE SMOKE EVAC HIGH FLOW (TUBING) ×2 IMPLANT
SLEEVE ENDOPATH XCEL 5M (ENDOMECHANICALS) ×4 IMPLANT
SPECIMEN JAR SMALL (MISCELLANEOUS) ×2 IMPLANT
SUT MNCRL AB 4-0 PS2 18 (SUTURE) ×2 IMPLANT
TOWEL GREEN STERILE (TOWEL DISPOSABLE) IMPLANT
TOWEL GREEN STERILE FF (TOWEL DISPOSABLE) ×2 IMPLANT
TRAY LAPAROSCOPIC MC (CUSTOM PROCEDURE TRAY) ×2 IMPLANT
TROCAR XCEL NON-BLD 11X100MML (ENDOMECHANICALS) ×2 IMPLANT
TROCAR XCEL NON-BLD 5MMX100MML (ENDOMECHANICALS) ×2 IMPLANT
WATER STERILE IRR 1000ML POUR (IV SOLUTION) ×2 IMPLANT

## 2020-08-22 NOTE — Anesthesia Postprocedure Evaluation (Signed)
Anesthesia Post Note  [name]  Procedure(s) Performed: LAPAROSCOPIC CHOLECYSTECTOMY     Patient location during evaluation: PACU Anesthesia Type: General Level of consciousness: sedated Pain management: pain level controlled Vital Signs Assessment: post-procedure vital signs reviewed and stable Respiratory status: spontaneous breathing and respiratory function stable Cardiovascular status: stable Postop Assessment: no apparent nausea or vomiting Anesthetic complications: no   No notable events documented.  Last Vitals:  Vitals:   08/22/20 0935 08/22/20 0950  BP: 108/66 125/72  Pulse: 63   Resp: 20   Temp: 36.9 C   SpO2: 99%     Last Pain:  Vitals:   08/22/20 0945  TempSrc:   PainSc: 6                  Cecia Egge DANIEL

## 2020-08-22 NOTE — H&P (Signed)
Surgical Evaluation   Chief Complaint: abdominal pain   [id number] is a very pleasant 50 year old woman referred for evaluation of symptomatic gallstones and possible choledocholithiasis. She has a significant cardiac history, having seen multiple cardiologists over the last several years and ultimately diagnosed with microvascular disease.  Her current cardiologist is Dr. Amie Portland of [facility] in [place].    For the last several months, she has been experiencing epigastric and bilateral subcostal pain which radiates to the back, associated with occasional nausea and decreased appetite, bloating, pain is exacerbated by eating at times, pain is only occasionally relieved by nitroglycerin.  Many of these symptoms are similar to her previous angina equivalents and therefore the symptoms were confounded.  She had a cardiac PET on April 1 this year which noted abnormal wall motion and thickening of the apex, likely apical infarct with no evidence of ischemia, with abnormal myocardial blood flow reserve and overall consistent with microvascular disease and vasospasm.  Incidental findings included a left breast lesion and several large gallstones and possible common bile duct stone as well as liver cysts.  Report reviewed the imaging is not available.  Further cross sectional imaging was recommended but has not been completed at this point.  She is referred here in regards to the gallstones.   She has previously had a laparoscopic ovarian cystectomy and tubal ligation, but no other previous abdominal surgeries.        Allergies  Allergen Reactions   Zocor [Simvastatin]        Myalgia   Vicodin [Hydrocodone-Acetaminophen] Nausea And Vomiting          Past Medical History:  Diagnosis Date   Acid reflux     Anxiety     Asthmatic bronchitis      a. Typically when the weather changes.   Cholelithiasis     Coronary artery dissection      a. s/p LHC 08/17/13 SCAD which likely originated in the mid LAD and propagated  retrograde back to the ostium of the left main. Treated medically.   H/O varicella     Herniated disc, cervical     Hypotension      a. prev taken off BB due to BP 81W-29H systolic.   Ischemic cardiomyopathy      a. EF 35%, LV apical hypokinesia by cath 08/17/13 - f/u echo 08/2013 EF 50-55%.   Lung nodule      a. incidental finding on CT 08/2013. Needs follow up in 08/2014   Menstrual migraine     Microcytosis      a. Low ferritin 11/2013.   Monilial vaginitis 2005   Neuromuscular disorder (HCC)     Obesity, Class II, BMI 35-39.9     Ovarian cyst, left 2012   PMS (premenstrual syndrome)     PVC's (premature ventricular contractions)             Past Surgical History:  Procedure Laterality Date   COMBINED HYSTEROSCOPY DIAGNOSTIC / D&C   01/15/2015    [facility], Dr. Cletis Media   HYSTEROSCOPY W/ ENDOMETRIAL ABLATION   01/15/2015    [facility], Dr. Cletis Media   LAPAROSCOPIC TUBAL LIGATION   02/20/2011    Procedure: LAPAROSCOPIC TUBAL LIGATION;  Surgeon: Alwyn Pea, MD;  Location: [facility];  Service: Gynecology;  Laterality: Bilateral;  with Removal of Mirena and Robotic Assisted Bilateral Tubal Ligation   LEFT HEART CATHETERIZATION WITH CORONARY ANGIOGRAM Bilateral 08/17/2013    Procedure: LEFT HEART CATHETERIZATION WITH CORONARY ANGIOGRAM;  Surgeon: Jettie Booze,  MD;  Location: [facility];  Service: Cardiovascular;  Laterality: Bilateral;   ROBOTIC ASSISTED LAPAROSCOPIC OVARIAN CYSTECTOMY   02/2011   TUBAL LIGATION   02/20/2011   WISDOM TOOTH EXTRACTION               Family History  Problem Relation Age of Onset   Uterine cancer Mother     Breast cancer Paternal Aunt 47   Hypertension Father     Diabetes Father     Heart disease Father     Hypertension Brother     Hypertension Brother     Heart disease Paternal Grandmother        Social History         Socioeconomic History   Marital status: Married      Spouse name: Ovid Curd   Number of children: 2   Years of education: 17+    Highest education level: Not on file  Occupational History   Occupation: Clinical Social Work/Outpatient Psychologist, counselling: [employer]  Tobacco Use   Smoking status: Never Smoker   Smokeless tobacco: Never Used  Substance and Sexual Activity   Alcohol use: No   Drug use: No   Sexual activity: Yes      Partners: Male      Birth control/protection: Surgical  Other Topics Concern   Not on file  Social History Narrative    Lives with husband, daughter is in and out, son is in college (04/01/2017).    Social Determinants of Health    Financial Resource Strain: Not on file  Food Insecurity: Not on file  Transportation Needs: Not on file  Physical Activity: Not on file  Stress: Not on file  Social Connections: Not on file            Current Outpatient Medications on File Prior to Visit  Medication Sig Dispense Refill   aspirin 81 MG tablet Take 1 tablet (81 mg total) by mouth daily. 30 tablet 0   furosemide (LASIX) 20 MG tablet TAKE 1 TABLET BY MOUTH DAILY FOR 3 DAYS AND THEN AS NEEDED FOR SWELLING/SOB 30 tablet 6   isosorbide mononitrate (IMDUR) 60 MG 24 hr tablet TAKE 1 AND 1/2 TABLET BY MOUTH EVERY MORNING AND 1 TABLET EVERY EVENING 90 tablet 3   Multiple Vitamin (MULTIVITAMIN) tablet Take 1 tablet by mouth daily.       nitroGLYCERIN (NITROSTAT) 0.4 MG SL tablet PLACE 1 TABLET (0.4 MG TOTAL) UNDER THE TONGUE EVERY 5 (FIVE) MINUTES AS NEEDED FOR CHEST PAIN. 25 tablet 2   ranolazine (RANEXA) 1000 MG SR tablet Take 1 tablet (1,000 mg total) by mouth 2 (two) times daily. 60 tablet 11   rosuvastatin (CRESTOR) 20 MG tablet Take 1 tablet (20 mg total) by mouth daily. 90 tablet 3    No current facility-administered medications on file prior to visit.      Review of Systems:  General Present- Appetite Loss, Fatigue and Weight Gain. Not Present- Chills, Fever, Night Sweats and Weight Loss. Skin Not Present- Change in Wart/Mole, Dryness, Hives, Jaundice, New Lesions,  Non-Healing Wounds, Rash and Ulcer. HEENT Not Present- Earache, Hearing Loss, Hoarseness, Nose Bleed, Oral Ulcers, Ringing in the Ears, Seasonal Allergies, Sinus Pain, Sore Throat, Visual Disturbances, Wears glasses/contact lenses and Yellow Eyes. Respiratory Not Present- Bloody sputum, Chronic Cough, Difficulty Breathing, Snoring and Wheezing. Breast Not Present- Breast Mass, Breast Pain, Nipple Discharge and Skin Changes. Cardiovascular Present- Chest Pain, Palpitations, Rapid Heart Rate  and Shortness of Breath. Not Present- Difficulty Breathing Lying Down, Leg Cramps and Swelling of Extremities. Gastrointestinal Present- Abdominal Pain, Bloating, Indigestion and Nausea. Not Present- Bloody Stool, Change in Bowel Habits, Chronic diarrhea, Constipation, Difficulty Swallowing, Excessive gas, Gets full quickly at meals, Hemorrhoids, Rectal Pain and Vomiting. Female Genitourinary Not Present- Frequency, Nocturia, Painful Urination, Pelvic Pain and Urgency. Musculoskeletal Not Present- Back Pain, Joint Pain, Joint Stiffness, Muscle Pain, Muscle Weakness and Swelling of Extremities. Neurological Not Present- Decreased Memory, Fainting, Headaches, Numbness, Seizures, Tingling, Tremor, Trouble walking and Weakness. Psychiatric Not Present- Anxiety, Bipolar, Change in Sleep Pattern, Depression, Fearful and Frequent crying. Endocrine Not Present- Cold Intolerance, Excessive Hunger, Hair Changes, Heat Intolerance, Hot flashes and New Diabetes. Hematology Not Present- Blood Thinners, Easy Bruising, Excessive bleeding, Gland problems, HIV and Persistent Infections. All other systems negative   Physical Exam: Vitals Weight: 177 lb   Height: 59 in  Body Surface Area: 1.75 m   Body Mass Index: 35.75 kg/m   Temp.: 97.6 F    Pulse: 85 (Regular)    P.OX: 99% (Room air) BP: 110/64(Sitting, Left Arm, Standard)     Alert, well-appearing Unlabored respirations       CBC Latest Ref Rng & Units 07/02/2017  11/21/2015 12/04/2013  WBC 3.4 - 10.8 x10E3/uL 6.2 5.8 6.7  Hemoglobin 11.1 - 15.9 g/dL 13.9 14.0 13.3  Hematocrit 34.0 - 46.6 % 39.6 40.2 40.3  Platelets 150 - 379 x10E3/uL 289 266 260.0      CMP Latest Ref Rng & Units 09/21/2019 04/28/2018 11/21/2015  Glucose 65 - 99 mg/dL 100(H) 99 105(H)  BUN 6 - 24 mg/dL 12 9 9   Creatinine 0.57 - 1.00 mg/dL 0.91 0.83 0.89  Sodium 134 - 144 mmol/L 144 140 138  Potassium 3.5 - 5.2 mmol/L 4.7 4.4 4.5  Chloride 96 - 106 mmol/L 104 105 107  CO2 20 - 29 mmol/L 24 21 25   Calcium 8.7 - 10.2 mg/dL 10.8(H) 9.3 9.5  Total Protein 6.0 - 8.5 g/dL 6.6 - -  Total Bilirubin 0.0 - 1.2 mg/dL 0.4 - -  Alkaline Phos 48 - 121 IU/L 36(L) - -  AST 0 - 40 IU/L 12 - -  ALT 0 - 32 IU/L 13 - -      Recent Labs       Lab Results  Component Value Date    INR 0.96 08/17/2013        Imaging: Imaging Results (Last 48 hours)  No results found.       A/P:  CHOLEDOCHOLITHIASIS (K80.50) Story: Incidental, possibility noted on recent cardiac PET scan. I recommend further imaging with MRCP to definitively evaluate the common bile duct. Choledocholithiasis confirmed, I think it would be reasonable to attempt a coordinated cholecystectomy/ERCP.             Patient underwent MRCP 5/10 which confirms cholelithiasis up to 1 cm in size at the neck of the gallbladder, several cysts noted within both lobes of the liver up to 1.2 cm in diameter, no biliary ductal dilatation or choledocholithiasis. BILIARY COLIC (X54.00) Story: Her symptoms are consistent with biliary etiology. I do recommend ultimately proceeding with laparoscopic /robotic cholecystectomy with possible intraoperative cholangiogram. We went over the relevant anatomy and pathology using a diagram to demonstrate. Discussed risks of surgery including bleeding, pain, scarring, intraabdominal injury specifically to the common bile duct and sequelae, bile leak, conversion to open surgery, failure to resolve symptoms, blood  clots/ pulmonary embolus, heart attack, pneumonia, stroke, death. Questions  welcomed and answered to patient's satisfaction.  Plan to proceed with laparoscopic versus robotic cholecystectomy.           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

## 2020-08-22 NOTE — Discharge Instructions (Signed)
LAPAROSCOPIC SURGERY: POST OP INSTRUCTIONS   EAT Gradually transition to a high fiber diet with a fiber supplement over the next few weeks after discharge.  Start with a pureed / full liquid diet (see below)  WALK Walk an hour a day.  Control your pain to do that.    CONTROL PAIN Control pain so that you can walk, sleep, tolerate sneezing/coughing, go up/down stairs.  HAVE A BOWEL MOVEMENT DAILY Keep your bowels regular to avoid problems.  OK to try a laxative to override constipation.  OK to use an antidairrheal to slow down diarrhea.  Call if not better after 2 tries  CALL IF YOU HAVE PROBLEMS/CONCERNS Call if you are still struggling despite following these instructions. Call if you have concerns not answered by these instructions    DIET: Follow a light bland diet & liquids the first 24 hours after arrival home, such as soup, liquids, starches, etc.  Be sure to drink plenty of fluids.  Quickly advance to a usual solid diet within a few days.  Avoid fast food or heavy meals as your are more likely to get nauseated or have irregular bowels.  A low-sugar, high-fiber diet for the rest of your life is ideal.  Take your usually prescribed home medications unless otherwise directed.  PAIN CONTROL: Pain is best controlled by a usual combination of three different methods TOGETHER: Ice/Heat Over the counter pain medication Prescription pain medication Most patients will experience some swelling and bruising around the incisions.  Ice packs or heating pads (30-60 minutes up to 6 times a day) will help. Use ice for the first few days to help decrease swelling and bruising, then switch to heat to help relax tight/sore spots and speed recovery.  Some people prefer to use ice alone, heat alone, alternating between ice & heat.  Experiment to what works for you.  Swelling and bruising can take several weeks to resolve.   It is helpful to take an over-the-counter pain medication regularly for the  first few days: Naproxen (Aleve, etc)  Two 220mg  tabs twice a day OR Ibuprofen (Advil, etc) Three 200mg  tabs four times a day (every meal & bedtime) AND Acetaminophen (Tylenol, etc) 500-650mg  four times a day (every meal & bedtime) A  prescription for pain medication (such as oxycodone, hydrocodone, tramadol, gabapentin, methocarbamol, etc) should be given to you upon discharge.  Take your pain medication as prescribed, IF NEEDED.  If you are having problems/concerns with the prescription medicine (does not control pain, nausea, vomiting, rash, itching, etc), please call us [phone] to see if we need to switch you to a different pain medicine that will work better for you and/or control your side effect better. If you need a refill on your pain medication, please give [place] 48 hour notice.  contact your pharmacy.  They will contact our office to request authorization. Prescriptions will not be filled after 5 pm or on week-ends  Avoid getting constipated.   Between the surgery and the pain medications, it is common to experience some constipation.   Increasing fluid intake and taking a fiber supplement (such as Metamucil, Citrucel, FiberCon, MiraLax, etc) 1-2 times a day regularly will usually help prevent this problem from occurring.   A mild laxative (prune juice, Milk of Magnesia, MiraLax, etc) should be taken according to package directions if there are no bowel movements after 48 hours.   Watch out for diarrhea.   If you have many loose bowel movements, simplify your diet  to bland foods & liquids for a few days.   Stop any stool softeners and decrease your fiber supplement.   Switching to mild anti-diarrheal medications (Kayopectate, Pepto Bismol) can help.   If this worsens or does not improve, please call us.  Wash / shower every day.  You may shower over the skin glue which is waterproof  Glue will flake off after about 2 weeks.  You may leave the incision open to air.  You may replace a  dressing/Band-Aid to cover the incision for comfort if you wish.   ACTIVITIES as tolerated:   You may resume regular (light) daily activities beginning the next day--such as daily self-care, walking, climbing stairs--gradually increasing activities as tolerated.  If you can walk 30 minutes without difficulty, it is safe to try more intense activity such as jogging, treadmill, bicycling, low-impact aerobics, swimming, etc. Save the most intensive and strenuous activity for last such as sit-ups, heavy lifting, contact sports, etc  Refrain from any heavy lifting or straining until you are off narcotics for pain control.   DO NOT PUSH THROUGH PAIN.  Let pain be your guide: If it hurts to do something, don't do it.  Pain is your body warning you to avoid that activity for another week until the pain goes down. You may drive when you are no longer taking prescription pain medication, you can comfortably wear a seatbelt, and you can safely maneuver your car and apply brakes. You may have sexual intercourse when it is comfortable.  FOLLOW UP in our office Please call [facility] at [phone] to set up an appointment to see your surgeon in the office for a follow-up appointment approximately 2-3 weeks after your surgery. Make sure that you call for this appointment the day you arrive home to insure a convenient appointment time.  10. IF YOU HAVE DISABILITY OR FAMILY LEAVE FORMS, BRING THEM TO THE OFFICE FOR PROCESSING.  DO NOT GIVE THEM TO YOUR DOCTOR.   WHEN TO CALL us [phone]: Poor pain control Reactions / problems with new medications (rash/itching, nausea, etc)  Fever over 101.5 F (38.5 C) Inability to urinate Nausea and/or vomiting Worsening swelling or bruising Continued bleeding from incision. Increased pain, redness, or drainage from the incision   The clinic staff is available to answer your questions during regular business hours (8:30am-5pm).  Please don't hesitate to call and ask to  speak to one of our nurses for clinical concerns.   If you have a medical emergency, go to the nearest emergency room or call 911.  A surgeon from [facility] is always on call at the [facility], [place], [place], [place], [place]  [address] ? MAIN: [phone] ? TOLL FREE: [phone] ?  FAX [phone] [email or web address]

## 2020-08-22 NOTE — Anesthesia Procedure Notes (Signed)
Procedure Name: Intubation Date/Time: 08/22/2020 7:50 AM Performed by: Fulton Reek, CRNA Pre-anesthesia Checklist: Patient identified, Emergency Drugs available, Suction available and Patient being monitored Patient Re-evaluated:Patient Re-evaluated prior to induction Oxygen Delivery Method: Circle System Utilized Preoxygenation: Pre-oxygenation with 100% oxygen Induction Type: IV induction Ventilation: Mask ventilation without difficulty Laryngoscope Size: Mac and 3 Grade View: Grade I Tube type: Oral Tube size: 7.0 mm Number of attempts: 1 Airway Equipment and Method: Stylet Placement Confirmation: ETT inserted through vocal cords under direct vision, positive ETCO2 and breath sounds checked- equal and bilateral Secured at: 21 cm Tube secured with: Tape Dental Injury: Teeth and Oropharynx as per pre-operative assessment  Comments: Performed by Heide Scales, SRNA under direct supervision by Dr. Tobias Alexander and Fulton Reek, CRNA

## 2020-08-22 NOTE — Transfer of Care (Signed)
Immediate Anesthesia Transfer of Care Note  [name]  Procedure(s) Performed: LAPAROSCOPIC CHOLECYSTECTOMY  Patient Location: PACU  Anesthesia Type:General  Level of Consciousness: drowsy and patient cooperative  Airway & Oxygen Therapy: Patient Spontanous Breathing  Post-op Assessment: Report given to RN, Post -op Vital signs reviewed and stable and Patient moving all extremities  Post vital signs: Reviewed and stable  Last Vitals:  Vitals Value Taken Time  BP 113/80 08/22/20 0907  Temp    Pulse 69 08/22/20 0908  Resp 21 08/22/20 0908  SpO2 99 % 08/22/20 0908  Vitals shown include unvalidated device data.  Last Pain:  Vitals:   08/22/20 0606  TempSrc:   PainSc: 0-No pain      Patients Stated Pain Goal: 5 (78/67/67 2094)  Complications: No notable events documented.

## 2020-08-22 NOTE — Op Note (Signed)
Operative Note  [name] 50 y.o. female [id number]  08/22/2020  Surgeon: Clovis Riley MD FACS  Assistant: Lucas Mallow MD ([id number]). I was personally present during the key and critical portions of this procedure and immediately available throughout the entire procedure, as documented in my operative note.   Procedure performed: Laparoscopic Cholecystectomy  Preop diagnosis: biliary colic Post-op diagnosis/intraop findings: same, chronic cholecystitis  Specimens: gallbladder  Retained items: none  EBL: minimal  Complications: none  Description of procedure: After obtaining informed consent the patient was brought to the operating room. Antibiotics were administered. SCD's were applied. General endotracheal anesthesia was initiated and a formal time-out was performed. The abdomen was prepped and draped in the usual sterile fashion and the abdomen was entered using an infraumbilical veress needle after instilling the site with local. Insufflation to 65mmHg was obtained, 68mm trocar and camera inserted, and gross inspection revealed no evidence of injury from our entry or other intraabdominal abnormalities. Two 81mm trocars were introduced in the right midclavicular and right anterior axillary lines under direct visualization and following infiltration with local. An 42mm trocar was placed in the epigastrium. The gallbladder was retracted cephalad and the infundibulum was retracted laterally. A combination of hook electrocautery and blunt dissection was utilized to clear the peritoneum from the neck and cystic duct, circumferentially isolating the cystic artery and cystic duct and lifting the gallbladder from the cystic plate. The critical view of safety was achieved with the cystic artery, cystic duct, and liver bed visualized between them with no other structures. The artery was clipped with two clips proximally and one distally and divided as was the cystic duct with three clips on the  proximal end. The gallbladder was dissected from the liver plate using electrocautery. A posterior branch cystic artery was encountered and ligated with clips before being divided with cautery. Once freed the gallbladder was placed in an endocatch bag and removed intact through the epigastric trocar site. A small amount of bleeding on the liver bed was controlled with cautery. This was aspirated and the right upper quadrant was irrigated and aspirated; the effluent was clear. Hemostasis was once again confirmed, and reinspection of the abdomen revealed no injuries. The clips were well opposed without any bile leak from the duct or the liver bed. The 53mm trocar site in the epigastrium was closed with a 0 vicryl in the fascia under direct visualization using a PMI device. The abdomen was desufflated and all trocars removed. The skin incisions were closed with subcuticular 4-0 monocryl and Dermabond. The patient was awakened, extubated and transported to the recovery room in stable condition.    All counts were correct at the completion of the case.

## 2020-08-23 ENCOUNTER — Encounter (HOSPITAL_COMMUNITY): Payer: Self-pay | Admitting: Surgery

## 2020-08-23 LAB — SURGICAL PATHOLOGY

## 2020-08-28 ENCOUNTER — Ambulatory Visit (HOSPITAL_BASED_OUTPATIENT_CLINIC_OR_DEPARTMENT_OTHER): Payer: Commercial Managed Care - PPO | Admitting: Cardiovascular Disease

## 2021-02-16 HISTORY — PX: ANKLE FRACTURE SURGERY: SHX122

## 2021-03-26 ENCOUNTER — Other Ambulatory Visit: Payer: Self-pay | Admitting: Obstetrics and Gynecology

## 2021-03-26 DIAGNOSIS — R928 Other abnormal and inconclusive findings on diagnostic imaging of breast: Secondary | ICD-10-CM

## 2021-05-07 ENCOUNTER — Telehealth: Payer: Self-pay | Admitting: Cardiovascular Disease

## 2021-05-07 NOTE — Telephone Encounter (Signed)
Patient states she has changed cardiologists and will not be scheduling with Dr. Oval Linsey in the future. ?

## 2021-07-03 ENCOUNTER — Other Ambulatory Visit: Payer: Self-pay | Admitting: Obstetrics and Gynecology

## 2021-07-03 DIAGNOSIS — N939 Abnormal uterine and vaginal bleeding, unspecified: Secondary | ICD-10-CM

## 2021-07-15 ENCOUNTER — Other Ambulatory Visit: Payer: Self-pay

## 2021-07-15 ENCOUNTER — Encounter: Payer: Self-pay | Admitting: Emergency Medicine

## 2021-07-15 ENCOUNTER — Ambulatory Visit
Admission: EM | Admit: 2021-07-15 | Discharge: 2021-07-15 | Disposition: A | Discharge status: Home / Self Care | Payer: BC Managed Care – PPO | Attending: Family Medicine | Admitting: Family Medicine

## 2021-07-15 DIAGNOSIS — J3089 Other allergic rhinitis: Secondary | ICD-10-CM

## 2021-07-15 DIAGNOSIS — J209 Acute bronchitis, unspecified: Secondary | ICD-10-CM

## 2021-07-15 MED ORDER — PREDNISONE 20 MG PO TABS: 40.0000 mg | ORAL_TABLET | Freq: Every day | ORAL | 0 refills | Status: DC

## 2021-07-15 MED ORDER — PROMETHAZINE-DM 6.25-15 MG/5ML PO SYRP: 5.0000 mL | ORAL_SOLUTION | Freq: Four times a day (QID) | ORAL | 0 refills | Status: DC | PRN

## 2021-07-15 MED ORDER — CETIRIZINE HCL 10 MG PO TABS: 10.0000 mg | ORAL_TABLET | Freq: Every day | ORAL | 2 refills | Status: DC

## 2021-07-15 MED ORDER — FLUTICASONE PROPIONATE 50 MCG/ACT NA SUSP: 1.0000 | Freq: Two times a day (BID) | NASAL | 2 refills | Status: DC

## 2021-07-15 MED ORDER — ALBUTEROL SULFATE HFA 108 (90 BASE) MCG/ACT IN AERS: 1.0000 | INHALATION_SPRAY | Freq: Four times a day (QID) | RESPIRATORY_TRACT | 0 refills | Status: DC | PRN

## 2021-07-15 NOTE — ED Provider Notes (Signed)
[facility]    CSN: [id number] Arrival date & time: 07/15/21  1826      History   Chief Complaint Chief Complaint  Patient presents with   Cough    HPI [name] is a 51 y.o. female.   Presenting today with about a week of cough, congestion, sore throat, sinus pressure that is now settled into her chest.  Chest tightness, wheezing, occasional bouts of shortness of breath with coughing spells.  States she is very prone to bronchitis and it typically goes like this.  Denies fever, chills, chest pain, significant shortness of breath, abdominal pain, nausea vomiting or diarrhea.  Trying Alka-Seltzer cold and sinus and Sudafed with minimal relief.  History of seasonal allergies not currently on anything for this.   Past Medical History:  Diagnosis Date   Acid reflux    Anxiety    Asthmatic bronchitis    a. Typically when the weather changes.   Cholelithiasis    Coronary artery dissection    a. s/p LHC 08/17/13 SCAD which likely originated in the mid LAD and propagated retrograde back to the ostium of the left main. Treated medically.    H/O varicella    Herniated disc, cervical    Hypotension    a. prev taken off BB due to BP 64P-32R systolic.   Ischemic cardiomyopathy    a. EF 35%, LV apical hypokinesia by cath 08/17/13 - f/u echo 08/2013 EF 50-55%.   Lung nodule    a. incidental finding on CT 08/2013. Needs follow up in 08/2014   Menstrual migraine    Microcytosis    a. Low ferritin 11/2013.   Monilial vaginitis 2005   Myocardial infarction ([place])    7-5 2015   Neuromuscular disorder ([place])    herniated disc c5,c6   Obesity, Class II, BMI 35-39.9    Ovarian cyst, left 2012   PMS (premenstrual syndrome)    PVC's (premature ventricular contractions)     Patient Active Problem List   Diagnosis Date Noted   Shortness of breath 11/18/2015   Obesity, Class II, BMI 35-39.9    DOE (dyspnea on exertion) 05/17/2015   Chest pain 05/17/2015   Old MI (myocardial  infarction) 01/23/2014   Microcytosis    PVC's (premature ventricular contractions)    Asthmatic bronchitis    LV dysfunction 08/22/2013   Lung nodule    Coronary artery dissection 08/17/2013   Acute myocardial infarction of other anterior wall, initial episode of care 08/17/2013   Cervicalgia 05/16/2012   Herniated disc, cervical    Migraine 12/31/2011   Left ovarian cyst 02/20/2011    Past Surgical History:  Procedure Laterality Date   ANKLE FRACTURE SURGERY Left 2023   CHOLECYSTECTOMY N/A 08/22/2020   Procedure: LAPAROSCOPIC CHOLECYSTECTOMY;  Surgeon: Clovis Riley, MD;  Location: [facility];  Service: General;  Laterality: N/A;   COMBINED HYSTEROSCOPY DIAGNOSTIC / D&C  01/15/2015   [facility], Dr. Cletis Media   HYSTEROSCOPY W/ ENDOMETRIAL ABLATION  01/15/2015   [facility], Dr. Cletis Media   LAPAROSCOPIC TUBAL LIGATION  02/20/2011   Procedure: LAPAROSCOPIC TUBAL LIGATION;  Surgeon: Alwyn Pea, MD;  Location: [facility];  Service: Gynecology;  Laterality: Bilateral;  with Removal of Mirena and Robotic Assisted Bilateral Tubal Ligation   LEFT HEART CATHETERIZATION WITH CORONARY ANGIOGRAM Bilateral 08/17/2013   Procedure: LEFT HEART CATHETERIZATION WITH CORONARY ANGIOGRAM;  Surgeon: Jettie Booze, MD;  Location: [facility] CATH LAB;  Service: Cardiovascular;  Laterality: Bilateral;   ROBOTIC ASSISTED LAPAROSCOPIC OVARIAN CYSTECTOMY  02/17/2011   TUBAL LIGATION  02/20/2011   WISDOM TOOTH EXTRACTION      OB History     Gravida  3   Para  2   Term      Preterm      AB      Living         SAB      IAB      Ectopic      Multiple      Live Births               Home Medications    Prior to Admission medications   Medication Sig Start Date End Date Taking? Authorizing Provider  albuterol (VENTOLIN HFA) 108 (90 Base) MCG/ACT inhaler Inhale 1-2 puffs into the lungs every 6 (six) hours as needed for wheezing or shortness of breath. 07/15/21  Yes Volney American, PA-C   cetirizine (ZYRTEC ALLERGY) 10 MG tablet Take 1 tablet (10 mg total) by mouth daily. 07/15/21  Yes Volney American, PA-C  fluticasone [facility]) 50 MCG/ACT nasal spray Place 1 spray into both nostrils 2 (two) times daily. 07/15/21  Yes Volney American, PA-C  furosemide (LASIX) 20 MG tablet TAKE 1 TABLET BY MOUTH DAILY FOR 3 DAYS AND THEN AS NEEDED FOR SWELLING/SOB Patient taking differently: Take 20 mg by mouth daily as needed (swelling or shortness of breath). 10/24/19  Yes Skeet Latch, MD  isosorbide mononitrate (IMDUR) 60 MG 24 hr tablet TAKE 1 AND 1/2 TABLET BY MOUTH EVERY MORNING AND 1 TABLET EVERY EVENING Patient taking differently: Take 120 mg by mouth in the morning and at bedtime. 12/15/19  Yes Skeet Latch, MD  Multiple Vitamin (MULTIVITAMIN) tablet Take 1 tablet by mouth daily.   Yes [provider]  nitroGLYCERIN (NITROSTAT) 0.4 MG SL tablet PLACE 1 TABLET (0.4 MG TOTAL) UNDER THE TONGUE EVERY 5 (FIVE) MINUTES AS NEEDED FOR CHEST PAIN. 06/05/20  Yes Skeet Latch, MD  predniSONE (DELTASONE) 20 MG tablet Take 2 tablets (40 mg total) by mouth daily with breakfast. 07/15/21  Yes Volney American, PA-C  promethazine-dextromethorphan (PROMETHAZINE-DM) 6.25-15 MG/5ML syrup Take 5 mLs by mouth 4 (four) times daily as needed. 07/15/21  Yes Volney American, PA-C  ranolazine (RANEXA) 1000 MG SR tablet Take 1 tablet (1,000 mg total) by mouth 2 (two) times daily. 11/24/19  Yes Skeet Latch, MD  aspirin 81 MG tablet Take 1 tablet (81 mg total) by mouth daily. 01/22/14   Jettie Booze, MD    Family History Family History  Problem Relation Age of Onset   Uterine cancer Mother    Breast cancer Paternal Aunt 45   Hypertension Father    Diabetes Father    Heart disease Father    Hypertension Brother    Hypertension Brother    Heart disease Paternal Grandmother     Social History Social History   Tobacco Use   Smoking status: Never    Smokeless tobacco: Never  Vaping Use   Vaping Use: Never used  Substance Use Topics   Alcohol use: No   Drug use: No     Allergies   Hydrocodone and Statins   Review of Systems Review of Systems Per HPI  Physical Exam Triage Vital Signs ED Triage Vitals [07/15/21 1833]  Enc Vitals Group     BP 109/72     Pulse Rate 82     Resp 18     Temp 98.9 F (37.2 C)  Temp Source Oral     SpO2 96 %     Weight 177 lb (80.3 kg)     Height '4\' 11"'$  (1.499 m)     Head Circumference      Peak Flow      Pain Score 0     Pain Loc      Pain Edu?      Excl. in [place]?    No data found.  Updated Vital Signs BP 109/72 (BP Location: Right Arm)   Pulse 82   Temp 98.9 F (37.2 C) (Oral)   Resp 18   Ht '4\' 11"'$  (1.499 m)   Wt 177 lb (80.3 kg)   LMP 07/10/2021 (Approximate)   SpO2 96%   BMI 35.75 kg/m   Visual Acuity Right Eye Distance:   Left Eye Distance:   Bilateral Distance:    Right Eye Near:   Left Eye Near:    Bilateral Near:     Physical Exam Vitals and nursing note reviewed.  Constitutional:      Appearance: Normal appearance.  HENT:     Head: Atraumatic.     Right Ear: Tympanic membrane and external ear normal.     Left Ear: Tympanic membrane and external ear normal.     Nose: Rhinorrhea present.     Mouth/Throat:     Mouth: Mucous membranes are moist.     Pharynx: Posterior oropharyngeal erythema present.  Eyes:     Extraocular Movements: Extraocular movements intact.     Conjunctiva/sclera: Conjunctivae normal.  Cardiovascular:     Rate and Rhythm: Normal rate and regular rhythm.     Heart sounds: Normal heart sounds.  Pulmonary:     Effort: Pulmonary effort is normal.     Breath sounds: Wheezing present. No rales.     Comments: Trace wheezes bilateral bases Musculoskeletal:        General: Normal range of motion.     Cervical back: Normal range of motion and neck supple.  Skin:    General: Skin is warm and dry.  Neurological:     Mental Status: She  is alert and oriented to person, place, and time.  Psychiatric:        Mood and Affect: Mood normal.        Thought Content: Thought content normal.     UC Treatments / Results  Labs (all labs ordered are listed, but only abnormal results are displayed) Labs Reviewed - No data to display  EKG   Radiology No results found.  Procedures Procedures (including critical care time)  Medications Ordered in UC Medications - No data to display  Initial Impression / Assessment and Plan / UC Course  I have reviewed the triage vital signs and the nursing notes.  Pertinent labs & imaging results that were available during my care of the patient were reviewed by me and considered in my medical decision making (see chart for details).     Treat with prednisone, Phenergan DM, albuterol inhaler for bronchitis and start good allergy regimen with Zyrtec and Flonase.  Supportive care and return precautions reviewed.  Final Clinical Impressions(s) / UC Diagnoses   Final diagnoses:  Acute bronchitis, unspecified organism  Seasonal allergic rhinitis due to other allergic trigger   Discharge Instructions   None    ED Prescriptions     Medication Sig Dispense Auth. Provider   cetirizine (ZYRTEC ALLERGY) 10 MG tablet Take 1 tablet (10 mg total) by mouth daily. 30 tablet Merrie Roof  Elizabeth, PA-C   fluticasone [facility]) 50 MCG/ACT nasal spray Place 1 spray into both nostrils 2 (two) times daily. 16 g Volney American, [place]   predniSONE (DELTASONE) 20 MG tablet Take 2 tablets (40 mg total) by mouth daily with breakfast. 10 tablet Volney American, PA-C   promethazine-dextromethorphan (PROMETHAZINE-DM) 6.25-15 MG/5ML syrup Take 5 mLs by mouth 4 (four) times daily as needed. 100 mL Volney American, PA-C   albuterol (VENTOLIN HFA) 108 (90 Base) MCG/ACT inhaler Inhale 1-2 puffs into the lungs every 6 (six) hours as needed for wheezing or shortness of breath. 18 g Volney American, [place]      PDMP not reviewed this encounter.   Volney American, [place] 07/15/21 2007

## 2021-07-15 NOTE — ED Triage Notes (Signed)
Pt reports cough and chest congestion since last Thursday. Pt denies any known fevers and reports "symptoms started in my head but have now settled in my chest." Recurrent hx of bronchitis.

## 2021-12-17 ENCOUNTER — Encounter (HOSPITAL_BASED_OUTPATIENT_CLINIC_OR_DEPARTMENT_OTHER): Payer: Self-pay | Admitting: Obstetrics and Gynecology

## 2021-12-18 ENCOUNTER — Encounter (HOSPITAL_BASED_OUTPATIENT_CLINIC_OR_DEPARTMENT_OTHER): Payer: Self-pay | Admitting: Obstetrics and Gynecology

## 2021-12-18 ENCOUNTER — Other Ambulatory Visit: Payer: Self-pay

## 2021-12-18 NOTE — Progress Notes (Addendum)
Spoke w/ via phone for pre-op interview---[name] needs dos----urine pregnancy per anesthesia, surgeon orders pending as of 12/18/2021               Mosley results------12/23/21 Mosley appt for cbc, type & screen, bmp, ekg COVID test -----patient states asymptomatic no test needed Arrive at -------1000 on Thursday, 12/25/2021 NPO after MN NO Solid Food.  Clear liquids from MN until---0900 Med rec completed Medications to take morning of surgery -----Imdur, Ranexa, Norvasc, NTG patch Diabetic medication -----n/a Patient instructed no nail polish to be worn day of surgery Patient instructed to bring photo id and insurance card day of surgery Patient aware to have Driver (ride ) / caregiver    for 24 hours after surgery - husband, [name] Patient Special Instructions -----Extended/ overnight stay instructions given. Drink two Ensure drinks the night before surgery, finishing by 10 pm. Drink one Ensure drink on the morning of surgery, finishing by 0900. Pre-Op special Istructions -----Please ask MDA if patient should remove NTG patch before surgery. Requested orders from Dr. Cletis Media via Epic IB on 12/16/21. Patient verbalized understanding of instructions that were given at this phone interview. Patient denies fever, cough at this phone interview. Patient states that she does have occasional chest pain and SOB (see below.)  Patient has a history of presumed scad of LAD and MI in 2015 s/p left heart catherization. Please see LOV dated 07/03/21 with cardiologist, Dr. Al Pimple in [place] & chart. Patient also has a hx of chronic angina and coronary vasospasms. She wears a NTG patch and takes Imdur and Ranexa as well as  SL nitroglycerin for occasional breakthrough chest pain and SOB. Cardiac clearance was given by Dr. Amie Portland on 11/05/2021 (placed in chart.) 05/2020 PET Myocardial Perfusion in Care Everywhere & chart demonstrated normal left ventricular systolic function with evidence of apical infarct and no  ischemia. There was abnormal blood flow reserve and findings consistent with microvascular disease and vasospasm per 07/03/21 MD note by Dr. Amie Portland. 04/08/21 EKG (no tracing) in CE & chart 03/19/21 Holter Monitor results in CE & chart

## 2021-12-18 NOTE — H&P (Signed)
[name] is a 51 y.o. female P: 2-0-1-2 who presents for hysterectomy because of abnormal uterine bleeding and uterine fibroids.  The patient has a long history of heavy and irregular bleeding that was managed well following a Novasure endometrial ablation in 2016.   Following that procedure, her  menses was described as mostly spotting for 7-14 days each month with no cramping.  In January of  this year however, the patient was placed on aspirin following surgery for left ankle fracture.  Subsequently she developed daily vaginal bleeding and  cramping (rated 5/10)  that lasted until March 2023 when she stopped the aspirin. Though she would pass clots, her bleeding was not described as particularly heavy so she only needed to change her regular pad twice a day.  At that time her TSH and CBC were normal with a  pelvic ultrasound revealing a uterus: volume-169 cc, endometrium: 11.38 mm and #4 measurable intramural fibroids: anterior fundal-2.4 cm, right posterior-2.2 cm, anterior left-1.7 cm and anterior mid-1.5 cm;  her left ovary was not seen and right ovary had a 4.5 cm simple cyst and 3 cm hemorrhagic appearing  cyst.  Over time the patient noticed increased vaginal bleeding,  especially with exercise, following intercourse or with any significant physical activity. Once a cervical polyp was discovered and removed however, that bleeding pattern diminished.  The patient was also  placed on norethindrone 5 mg daily that caused her bleeding to require only #2 panty liners a day.  In spite of this decrease in bleeding volume, she reports daily mild cramping that doesn't require  analgesia. She  denies any changes in bowel or bladder function, dyspareunia or vaginitis symptoms.  The patient was given both medical and surgical management options for her abnormal bleeding however, she has chosen to proceed with definitive therapy in the form of hysterectomy.  Medical Clearance was received from the patient's  Cardiologist Dr.  Vance Peper Baxi.     Past Medical History  OB History: G: 3;  P: 2-0-1-2; SVB: 1995 and 2000  (largest infant weighed 7 lbs  10 oz)  GYN History: menarche: 51 YO;  LMP: 12/02/2021  Contracepton bilateral tubal ligation  The patient denies history of sexually transmitted disease.  Denies history of abnormal PAP smear.  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   Medications: Amlodipine 2.5 mg daily Furosemide 20 mg daily Isosorbide Mononitrate ER 120 mg Nitroglycerina 0.4 mg sublingual prn Norethindrone 5 mg daily Nitroglycerine 0.4 mg Patch as directed Ranolazine ER 1,000 mg bid  Allergies  Allergen Reactions   Hydrocodone Nausea And Vomiting    Many years ago   Statins Other (See Comments)    Myalgias with simvastatin and rosuvastatin   Denies sensitivity to peanuts, shellfish, soy,  or latex, though some adhesives/glue cause significant rash.  ROS: Admits to contact lenses but denies headache, removable dental ware, vision changes, nasal congestion, dysphagia, tinnitus, dizziness, hoarseness, cough,  chest pain, shortness of breath, nausea, vomiting, diarrhea,constipation,  urinary frequency, urgency  dysuria, hematuria, vaginitis symptoms, pelvic  pain, swelling of joints,easy bruising,  myalgias, arthralgias, skin rashes, unexplained weight loss and except as is mentioned in the history of present illness,  patient's review of systems is otherwise negative.    Physical Exam  Bp: 116/76;  Weight: 179 lbs.; Height: 4'11";  BMI: 36.2  Neck: supple without masses or thyromegaly Lungs: clear to auscultation Heart: regular rate and rhythm Abdomen: soft, non-tender and no organomegaly Pelvic:EGBUS- wnl; vagina-normal rugae; uterus-normal size, cervix without lesions or motion tenderness; adnexae-no tenderness or masses Extremities:  no clubbing, cyanosis or edema   Assesment: Abnormal Uterine Bleeding                      Uterine Fibroids   Disposition:  The robot-assisted hysterectomy was reviewed with the patient along with the indication for her procedure. Benefits of the robotic approach include lesser postoperative pain, less blood loss during surgery, reduced risk of injury to other organs, due to better visualization with a 3-D HD 10 times magnifying camera; shorter hospital stay between 0-1 night and rapid recovery with return to daily routine in 2-3 weeks (however, nothing in the vagina for 6 weeks). Although the robot-assisted hysterectomy has a longer operative time than traditional laparotomy, the benefits usually outweigh the risks, in a patient with good medical history,   Risks include bleeding, infection, injury to other organs (bladder more vulnerable if previous cesarean delivery) , need for laparotomy, transient post-operative facial edema, increased risk of pelvic prolapse (associated with any hysterectomy) as well as earlier onset of menopause. Preservation of the ovaries was also reviewed and recommended. Finally, we recommended bilateral salpingectomy for lifelong reduction of ovarian cancer. A Miralax Bowel Prep was given to the patient to complete the day before her surgery.  The patient's questions were answered and she verbalized understanding of the risks and pre-operative instructions. The patient has consented to proceed with a Robot Assisted Laparoscopic Hysterectomy with  Bilateral Salpingectomy at [facility] on December 25, 2021 @ 8 a.m.  CSN# [id number]   Chadwick Reiswig J. Florene Glen, PA-C  for Dr. Dede Query. [place]

## 2021-12-18 NOTE — Progress Notes (Signed)
Your procedure is scheduled on Thursday, 12/25/21.  Report to [facility]   Call this number if you have problems the morning of surgery  :[phone].   OUR ADDRESS IS [address].  WE ARE LOCATED IN [facility].  PLEASE BRING YOUR INSURANCE CARD AND PHOTO ID DAY OF SURGERY.  ONLY 2 PEOPLE ARE ALLOWED IN  WAITING  ROOM.                                      REMEMBER:  DO NOT EAT FOOD, CANDY GUM OR MINTS  AFTER MIDNIGHT THE NIGHT BEFORE YOUR SURGERY . YOU MAY HAVE CLEAR LIQUIDS FROM MIDNIGHT THE NIGHT BEFORE YOUR SURGERY UNTIL  9:00 AM. NO CLEAR LIQUIDS AFTER   9:00 AM DAY OF SURGERY.  YOU MAY  BRUSH YOUR TEETH MORNING OF SURGERY AND RINSE YOUR MOUTH OUT, NO CHEWING GUM CANDY OR MINTS.     CLEAR LIQUID DIET   Foods Allowed                                                                     Foods Excluded  Coffee and tea, regular and decaf                             liquids that you cannot  Plain Jell-O                                                                   see through such as: Fruit ices (not with fruit pulp)                                     milk, soups, orange juice  Plain  Popsicles                                    All solid food Carbonated beverages, regular and diet                                    Cranberry, grape and apple juices Sports drinks like Gatorade _____________________________________________________________________     TAKE ONLY THESE MEDICATIONS MORNING OF SURGERY: Imdur, Ranexa, Amlodipine, NTG patch  If emergency NTG tablet is needed day of surgery go ahead and take it. However, anesthesia will evaluate day of surgery.    UP TO 4 VISITORS  MAY VISIT IN THE EXTENDED RECOVERY ROOM UNTIL 800 PM ONLY.  ONE  VISITOR AGE 62 AND OVER MAY SPEND THE NIGHT AND MUST BE IN EXTENDED RECOVERY ROOM NO LATER THAN 800 PM . YOUR DISCHARGE TIME AFTER  YOU SPEND THE NIGHT IS 900 AM THE MORNING  AFTER YOUR SURGERY.  YOU MAY PACK A SMALL OVERNIGHT BAG WITH TOILETRIES FOR YOUR OVERNIGHT STAY IF YOU WISH.  YOUR PRESCRIPTION MEDICATIONS WILL BE PROVIDED DURING [place].                                      DO NOT WEAR JEWERLY, MAKE UP. DO NOT WEAR LOTIONS, POWDERS, PERFUMES OR NAIL POLISH ON YOUR FINGERNAILS. TOENAIL POLISH IS OK TO WEAR. DO NOT SHAVE FOR 48 HOURS PRIOR TO DAY OF SURGERY. MEN MAY SHAVE FACE AND NECK. CONTACTS, GLASSES, OR DENTURES MAY NOT BE WORN TO SURGERY.  REMEMBER: NO SMOKING, DRUGS OR ALCOHOL FOR 24 HOURS BEFORE YOUR SURGERY.                                    [facility] IS NOT RESPONSIBLE  FOR ANY BELONGINGS.                                                                    Marland Kitchen           [facility] - Preparing for Surgery Before surgery, you can play an important role.  Because skin is not sterile, your skin needs to be as free of germs as possible.  You can reduce the number of germs on your skin by washing with CHG (chlorahexidine gluconate) soap before surgery.  CHG is an antiseptic cleaner which kills germs and bonds with the skin to continue killing germs even after washing. Please DO NOT use if you have an allergy to CHG or antibacterial soaps.  If your skin becomes reddened/irritated stop using the CHG and inform your nurse when you arrive at Short Stay. Do not shave (including legs and underarms) for at least 48 hours prior to the first CHG shower.  You may shave your face/neck. Please follow these instructions carefully:  1.  Shower with CHG Soap the night before surgery and the  morning of Surgery.  2.  If you choose to wash your hair, wash your hair first as usual with your  normal  shampoo.  3.  After you shampoo, rinse your hair and body thoroughly to remove the  shampoo.                                        4.  Use CHG as you would any other liquid soap.  You can apply chg directly  to the skin and wash , chg soap provided, night before  and morning of your surgery.  5.  Apply the CHG Soap to your body ONLY FROM THE NECK DOWN.   Do not use on face/ open                           Wound or open sores. Avoid contact with eyes, ears mouth and genitals (private parts).  Wash face,  Genitals (private parts) with your normal soap.             6.  Wash thoroughly, paying special attention to the area where your surgery  will be performed.  7.  Thoroughly rinse your body with warm water from the neck down.  8.  DO NOT shower/wash with your normal soap after using and rinsing off  the CHG Soap.             9.  Pat yourself dry with a clean towel.            10.  Wear clean pajamas.            11.  Place clean sheets on your bed the night of your first shower and do not  sleep with pets. Day of Surgery : Do not apply any lotions/deodorants the morning of surgery.  Please wear clean clothes to the hospital/surgery center.  IF YOU HAVE ANY SKIN IRRITATION OR PROBLEMS WITH THE SURGICAL SOAP, PLEASE GET A BAR OF GOLD DIAL SOAP AND SHOWER THE NIGHT BEFORE YOUR SURGERY AND THE MORNING OF YOUR SURGERY. PLEASE LET THE NURSE KNOW MORNING OF YOUR SURGERY IF YOU HAD ANY PROBLEMS WITH THE SURGICAL SOAP.   ________________________________________________________________________                                                        QUESTIONS [place] [facility] PRE OP NURSE PHONE [phone].

## 2021-12-23 ENCOUNTER — Encounter (HOSPITAL_COMMUNITY)
Admission: RE | Admit: 2021-12-23 | Discharge: 2021-12-23 | Disposition: A | Discharge status: Home / Self Care | Payer: BC Managed Care – PPO | Source: Ambulatory Visit | Attending: Obstetrics and Gynecology | Admitting: Obstetrics and Gynecology

## 2021-12-23 DIAGNOSIS — Z01818 Encounter for other preprocedural examination: Secondary | ICD-10-CM | POA: Insufficient documentation

## 2021-12-23 DIAGNOSIS — I519 Heart disease, unspecified: Secondary | ICD-10-CM | POA: Insufficient documentation

## 2021-12-23 LAB — CBC
HCT: 40.5 % (ref 36.0–46.0)
Hemoglobin: 14.5 g/dL (ref 12.0–15.0)
MCH: 27.8 pg (ref 26.0–34.0)
MCHC: 35.8 g/dL (ref 30.0–36.0)
MCV: 77.7 fL — ABNORMAL LOW (ref 80.0–100.0)
Platelets: 331 10*3/uL (ref 150–400)
RBC: 5.21 MIL/uL — ABNORMAL HIGH (ref 3.87–5.11)
RDW: 13.2 % (ref 11.5–15.5)
WBC: 5.7 10*3/uL (ref 4.0–10.5)
nRBC: 0 % (ref 0.0–0.2)

## 2021-12-23 LAB — BASIC METABOLIC PANEL
Anion gap: 15 (ref 5–15)
BUN: 12 mg/dL (ref 6–20)
CO2: 21 mmol/L — ABNORMAL LOW (ref 22–32)
Calcium: 9.5 mg/dL (ref 8.9–10.3)
Chloride: 105 mmol/L (ref 98–111)
Creatinine, Ser: 1 mg/dL (ref 0.44–1.00)
GFR, Estimated: >60 mL/min (ref >60)
Glucose, Bld: 104 mg/dL — ABNORMAL HIGH (ref 70–99)
Potassium: 3.7 mmol/L (ref 3.5–5.1)
Sodium: 141 mmol/L (ref 135–145)

## 2021-12-23 NOTE — Progress Notes (Signed)
Patient called in and wanted to go over her arrival time on the day of surgery. Since our pre-op phone call on 12/18/21, her surgery time has been changed  to 11:15 am on 12/25/21. I told her that her arrival time is 9:15 am, npo after MN, clear liquids until 8:15 am.Patient verbalized understanding.

## 2021-12-24 ENCOUNTER — Encounter (HOSPITAL_BASED_OUTPATIENT_CLINIC_OR_DEPARTMENT_OTHER): Payer: Self-pay | Admitting: Obstetrics and Gynecology

## 2021-12-25 ENCOUNTER — Encounter (HOSPITAL_BASED_OUTPATIENT_CLINIC_OR_DEPARTMENT_OTHER): Payer: Self-pay | Admitting: Obstetrics and Gynecology

## 2021-12-25 ENCOUNTER — Ambulatory Visit (HOSPITAL_BASED_OUTPATIENT_CLINIC_OR_DEPARTMENT_OTHER): Payer: BC Managed Care – PPO | Admitting: Anesthesiology

## 2021-12-25 ENCOUNTER — Encounter (HOSPITAL_BASED_OUTPATIENT_CLINIC_OR_DEPARTMENT_OTHER)
Admission: RE | Disposition: A | Discharge status: Home / Self Care | Payer: Self-pay | Source: Home / Self Care | Attending: Obstetrics and Gynecology

## 2021-12-25 ENCOUNTER — Other Ambulatory Visit: Payer: Self-pay

## 2021-12-25 ENCOUNTER — Ambulatory Visit (HOSPITAL_BASED_OUTPATIENT_CLINIC_OR_DEPARTMENT_OTHER)
Admission: RE | Admit: 2021-12-25 | Discharge: 2021-12-25 | Disposition: A | Discharge status: Home / Self Care | Payer: BC Managed Care – PPO | Attending: Obstetrics and Gynecology | Admitting: Obstetrics and Gynecology

## 2021-12-25 DIAGNOSIS — I2510 Atherosclerotic heart disease of native coronary artery without angina pectoris: Secondary | ICD-10-CM | POA: Diagnosis not present

## 2021-12-25 DIAGNOSIS — I252 Old myocardial infarction: Secondary | ICD-10-CM | POA: Diagnosis not present

## 2021-12-25 DIAGNOSIS — Z01818 Encounter for other preprocedural examination: Secondary | ICD-10-CM

## 2021-12-25 DIAGNOSIS — N840 Polyp of corpus uteri: Secondary | ICD-10-CM | POA: Diagnosis not present

## 2021-12-25 DIAGNOSIS — D252 Subserosal leiomyoma of uterus: Secondary | ICD-10-CM | POA: Insufficient documentation

## 2021-12-25 DIAGNOSIS — I519 Heart disease, unspecified: Secondary | ICD-10-CM

## 2021-12-25 DIAGNOSIS — N8003 Adenomyosis of the uterus: Secondary | ICD-10-CM | POA: Insufficient documentation

## 2021-12-25 DIAGNOSIS — D250 Submucous leiomyoma of uterus: Secondary | ICD-10-CM | POA: Diagnosis not present

## 2021-12-25 DIAGNOSIS — N939 Abnormal uterine and vaginal bleeding, unspecified: Secondary | ICD-10-CM | POA: Diagnosis present

## 2021-12-25 DIAGNOSIS — D251 Intramural leiomyoma of uterus: Secondary | ICD-10-CM | POA: Insufficient documentation

## 2021-12-25 HISTORY — DX: Anemia, unspecified: D64.9

## 2021-12-25 HISTORY — DX: Other forms of angina pectoris: I20.89

## 2021-12-25 HISTORY — DX: Disease of blood and blood-forming organs, unspecified: D75.9

## 2021-12-25 HISTORY — DX: Presence of spectacles and contact lenses: Z97.3

## 2021-12-25 HISTORY — DX: Bronchitis, not specified as acute or chronic: J40

## 2021-12-25 HISTORY — PX: ROBOTIC ASSISTED LAPAROSCOPIC HYSTERECTOMY AND SALPINGECTOMY: SHX6379

## 2021-12-25 LAB — POCT PREGNANCY, URINE: Preg Test, Ur: NEGATIVE

## 2021-12-25 LAB — TYPE AND SCREEN
ABO/RH(D): AB POS
Antibody Screen: NEGATIVE

## 2021-12-25 LAB — ABO/RH: ABO/RH(D): AB POS

## 2021-12-25 SURGERY — XI ROBOTIC ASSISTED LAPAROSCOPIC HYSTERECTOMY AND SALPINGECTOMY
Anesthesia: General | Site: Pelvis | Laterality: Bilateral

## 2021-12-25 MED ORDER — LACTATED RINGERS IV SOLN: INTRAVENOUS | Status: DC

## 2021-12-25 MED ORDER — TRAMADOL HCL 50 MG PO TABS
50.0000 mg | ORAL_TABLET | Freq: Four times a day (QID) | ORAL | Status: DC | PRN
  Administered 2021-12-25: 50 mg via ORAL

## 2021-12-25 MED ORDER — PANTOPRAZOLE SODIUM 40 MG PO TBEC
DELAYED_RELEASE_TABLET | ORAL | Status: AC
  Filled 2021-12-25: qty 1

## 2021-12-25 MED ORDER — ROCURONIUM BROMIDE 10 MG/ML (PF) SYRINGE
PREFILLED_SYRINGE | INTRAVENOUS | Status: AC
  Filled 2021-12-25: qty 10

## 2021-12-25 MED ORDER — SCOPOLAMINE 1 MG/3DAYS TD PT72
1.0000 | MEDICATED_PATCH | TRANSDERMAL | Status: DC
  Administered 2021-12-25: 1.5 mg via TRANSDERMAL

## 2021-12-25 MED ORDER — SUGAMMADEX SODIUM 200 MG/2ML IV SOLN
INTRAVENOUS | Status: DC | PRN
  Administered 2021-12-25: 200 mg via INTRAVENOUS

## 2021-12-25 MED ORDER — ENSURE PRE-SURGERY PO LIQD: 296.0000 mL | Freq: Once | ORAL | Status: DC

## 2021-12-25 MED ORDER — FENTANYL CITRATE (PF) 100 MCG/2ML IJ SOLN
INTRAMUSCULAR | Status: AC
  Filled 2021-12-25: qty 2

## 2021-12-25 MED ORDER — LIDOCAINE HCL (CARDIAC) PF 100 MG/5ML IV SOSY
PREFILLED_SYRINGE | INTRAVENOUS | Status: DC | PRN
  Administered 2021-12-25: 60 mg via INTRAVENOUS

## 2021-12-25 MED ORDER — GABAPENTIN 300 MG PO CAPS
300.0000 mg | ORAL_CAPSULE | ORAL | Status: AC
  Administered 2021-12-25: 300 mg via ORAL

## 2021-12-25 MED ORDER — FENTANYL CITRATE (PF) 100 MCG/2ML IJ SOLN
INTRAMUSCULAR | Status: DC | PRN
  Administered 2021-12-25 (×2): 25 ug via INTRAVENOUS
  Administered 2021-12-25: 100 ug via INTRAVENOUS
  Administered 2021-12-25 (×3): 25 ug via INTRAVENOUS
  Administered 2021-12-25: 50 ug via INTRAVENOUS
  Administered 2021-12-25: 25 ug via INTRAVENOUS
  Administered 2021-12-25: 50 ug via INTRAVENOUS

## 2021-12-25 MED ORDER — POVIDONE-IODINE 10 % EX SWAB: 2.0000 | Freq: Once | CUTANEOUS | Status: DC

## 2021-12-25 MED ORDER — IBUPROFEN 600 MG PO TABS: ORAL_TABLET | ORAL | 1 refills | Status: AC

## 2021-12-25 MED ORDER — ROCURONIUM BROMIDE 100 MG/10ML IV SOLN
INTRAVENOUS | Status: DC | PRN
  Administered 2021-12-25: 20 mg via INTRAVENOUS
  Administered 2021-12-25: 60 mg via INTRAVENOUS
  Administered 2021-12-25 (×2): 20 mg via INTRAVENOUS

## 2021-12-25 MED ORDER — ENSURE PRE-SURGERY PO LIQD: 592.0000 mL | Freq: Once | ORAL | Status: DC

## 2021-12-25 MED ORDER — IBUPROFEN 200 MG PO TABS
600.0000 mg | ORAL_TABLET | Freq: Four times a day (QID) | ORAL | Status: DC
  Administered 2021-12-25: 600 mg via ORAL

## 2021-12-25 MED ORDER — EPHEDRINE SULFATE (PRESSORS) 50 MG/ML IJ SOLN
INTRAMUSCULAR | Status: DC | PRN
  Administered 2021-12-25 (×2): 10 mg via INTRAVENOUS
  Administered 2021-12-25: 5 mg via INTRAVENOUS

## 2021-12-25 MED ORDER — FENTANYL CITRATE (PF) 250 MCG/5ML IJ SOLN
INTRAMUSCULAR | Status: AC
  Filled 2021-12-25: qty 5

## 2021-12-25 MED ORDER — ACETAMINOPHEN 500 MG PO TABS
ORAL_TABLET | ORAL | Status: AC
  Filled 2021-12-25: qty 2

## 2021-12-25 MED ORDER — LIDOCAINE HCL (PF) 2 % IJ SOLN
INTRAMUSCULAR | Status: AC
  Filled 2021-12-25: qty 5

## 2021-12-25 MED ORDER — GABAPENTIN 300 MG PO CAPS
ORAL_CAPSULE | ORAL | Status: AC
  Filled 2021-12-25: qty 1

## 2021-12-25 MED ORDER — TRAMADOL HCL 50 MG PO TABS: ORAL_TABLET | ORAL | 0 refills | Status: AC

## 2021-12-25 MED ORDER — MIDAZOLAM HCL 2 MG/2ML IJ SOLN
INTRAMUSCULAR | Status: AC
  Filled 2021-12-25: qty 2

## 2021-12-25 MED ORDER — SODIUM CHLORIDE 0.9 % IV SOLN
INTRAVENOUS | Status: AC
  Filled 2021-12-25: qty 2

## 2021-12-25 MED ORDER — DOCUSATE SODIUM 100 MG PO CAPS: 100.0000 mg | ORAL_CAPSULE | Freq: Two times a day (BID) | ORAL | Status: DC

## 2021-12-25 MED ORDER — ACETAMINOPHEN 500 MG PO TABS: ORAL_TABLET | ORAL | 0 refills | Status: AC

## 2021-12-25 MED ORDER — CELECOXIB 200 MG PO CAPS
400.0000 mg | ORAL_CAPSULE | ORAL | Status: AC
  Administered 2021-12-25: 400 mg via ORAL

## 2021-12-25 MED ORDER — GLYCOPYRROLATE 0.2 MG/ML IJ SOLN
INTRAMUSCULAR | Status: DC | PRN
  Administered 2021-12-25: .2 mg via INTRAVENOUS

## 2021-12-25 MED ORDER — CELECOXIB 200 MG PO CAPS
ORAL_CAPSULE | ORAL | Status: AC
  Filled 2021-12-25: qty 2

## 2021-12-25 MED ORDER — SODIUM CHLORIDE 0.9 % IV SOLN
INTRAVENOUS | Status: DC | PRN
  Administered 2021-12-25: 116 mL

## 2021-12-25 MED ORDER — ACETAMINOPHEN 500 MG PO TABS
1000.0000 mg | ORAL_TABLET | ORAL | Status: AC
  Administered 2021-12-25: 1000 mg via ORAL

## 2021-12-25 MED ORDER — SODIUM CHLORIDE 0.9 % IR SOLN
Status: DC | PRN
  Administered 2021-12-25: 1000 mL
  Administered 2021-12-25: 1000 mL via INTRAVESICAL

## 2021-12-25 MED ORDER — PHENYLEPHRINE HCL (PRESSORS) 10 MG/ML IV SOLN
INTRAVENOUS | Status: DC | PRN
  Administered 2021-12-25 (×9): 80 ug via INTRAVENOUS

## 2021-12-25 MED ORDER — ONDANSETRON HCL 4 MG/2ML IJ SOLN
INTRAMUSCULAR | Status: AC
  Filled 2021-12-25: qty 2

## 2021-12-25 MED ORDER — SUCCINYLCHOLINE CHLORIDE 200 MG/10ML IV SOSY
PREFILLED_SYRINGE | INTRAVENOUS | Status: DC | PRN
  Administered 2021-12-25: 120 mg via INTRAVENOUS

## 2021-12-25 MED ORDER — STERILE WATER FOR IRRIGATION IR SOLN
Status: DC | PRN
  Administered 2021-12-25: 500 mL

## 2021-12-25 MED ORDER — ACETAMINOPHEN 500 MG PO TABS
1000.0000 mg | ORAL_TABLET | Freq: Four times a day (QID) | ORAL | Status: DC
  Administered 2021-12-25: 1000 mg via ORAL

## 2021-12-25 MED ORDER — DEXAMETHASONE SODIUM PHOSPHATE 4 MG/ML IJ SOLN
INTRAMUSCULAR | Status: DC | PRN
  Administered 2021-12-25: 5 mg via INTRAVENOUS

## 2021-12-25 MED ORDER — MENTHOL 3 MG MT LOZG: 1.0000 | LOZENGE | OROMUCOSAL | Status: DC | PRN

## 2021-12-25 MED ORDER — GLYCOPYRROLATE PF 0.2 MG/ML IJ SOSY
PREFILLED_SYRINGE | INTRAMUSCULAR | Status: AC
  Filled 2021-12-25: qty 1

## 2021-12-25 MED ORDER — IBUPROFEN 200 MG PO TABS
ORAL_TABLET | ORAL | Status: AC
  Filled 2021-12-25: qty 3

## 2021-12-25 MED ORDER — SUCCINYLCHOLINE CHLORIDE 200 MG/10ML IV SOSY
PREFILLED_SYRINGE | INTRAVENOUS | Status: AC
  Filled 2021-12-25: qty 10

## 2021-12-25 MED ORDER — PROPOFOL 10 MG/ML IV BOLUS
INTRAVENOUS | Status: DC | PRN
  Administered 2021-12-25: 200 mg via INTRAVENOUS

## 2021-12-25 MED ORDER — PROPOFOL 10 MG/ML IV BOLUS
INTRAVENOUS | Status: AC
  Filled 2021-12-25: qty 20

## 2021-12-25 MED ORDER — MIDAZOLAM HCL 5 MG/5ML IJ SOLN
INTRAMUSCULAR | Status: DC | PRN
  Administered 2021-12-25: 2 mg via INTRAVENOUS

## 2021-12-25 MED ORDER — PANTOPRAZOLE SODIUM 40 MG PO TBEC
40.0000 mg | DELAYED_RELEASE_TABLET | Freq: Every day | ORAL | Status: DC
  Administered 2021-12-25: 40 mg via ORAL

## 2021-12-25 MED ORDER — SODIUM CHLORIDE 0.9 % IV SOLN
2.0000 g | INTRAVENOUS | Status: AC
  Administered 2021-12-25: 2 g via INTRAVENOUS

## 2021-12-25 MED ORDER — TRAMADOL HCL 50 MG PO TABS
ORAL_TABLET | ORAL | Status: AC
  Filled 2021-12-25: qty 1

## 2021-12-25 MED ORDER — FENTANYL CITRATE (PF) 100 MCG/2ML IJ SOLN
25.0000 ug | INTRAMUSCULAR | Status: DC | PRN
  Administered 2021-12-25 (×3): 25 ug via INTRAVENOUS

## 2021-12-25 MED ORDER — ONDANSETRON HCL 4 MG/2ML IJ SOLN: 4.0000 mg | Freq: Four times a day (QID) | INTRAMUSCULAR | Status: DC | PRN

## 2021-12-25 MED ORDER — SCOPOLAMINE 1 MG/3DAYS TD PT72
MEDICATED_PATCH | TRANSDERMAL | Status: AC
  Filled 2021-12-25: qty 1

## 2021-12-25 MED ORDER — ONDANSETRON HCL 4 MG PO TABS: 4.0000 mg | ORAL_TABLET | Freq: Four times a day (QID) | ORAL | Status: DC | PRN

## 2021-12-25 MED ORDER — DEXAMETHASONE SODIUM PHOSPHATE 10 MG/ML IJ SOLN
INTRAMUSCULAR | Status: AC
  Filled 2021-12-25: qty 1

## 2021-12-25 MED ORDER — SIMETHICONE 80 MG PO CHEW: 80.0000 mg | CHEWABLE_TABLET | Freq: Four times a day (QID) | ORAL | Status: DC | PRN

## 2021-12-25 MED ORDER — PHENYLEPHRINE 80 MCG/ML (10ML) SYRINGE FOR IV PUSH (FOR BLOOD PRESSURE SUPPORT)
PREFILLED_SYRINGE | INTRAVENOUS | Status: AC
  Filled 2021-12-25: qty 10

## 2021-12-25 SURGICAL SUPPLY — 71 items
ADH SKN CLS APL DERMABOND .7 (GAUZE/BANDAGES/DRESSINGS) ×1
APL SKNCLS STERI-STRIP NONHPOA (GAUZE/BANDAGES/DRESSINGS) ×1
BARRIER ADHS 3X4 INTERCEED (GAUZE/BANDAGES/DRESSINGS) ×1 IMPLANT
BENZOIN TINCTURE PRP APPL 2/3 (GAUZE/BANDAGES/DRESSINGS) IMPLANT
BRR ADH 4X3 ABS CNTRL BYND (GAUZE/BANDAGES/DRESSINGS) ×2
CATH FOLEY 3WAY  5CC 16FR (CATHETERS) ×1
CATH FOLEY 3WAY 5CC 16FR (CATHETERS) ×1 IMPLANT
COVER BACK TABLE 60X90IN (DRAPES) ×1 IMPLANT
COVER TIP SHEARS 8 DVNC (MISCELLANEOUS) ×1 IMPLANT
COVER TIP SHEARS 8MM DA VINCI (MISCELLANEOUS) ×1
DEFOGGER SCOPE WARMER CLEARIFY (MISCELLANEOUS) ×1 IMPLANT
DERMABOND ADVANCED .7 DNX12 (GAUZE/BANDAGES/DRESSINGS) ×1 IMPLANT
DRAPE ARM DVNC X/XI (DISPOSABLE) ×4 IMPLANT
DRAPE COLUMN DVNC XI (DISPOSABLE) ×1 IMPLANT
DRAPE DA VINCI XI ARM (DISPOSABLE) ×4
DRAPE DA VINCI XI COLUMN (DISPOSABLE) ×1
DRAPE UTILITY XL STRL (DRAPES) ×1 IMPLANT
DRSG OPSITE POSTOP 3X4 (GAUZE/BANDAGES/DRESSINGS) IMPLANT
DURAPREP 26ML APPLICATOR (WOUND CARE) ×1 IMPLANT
ELECT REM PT RETURN 9FT ADLT (ELECTROSURGICAL) ×1
ELECTRODE REM PT RTRN 9FT ADLT (ELECTROSURGICAL) ×1 IMPLANT
GAUZE 4X4 16PLY ~~LOC~~+RFID DBL (SPONGE) ×2 IMPLANT
GLOVE BIO SURGEON STRL SZ7 (GLOVE) ×1 IMPLANT
GLOVE BIOGEL PI IND STRL 7.0 (GLOVE) ×6 IMPLANT
GLOVE ECLIPSE 6.5 STRL STRAW (GLOVE) ×3 IMPLANT
HOLDER FOLEY CATH W/STRAP (MISCELLANEOUS) IMPLANT
IRRIG SUCT STRYKERFLOW 2 WTIP (MISCELLANEOUS) ×1
IRRIGATION SUCT STRKRFLW 2 WTP (MISCELLANEOUS) ×1 IMPLANT
KIT TURNOVER CYSTO (KITS) ×1 IMPLANT
LEGGING LITHOTOMY PAIR STRL (DRAPES) ×1 IMPLANT
NEEDLE HYPO 22GX1.5 SAFETY (NEEDLE) ×1 IMPLANT
OBTURATOR OPTICAL STANDARD 8MM (TROCAR) ×1
OBTURATOR OPTICAL STND 8 DVNC (TROCAR) ×1
OBTURATOR OPTICALSTD 8 DVNC (TROCAR) ×1 IMPLANT
OCCLUDER COLPOPNEUMO (BALLOONS) ×1 IMPLANT
PACK ROBOT WH (CUSTOM PROCEDURE TRAY) ×1 IMPLANT
PACK ROBOTIC GOWN (GOWN DISPOSABLE) ×1 IMPLANT
PACK TRENDGUARD 450 HYBRID PRO (MISCELLANEOUS) ×1 IMPLANT
PAD OB MATERNITY 4.3X12.25 (PERSONAL CARE ITEMS) ×1 IMPLANT
PAD PREP 24X48 CUFFED NSTRL (MISCELLANEOUS) ×1 IMPLANT
POUCH LAPAROSCOPIC INSTRUMENT (MISCELLANEOUS) ×1 IMPLANT
PROTECTOR NERVE ULNAR (MISCELLANEOUS) ×3 IMPLANT
SEAL CANN UNIV 5-8 DVNC XI (MISCELLANEOUS) ×4 IMPLANT
SEAL XI 5MM-8MM UNIVERSAL (MISCELLANEOUS) ×4
SEALER VESSEL DA VINCI XI (MISCELLANEOUS) ×1
SEALER VESSEL EXT DVNC XI (MISCELLANEOUS) IMPLANT
SET IRRIG Y TYPE TUR BLADDER L (SET/KITS/TRAYS/PACK) IMPLANT
SET TRI-LUMEN FLTR TB AIRSEAL (TUBING) ×1 IMPLANT
SOL SCRUB PVP POV-IOD 4OZ 7.5% (MISCELLANEOUS) ×1
SOLUTION ELECTROLUBE (MISCELLANEOUS) IMPLANT
SOLUTION SCRB POV-IOD 4OZ 7.5% (MISCELLANEOUS) ×1 IMPLANT
SPIKE FLUID TRANSFER (MISCELLANEOUS) ×2 IMPLANT
SPONGE T-LAP 4X18 ~~LOC~~+RFID (SPONGE) ×1 IMPLANT
STRIP CLOSURE SKIN 1/2X4 (GAUZE/BANDAGES/DRESSINGS) IMPLANT
STRIP CLOSURE SKIN 1/4X4 (GAUZE/BANDAGES/DRESSINGS) ×1 IMPLANT
SUT MNCRL AB 3-0 PS2 27 (SUTURE) ×2 IMPLANT
SUT VIC AB 0 CT1 27 (SUTURE) ×2
SUT VIC AB 0 CT1 27XBRD ANBCTR (SUTURE) ×2 IMPLANT
SUT VIC AB 2-0 UR6 27 (SUTURE) ×2 IMPLANT
SUT VICRYL 0 UR6 27IN ABS (SUTURE) ×1 IMPLANT
SUT VLOC 180 0 9IN  GS21 (SUTURE) ×3
SUT VLOC 180 0 9IN GS21 (SUTURE) ×2 IMPLANT
TIP RUMI ORANGE 6.7MMX12CM (TIP) IMPLANT
TIP UTERINE 5.1X6CM LAV DISP (MISCELLANEOUS) IMPLANT
TIP UTERINE 6.7X10CM GRN DISP (MISCELLANEOUS) IMPLANT
TIP UTERINE 6.7X6CM WHT DISP (MISCELLANEOUS) IMPLANT
TIP UTERINE 6.7X8CM BLUE DISP (MISCELLANEOUS) IMPLANT
TOWEL OR 17X26 10 PK STRL BLUE (TOWEL DISPOSABLE) ×1 IMPLANT
TRENDGUARD 450 HYBRID PRO PACK (MISCELLANEOUS) ×1
TROCAR PORT AIRSEAL 8X120 (TROCAR) ×1 IMPLANT
WATER STERILE IRR 500ML POUR (IV SOLUTION) IMPLANT

## 2021-12-25 NOTE — Interval H&P Note (Signed)
History and Physical Interval Note:  12/25/2021 9:44 AM  [name]  has presented today for surgery, with the diagnosis of [place].  The various methods of treatment have been discussed with the patient and family. After consideration of risks, benefits and other options for treatment, the patient has consented to  Procedure(s): XI ROBOTIC ASSISTED LAPAROSCOPIC HYSTERECTOMY AND SALPINGECTOMY (Bilateral) as a surgical intervention.  The patient's history has been reviewed, patient examined, no change in status, stable for surgery.  I have reviewed the patient's chart and labs.  Questions were answered to the patient's satisfaction.     Katharine Look A [name]

## 2021-12-25 NOTE — Transfer of Care (Signed)
Immediate Anesthesia Transfer of Care Note  [name]  Procedure(s) Performed: Procedure(s) (LRB): XI ROBOTIC ASSISTED LAPAROSCOPIC HYSTERECTOMY AND SALPINGECTOMY (Bilateral)  Patient Location: PACU  Anesthesia Type: General  Level of Consciousness: awake, sedated, patient cooperative and responds to stimulation  Airway & Oxygen Therapy: Patient Spontanous Breathing and Patient connected to [place] oxygen  Post-op Assessment: Report given to PACU RN, Post -op Vital signs reviewed and stable and Patient moving all extremities  Post vital signs: Reviewed and stable  Complications: No apparent anesthesia complications

## 2021-12-25 NOTE — Anesthesia Procedure Notes (Signed)
Procedure Name: Intubation Date/Time: 12/25/2021 11:34 AM  Performed by: Justice Rocher, CRNAPre-anesthesia Checklist: Patient identified, Emergency Drugs available, Suction available, Patient being monitored and Timeout performed Patient Re-evaluated:Patient Re-evaluated prior to induction Oxygen Delivery Method: Circle system utilized Preoxygenation: Pre-oxygenation with 100% oxygen Induction Type: IV induction Ventilation: Mask ventilation without difficulty Laryngoscope Size: Mac and 3 Grade View: Grade II Tube type: Oral Tube size: 7.0 mm Number of attempts: 1 Airway Equipment and Method: Stylet and Oral airway Placement Confirmation: ETT inserted through vocal cords under direct vision, positive ETCO2, breath sounds checked- equal and bilateral and CO2 detector Secured at: 22 cm Tube secured with: Tape Dental Injury: Teeth and Oropharynx as per pre-operative assessment

## 2021-12-25 NOTE — Progress Notes (Signed)
Dr. Belinda Block informed that patient has significant heart history with current meds including isosorbide, ranexa, nitroglycerin patch and that Anesthesia has not reviewed yet. She will evaluate patient on arrival.

## 2021-12-25 NOTE — Discharge Instructions (Signed)
Call [facility] at [phone] for :  Post operative appointment time and date if it is not included in your post operative information.  Temperature greater than or equal to 100.4 degrees Farenheit orally Excessive pain not managed with your pain medications Excessive bleeding, problems urinating or other concerns   While taking pain medications, take Colace (Docusate Sodium) 100 mg 2-3 times daily until bowel movements are regular to prevent constipation.  Take Ibuprofen 600 mg with food and Tylenol (Acetaminophen) #2-500 mg tablets every 6 hours for 5 days then as needed for pain  You may shower tomorrow You may walk up stairs  You may drive after 2 weeks You may resume a regular diet  You should not lift over 15 pounds for 6 weeks You should not place anything in your vagina for 6 weeks (or until after your post operative visit)

## 2021-12-25 NOTE — Anesthesia Postprocedure Evaluation (Signed)
Anesthesia Post Note  [name]  Procedure(s) Performed: XI ROBOTIC ASSISTED LAPAROSCOPIC HYSTERECTOMY AND SALPINGECTOMY (Bilateral: Pelvis)     Patient location during evaluation: PACU Anesthesia Type: General Level of consciousness: awake and alert and oriented Pain management: pain level controlled Vital Signs Assessment: post-procedure vital signs reviewed and stable Respiratory status: spontaneous breathing, nonlabored ventilation and respiratory function stable Cardiovascular status: blood pressure returned to baseline and stable Postop Assessment: no apparent nausea or vomiting Anesthetic complications: no   No notable events documented.  Last Vitals:  Vitals:   12/25/21 1515 12/25/21 1530  BP: 107/72 108/65  Pulse: 66 75  Resp: 10 (!) 9  Temp: (!) 35.9 C   SpO2: 99% 100%    Last Pain:  Vitals:   12/25/21 1530  TempSrc:   PainSc: 6                  [name]

## 2021-12-25 NOTE — Op Note (Signed)
Preoperative diagnosis: abnormal uterine bleeding with uterine fibroids  Postoperative diagnosis: Same   Anesthesia: General  Anesthesiologist: Dr. Nyoka Cowden  Procedure: Robotically assisted total hysterectomy with bilateral salpingectomy  Surgeon: Dr. Katharine Look Kevonna Nolte  Assistant: Earnstine Regal P.A.-C.  Estimated blood loss: 50  Procedure:  After being informed of the planned procedure with possible complications including but not limited to bleeding, infection, injury to other organs, need for laparotomy, expected hospital stay and recovery, informed consent is obtained and patient is taken to or #5. She is placed in  lithotomy position on [place] with both arms padded and tucked on each side and bilateral knee-high sequential compressive devices. She is given general anesthesia with endotracheal intubation without any complication. She is prepped and draped in a sterile fashion. A three-way Foley catheter is inserted in her bladder.  Pelvic exam reveals: anteverted uterus and 2 normal adnexa  A weighted speculum is inserted in the vagina and the anterior lip of the cervix is grasped with a tenaculum forcep. We proceed with a paracervical block and vaginal infiltration using ropivacaine 0.5% diluted 1 in 1 with saline. The uterus was then sounded at 8 cm. We easily dilate the cervix using Hegar dilator to  #27 which allows for easy placement of the intrauterine RUMI manipulator with a 3.5 KOH ring and a vaginal occluder. The ring is sutured to the cervix with 0 Vicryl.  Trocar placement is decided. We infiltrate at the umbilicus with 10 cc of ropivacaine per protocol and perform a 10 mm semi-elliptical incision which is brought down bluntly to the fascia. The fascia is identified and grasped with Coker forceps. The fascia is incised with Mayo scissors. Peritoneum is entered bluntly. A pursestring suture of 0 Vicryl is placed on the fascia and a 10 mm Hassan trocar is easily inserted in the  abdominal cavity held in placed with a Purstring suture. This allows for easy insufflation of a pneumoperitoneum using warmed CO2 at a maximum pressure of 15 mm of mercury. 60 cc of Ropivacaine 0.5 % diluted 1 in 1 is sent in the pelvis and the patient is positioned in reverse Trendelenburg. We then placed two 59m robotic trocar on the left, one 878mrobotic trocar on the right and one 8 mm patient's side assistant trocar on the right  after infiltrating every site  with ropivacaine per protocol. The robot is docked on the right of the patient after positioning her in Trendelenburg. A Vessel Seal is inserted in arm #4, a Long bipolar forceps is inserted in arm #2 and a Prograsp is inserted in arm #1.  Preparation and docking is completed in 44 minutes.  Observation: uterus is anteverted with a 5 cm posterior fibroid. 2 normal ovaries. Liver and appendix are normal.  We start on the right side by sealing and cutting the mesosalpynx , the right utero-ovarian ligament and the right round ligament .  This gives usKoreantry into the retroperitoneal space with an easy dissection of the anterior broad ligament. The ligament was opened all the way to the left round ligament.   We then proceed with systematic dissection of the bladder from the anterior vaginal cuff which is easily identified with the KOH ring. The plane of dissection is confirmed with filling the bladder with 200 cc of saline. We are able to dissect the bladder 2 cm below the KOH ring. We then opened the posterior right broad ligament all the way to the posterior KOH ring after identifying the full course of the  right ureter.   Moving to the left side we Seal and cut  the left round ligament , the left utero-ovarian ligament and  the mesosalpinx in between. Entry into the retroperitoneal space allows [place] to complete dissection of the bladder on the left side and skeletonized the uterine vessels. The left broad ligament is then dissected all the way to  the posterior KOH ring after identifying the full course of the left ureter.  Both tubes are removed from the pelvic cavity through the assistant's trocar.   With pressure on the KOH ring and the bladder fully dissected, we cauterize and cut the uterine vessels on both sides at the level of the KOH ring.  The vaginal occluder is inflated and we proceed with a 360 colpotomy using an open monopolar scissors and freeing the uterus entirely.  The uterus is delivered vaginally with traction. The vaginal occluder is reinserted in the vagina to maintain pneumoperitoneum.  Instruments are then modified for a suture cut in arm #4 and a long tip forcep in arm #2. We proceed with closure of the vaginal cuff with 2 running sutures of 0 V-Lock. We irrigated profusely with warm saline and confirm a satisfactory hemostasis as well as 2 normal ureters with good mobility and no dilatation.  A sheet of Interceed , divided in 2, is placed on the vaginal cuff and behind the ovaries.  All instruments are then removed and the robot is undocked. Console time: 1 hours and 21 minutes.  All trochars are removed under direct visualization after evacuating the pneumoperitoneum.  The fascia of the supraumbilical incision is closed with the previously placed pursestring suture of 0 Vicryl. All incisions are then closed with subcuticular suture of 3-0 Monocryl and Dermabond.  A speculum is inserted in the vagina to confirm a adequate closure of the vaginal cuff and good hemostasis.  Instrument and sponge count is complete x2. The procedure is well tolerated by the patient is taken to recovery room in a well and stable condition.  Dr Cletis Media was present and scrubbed at all times. Surgical assistance was required due to the complexity of the anatomy and the robotic approach of the procedure.   Estimated blood loss: 50 cc  Specimen: Uterus and tubes weighing 173 g sent to pathology

## 2021-12-25 NOTE — Anesthesia Preprocedure Evaluation (Addendum)
Anesthesia Evaluation  Patient identified by MRN, date of birth, ID band Patient awake  General Assessment Comment:History noted Dr. Nyoka Cowden  Reviewed: Allergy & Precautions, NPO status , Patient's Chart, lab work & pertinent test results  Airway Mallampati: II       Dental   Pulmonary shortness of breath, asthma    breath sounds clear to auscultation       Cardiovascular + angina  + CAD and + Past MI   Rhythm:Regular Rate:Normal  History noted. Cardiac clearance note noted. No SOB or CP. Dr. Nyoka Cowden   Neuro/Psych  Headaches  Neuromuscular disease    GI/Hepatic Neg liver ROS,,,Hx noted Dr. Nyoka Cowden   Endo/Other    Renal/GU negative Renal ROS     Musculoskeletal   Abdominal   Peds  Hematology   Anesthesia Other Findings   Reproductive/Obstetrics                             Anesthesia Physical Anesthesia Plan  ASA: 3  Anesthesia Plan: General   Post-op Pain Management: Tylenol PO (pre-op)*   Induction: Intravenous  PONV Risk Score and Plan: 3 and Ondansetron, Dexamethasone and Midazolam  Airway Management Planned: Oral ETT  Additional Equipment:   Intra-op Plan:   Post-operative Plan: Extubation in OR  Informed Consent: I have reviewed the patients History and Physical, chart, labs and discussed the procedure including the risks, benefits and alternatives for the proposed anesthesia with the patient or authorized representative who has indicated his/her understanding and acceptance.     Dental advisory given  Plan Discussed with: CRNA and Anesthesiologist  Anesthesia Plan Comments:        Anesthesia Quick Evaluation

## 2021-12-26 ENCOUNTER — Other Ambulatory Visit: Payer: Self-pay

## 2021-12-26 ENCOUNTER — Encounter (HOSPITAL_BASED_OUTPATIENT_CLINIC_OR_DEPARTMENT_OTHER): Payer: Self-pay | Admitting: Obstetrics and Gynecology

## 2021-12-27 LAB — SURGICAL PATHOLOGY

## 2022-03-24 ENCOUNTER — Other Ambulatory Visit: Payer: Self-pay | Admitting: Obstetrics and Gynecology

## 2022-03-24 DIAGNOSIS — R928 Other abnormal and inconclusive findings on diagnostic imaging of breast: Secondary | ICD-10-CM

## 2022-03-25 ENCOUNTER — Ambulatory Visit (INDEPENDENT_AMBULATORY_CARE_PROVIDER_SITE_OTHER): Payer: BC Managed Care – PPO

## 2022-03-25 ENCOUNTER — Ambulatory Visit (HOSPITAL_COMMUNITY)
Admission: RE | Admit: 2022-03-25 | Discharge: 2022-03-25 | Disposition: A | Discharge status: Home / Self Care | Payer: BC Managed Care – PPO | Source: Ambulatory Visit | Attending: Internal Medicine | Admitting: Internal Medicine

## 2022-03-25 ENCOUNTER — Encounter (HOSPITAL_COMMUNITY): Payer: Self-pay

## 2022-03-25 VITALS — BP 102/68 | HR 91 | Temp 97.5°F | Resp 24 | Ht 59.0 in | Wt 167.0 lb

## 2022-03-25 DIAGNOSIS — R0602 Shortness of breath: Secondary | ICD-10-CM | POA: Diagnosis not present

## 2022-03-25 DIAGNOSIS — R531 Weakness: Secondary | ICD-10-CM | POA: Diagnosis not present

## 2022-03-25 NOTE — ED Triage Notes (Signed)
Chief Complaint: cough and SOB. Patient had a sinus infection and the flu. Feels like this went down into the chest. Has history of bronchitis and heart disease.   Onset: Friday night.   Prescriptions or OTC medications tried: Yes- alka-seltzer, inhaler     with little relief  Sick exposure: No  New foods, medications, or products: No  Recent Travel: No

## 2022-03-25 NOTE — Discharge Instructions (Addendum)
Your chest x-ray in the clinic is negative. I am very concerned with how short of breath you are that there may be something else going on in your chest. We lack the sources here at urgent care to get a better view and imaging of your chest to rule out other problems.  I would like for you to go to the nearest ER for further evaluation.

## 2022-03-25 NOTE — ED Provider Notes (Signed)
[place]    CSN: [id number] Arrival date & time: 03/25/22  1457      History   Chief Complaint Chief Complaint  Patient presents with   Cough    Entered by patient   Shortness of Breath   Appointment    HPI [name] is a 52 y.o. female.   Patient presents to urgent care for evaluation of cough, nasal congestion, generalized fatigue, shortness of breath, and generalized weakness that started greater than 7 days ago when she became ill with what she supposes to be the flu.  She was exposed to her grandchildren who tested positive for the flu and was sick all last week for the last 8 to 9 days.  Patient was managing her symptoms at home with over-the-counter medications and states she felt some relief with over-the-counter medications, however she developed shortness of breath and worsening generalized weakness/fatigue over the last 2 to 3 days.  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  Last echocardiogram in 2018 EF was 55 to 60% (chart review).  She reports new orthopnea over the last 2 to 3 days but denies leg swelling and heart palpitations. Uses nitroglycerin patch daily for chronic stable angina. Denies taking sublingual nitroglycerin recently, but carries with her at all times.  She states it took her all morning this morning (several hours) to be able to get dressed and muster up the energy to come to urgent care for evaluation due to shortness of breath and generalized weakness.  She has not had any fever, chills, nausea, vomiting, abdominal pain, urinary symptoms, headache, vision changes, one-sided extremity weakness, paresthesias, wheezing, or changes in gait. No recent long car rides, although she has had long periods of sitting due to feeling ill recently. No calf pain, redness, or unilateral swelling. She has been taking all of her home medications as prescribed.     Cough Associated symptoms: shortness of breath   Shortness of Breath Associated symptoms: cough     Past Medical History:  Diagnosis Date   Anemia    hx of microcytic anemia per pt   Blood dyscrasia    Bronchitis    mostly gets in winter   Cholelithiasis 08/2020   Chronic stable angina    Pt has microvascular disease and coronary vasospasms. Follows with cardiologist, Dr. Laroy Apple, [place] via telemedicine 07/03/2021 in [place] (as of 12/18/2021.)   Coronary artery dissection    a. s/p LHC 08/17/13 SCAD which likely originated in the mid LAD and propagated retrograde back to the ostium of the left main. Treated medically.    H/O varicella    Herniated disc, cervical    Hypotension    a. prev taken off BB due to BP [id number] systolic.   Ischemic cardiomyopathy    a. EF 35%, LV apical hypokinesia by cath 08/17/13 - f/u echo 08/2013 EF 50-55%.   Lung nodule    a. incidental finding on CT 08/2013. Needs follow up in 08/2014   Menstrual migraine    Microcytosis    a. Low ferritin 11/2013.   Monilial vaginitis 2005   Myocardial infarction ([place])    7-5 2015   Neuromuscular disorder ([place])    herniated disc c5,c6   Obesity, Class II, BMI 35-39.9    Ovarian cyst, left 2012   PMS (premenstrual syndrome)    PVC's (premature ventricular contractions)    Wears contact lenses    Wears glasses  Patient Active Problem List   Diagnosis Date Noted   Abnormal uterine bleeding 12/25/2021   Shortness of breath 11/18/2015   Obesity, Class II, BMI 35-39.9    DOE (dyspnea on exertion) 05/17/2015   Chest pain 05/17/2015   Old MI (myocardial infarction) 01/23/2014   Microcytosis    PVC's (premature ventricular contractions)    Asthmatic bronchitis    LV dysfunction 08/22/2013   Lung nodule    Coronary artery dissection 08/17/2013   Acute myocardial infarction of other anterior wall, initial episode of care 08/17/2013   Cervicalgia 05/16/2012   Herniated disc, cervical    Migraine 12/31/2011    Left ovarian cyst 02/20/2011    Past Surgical History:  Procedure Laterality Date   ANKLE FRACTURE SURGERY Left 2023   2 plates and 2 pins   CHOLECYSTECTOMY N/A 08/22/2020   Procedure: LAPAROSCOPIC CHOLECYSTECTOMY;  Surgeon: Clovis Riley, MD;  Location: [facility];  Service: General;  Laterality: N/A;   COMBINED HYSTEROSCOPY DIAGNOSTIC / D&C  01/15/2015   [facility], Dr. Cletis Media   HYSTEROSCOPY W/ ENDOMETRIAL ABLATION  01/15/2015   [facility], Dr. Cletis Media   LAPAROSCOPIC TUBAL LIGATION  02/20/2011   Procedure: LAPAROSCOPIC TUBAL LIGATION;  Surgeon: Alwyn Pea, MD;  Location: [facility];  Service: Gynecology;  Laterality: Bilateral;  with Removal of Mirena and Robotic Assisted Bilateral Tubal Ligation   LEFT HEART CATHETERIZATION WITH CORONARY ANGIOGRAM Bilateral 08/17/2013   Procedure: LEFT HEART CATHETERIZATION WITH CORONARY ANGIOGRAM;  Surgeon: Jettie Booze, MD;  Location: [facility] C[facility];  Service: Cardiovascular;  Laterality: Bilateral;   ROBOTIC ASSISTED LAPAROSCOPIC HYSTERECTOMY AND SALPINGECTOMY Bilateral 12/25/2021   Procedure: XI ROBOTIC ASSISTED LAPAROSCOPIC HYSTERECTOMY AND SALPINGECTOMY;  Surgeon: Delsa Bern, MD;  Location: [facility];  Service: Gynecology;  Laterality: Bilateral;   ROBOTIC ASSISTED LAPAROSCOPIC OVARIAN CYSTECTOMY  02/17/2011   TUBAL LIGATION  02/20/2011   WISDOM TOOTH EXTRACTION      OB History     Gravida  3   Para  2   Term      Preterm      AB      Living         SAB      IAB      Ectopic      Multiple      Live Births               Home Medications    Prior to Admission medications   Medication Sig Start Date End Date Taking? Authorizing Provider  amLODipine (NORVASC) 5 MG tablet Take 5 mg by mouth at bedtime.   Yes [provider]  Cholecalciferol (VITAMIN D-3) 25 MCG (1000 UT) CAPS Take by mouth daily.   Yes [provider]  furosemide (LASIX) 20 MG tablet TAKE 1 TABLET BY MOUTH DAILY  FOR 3 DAYS AND THEN AS NEEDED FOR SWELLING/SOB Patient taking differently: Take 20 mg by mouth daily as needed (swelling or shortness of breath). 10/24/19  Yes Skeet Latch, MD  ibuprofen (ADVIL) 600 MG tablet take 1 tablet po pc every 6 hours for 5 days then prn-post operative pain 12/25/21  Yes Earnstine Regal, PA-C  isosorbide mononitrate (IMDUR) 60 MG 24 hr tablet TAKE 1 AND 1/2 TABLET BY MOUTH EVERY MORNING AND 1 TABLET EVERY EVENING Patient taking differently: Take 120 mg by mouth daily. 12/15/19  Yes Skeet Latch, MD  MAGNESIUM CHLORIDE-CALCIUM PO Take 2 tablets by mouth at bedtime.   Yes [provider]  nitroGLYCERIN (NITRODUR -  DOSED IN MG/24 HR) 0.4 mg/hr patch Place 0.4 mg onto the skin daily.   Yes [provider]  nitroGLYCERIN (NITROSTAT) 0.4 MG SL tablet PLACE 1 TABLET (0.4 MG TOTAL) UNDER THE TONGUE EVERY 5 (FIVE) MINUTES AS NEEDED FOR CHEST PAIN. 06/05/20  Yes Skeet Latch, MD  Omega-3 Fatty Acids (FISH OIL PO) Take by mouth.   Yes [provider]  ranolazine (RANEXA) 1000 MG SR tablet Take 1 tablet (1,000 mg total) by mouth 2 (two) times daily. 11/24/19  Yes Skeet Latch, MD  acetaminophen (TYLENOL) 500 MG tablet take 2 tablets every 6 hours for 5 days then prn-post operative pain 12/25/21   Earnstine Regal, PA-C  traMADol (ULTRAM) 50 MG tablet take 1 tablet po every 6 hours prn for breakthrough post operative pain 12/25/21   Earnstine Regal, PA-C    Family History Family History  Problem Relation Age of Onset   Uterine cancer Mother    Breast cancer Paternal Aunt 65   Hypertension Father    Diabetes Father    Heart disease Father    Hypertension Brother    Hypertension Brother    Heart disease Paternal Grandmother     Social History Social History   Tobacco Use   Smoking status: Never   Smokeless tobacco: Never  Vaping Use   Vaping Use: Never used  Substance Use Topics   Alcohol use: No   Drug use: No     Allergies    Hydrocodone and Statins   Review of Systems Review of Systems  Respiratory:  Positive for cough and shortness of breath.   Per HPI   Physical Exam Triage Vital Signs ED Triage Vitals  Enc Vitals Group     BP 03/25/22 1524 102/68     Pulse Rate 03/25/22 1524 91     Resp 03/25/22 1524 20     Temp 03/25/22 1524 (!) 97.5 F (36.4 C)     Temp Source 03/25/22 1524 Oral     SpO2 03/25/22 1524 98 %     Weight 03/25/22 1524 167 lb (75.8 kg)     Height 03/25/22 1524 4' 11"$  (1.499 m)     Head Circumference --      Peak Flow --      Pain Score 03/25/22 1522 3     Pain Loc --      Pain Edu? --      Excl. in [place]? --    No data found.  Updated Vital Signs BP 102/68 (BP Location: Right Arm)   Pulse 91   Temp (!) 97.5 F (36.4 C) (Oral)   Resp (!) 24   Ht 4' 11"$  (1.499 m)   Wt 167 lb (75.8 kg)   LMP 12/02/2021 (Exact Date)   SpO2 98%   BMI 33.73 kg/m   Visual Acuity Right Eye Distance:   Left Eye Distance:   Bilateral Distance:    Right Eye Near:   Left Eye Near:    Bilateral Near:     Physical Exam Vitals and nursing note reviewed.  Constitutional:      Appearance: She is ill-appearing. She is not toxic-appearing or diaphoretic.     Comments: Patient appears uncomfortable and short of breath sitting in position of comfort in exam room. Speaks in full sentences with some difficulty.  HENT:     Head: Normocephalic and atraumatic.     Right Ear: Hearing, tympanic membrane, ear canal and external ear normal.     Left Ear: Hearing,  tympanic membrane, ear canal and external ear normal.     Nose: Congestion present.     Mouth/Throat:     Lips: Pink.     Mouth: Mucous membranes are moist. No angioedema.     Dentition: Normal dentition.     Tongue: No lesions. Tongue does not deviate from midline.     Palate: No mass and lesions.     Pharynx: Oropharynx is clear. Posterior oropharyngeal erythema present. No pharyngeal swelling, oropharyngeal exudate or uvula swelling.      Tonsils: No tonsillar exudate or tonsillar abscesses. 0 on the right. 0 on the left.     Comments: Mild erythema to the posterior oropharynx with evidence of post-nasal drainage to the posterior oropharynx. Normal phonation. Eyes:     General: Lids are normal. Vision grossly intact. Gaze aligned appropriately.     Extraocular Movements: Extraocular movements intact.     Conjunctiva/sclera: Conjunctivae normal.     Pupils: Pupils are equal, round, and reactive to light.     Comments: EOMs intact without pain or dizziness elicited.  Neck:     Vascular: No JVD.     Trachea: Trachea and phonation normal. No tracheal deviation.  Cardiovascular:     Rate and Rhythm: Normal rate and regular rhythm.     Pulses:          Radial pulses are 2+ on the right side and 2+ on the left side.     Heart sounds: Normal heart sounds, S1 normal and S2 normal.  Pulmonary:     Effort: Pulmonary effort is normal. Tachypnea present. No accessory muscle usage or respiratory distress.     Breath sounds: Normal breath sounds and air entry. No stridor. No decreased breath sounds, wheezing, rhonchi or rales.     Comments: Breath sounds clear to auscultation throughout.  Chest:     Chest wall: No tenderness.     Comments: No chest discomfort elicited with palpation of the chest wall. Abdominal:     General: Abdomen is flat.     Palpations: Abdomen is soft.     Tenderness: There is no abdominal tenderness.  Musculoskeletal:     Cervical back: Normal range of motion and neck supple.     Right lower leg: No tenderness. No edema.     Left lower leg: No tenderness. No edema.  Lymphadenopathy:     Cervical: No cervical adenopathy.  Skin:    General: Skin is warm.     Capillary Refill: Capillary refill takes less than 2 seconds.     Findings: No rash.  Neurological:     General: No focal deficit present.     Mental Status: She is alert and oriented to person, place, and time. Mental status is at baseline.      Cranial Nerves: Cranial nerves 2-12 are intact. No dysarthria or facial asymmetry.     Sensory: Sensation is intact.     Motor: Motor function is intact.     Coordination: Coordination is intact.     Gait: Gait is intact.     Comments: Non-focal neuro exam. Moves all 4 extremities with normal coordination voluntarily.   Psychiatric:        Mood and Affect: Mood normal.        Speech: Speech normal.        Behavior: Behavior normal.        Thought Content: Thought content normal.        Judgment: Judgment normal.  UC Treatments / Results  Labs (all labs ordered are listed, but only abnormal results are displayed) Labs Reviewed - No data to display  EKG   Radiology DG Chest 2 View  Result Date: 03/25/2022 CLINICAL DATA:  Short of breath EXAM: CHEST - 2 VIEW COMPARISON:  None Available. FINDINGS: The heart size and mediastinal contours are within normal limits. Both lungs are clear. The visualized skeletal structures are unremarkable. IMPRESSION: No active cardiopulmonary disease. Electronically Signed   By: Randa Ngo M.D.   On: 03/25/2022 17:27    Procedures Procedures (including critical care time)  Medications Ordered in UC Medications - No data to display  Initial Impression / Assessment and Plan / UC Course  I have reviewed the triage vital signs and the nursing notes.  Pertinent labs & imaging results that were available during my care of the patient were reviewed by me and considered in my medical decision making (see chart for details).   1. Shortness of breath, generalized weakness Unclear etiology of patient's tachypnea and shortness of breath. Patient is ill-appearing and appears very fatigued. Respirations are elevated in clinic between 24-26. Exam reveals stable cardiopulmonary findings without focal deficit. Neurologic exam intact. EKG shows NSR at 80 BPM without red flag ST/T wave changes. EKG does not show change from previous EKG in November 2023. Chest  x-ray obtained to rule out cardiopulmonary abnormality to explain patient's tachypnea and shortness of breath is without acute finding and is negative for active cardiopulmonary disease.   Presentation is inconsistent with classic PE presentation, and she does not meet Well's Criteria, however she may be displaying atypical presentation for PE. Sepsis considered as differential as well.  Given patient's history of chronic stable angina and history of severe microvascular disease to the arteries of the heart, I recommend patient go directly to the nearest emergency room for further workup and evaluation. Oxygen is stable at 98% on room air without need for supplemental O2. BP slightly soft at 102/68. She is stable for transport to the nearest ED via [facility].   Upon entering room to discuss these results and recommendations with patient, her husband was now present at the bedside.  Discussed recommendations at length and risks associated with deferring ED visit tonight. Husband and patient disagree and wish to go home instead of going to the ED.  Patient states she took one sublingual nitroglycerin tablet while I was out of the room shortly prior to discharge and this helped with her subjective shortness of breath. She appears to be more comfortable, but remains ill-appearing.  Discussed my medical recommendation for ER workup further, especially now that patient has taken a nitroglycerin and reports improvement in subjective shortness of breath. Answered all questions regarding options and potential causes of patient's symptoms. We simply lack the resources here at urgent care to be able to rule out some emergent causes of patient's shortness of breath. Patient and husband continue to decline ER visit recommendation, therefore leaving against medical advise. Patient discharged from urgent care in stable condition with husband.   Final Clinical Impressions(s) / UC Diagnoses   Final diagnoses:  Shortness of  breath     Discharge Instructions      Your chest x-ray in the clinic is negative. I am very concerned with how short of breath you are that there may be something else going on in your chest. We lack the sources here at urgent care to get a better view and imaging of your chest to rule  out other problems.  I would like for you to go to the nearest ER for further evaluation.      ED Prescriptions   None    PDMP not reviewed this encounter.   Talbot Grumbling, [place] 03/27/22 [place]

## 2022-03-25 NOTE — ED Notes (Signed)
Patient is being discharged from the Urgent Care and sent to the Emergency Department via [facility] . Per Michelle,NP, patient is in need of higher level of care due to need of higher care. Patient is aware and verbalizes understanding of plan of care.  Vitals:   03/25/22 1524 03/25/22 1627  BP: 102/68   Pulse: 91   Resp: 20 (!) 24  Temp: (!) 97.5 F (36.4 C)   SpO2: 98%

## 2022-04-08 ENCOUNTER — Encounter: Payer: Self-pay | Admitting: Emergency Medicine

## 2022-04-08 ENCOUNTER — Ambulatory Visit
Admission: EM | Admit: 2022-04-08 | Discharge: 2022-04-08 | Disposition: A | Discharge status: Home / Self Care | Payer: BC Managed Care – PPO | Attending: Nurse Practitioner | Admitting: Nurse Practitioner

## 2022-04-08 ENCOUNTER — Other Ambulatory Visit: Payer: Self-pay

## 2022-04-08 DIAGNOSIS — B349 Viral infection, unspecified: Secondary | ICD-10-CM | POA: Diagnosis not present

## 2022-04-08 DIAGNOSIS — Z1152 Encounter for screening for COVID-19: Secondary | ICD-10-CM | POA: Diagnosis not present

## 2022-04-08 LAB — POCT INFLUENZA A/B
Influenza A, POC: NEGATIVE
Influenza B, POC: NEGATIVE

## 2022-04-08 LAB — POCT RAPID STREP A (OFFICE): Rapid Strep A Screen: NEGATIVE

## 2022-04-08 MED ORDER — PROMETHAZINE-DM 6.25-15 MG/5ML PO SYRP: 5.0000 mL | ORAL_SOLUTION | Freq: Four times a day (QID) | ORAL | 0 refills | Status: AC | PRN

## 2022-04-08 MED ORDER — PREDNISONE 20 MG PO TABS: 40.0000 mg | ORAL_TABLET | Freq: Every day | ORAL | 0 refills | Status: AC

## 2022-04-08 NOTE — ED Triage Notes (Signed)
Pt reports woke up yesterday having chills, fever, body aches,sore throat.

## 2022-04-08 NOTE — ED Provider Notes (Signed)
[facility]    CSN: [id number] Arrival date & time: 04/08/22  1837      History   Chief Complaint Chief Complaint  Patient presents with   Chills    Cough,Fever, body aches, fatigue - Entered by patient    HPI [name] is a 52 y.o. female.   The history is provided by the patient.   The patient presents for complaints of fever, chills, body aches, headache, sore throat, and cough.  Symptoms started over the past 24 hours.  Patient denies ear pain, nasal congestion, runny nose, shortness of breath, difficulty breathing, abdominal pain, nausea, vomiting, or diarrhea.  Patient states she was sick with flu a little over a month ago.  She reports she has been taking over-the-counter medications for her symptoms.  She reports that her grandchild has been sick recently. Past Medical History:  Diagnosis Date   Anemia    hx of microcytic anemia per pt   Blood dyscrasia    Bronchitis    mostly gets in winter   Cholelithiasis 08/2020   Chronic stable angina    Pt has microvascular disease and coronary vasospasms. Follows with cardiologist, Dr. Laroy Apple, [place] via telemedicine 07/03/2021 in [place] (as of 12/18/2021.)   Coronary artery dissection    a. s/p LHC 08/17/13 SCAD which likely originated in the mid LAD and propagated retrograde back to the ostium of the left main. Treated medically.    H/O varicella    Herniated disc, cervical    Hypotension    a. prev taken off BB due to BP [id number] systolic.   Ischemic cardiomyopathy    a. EF 35%, LV apical hypokinesia by cath 08/17/13 - f/u echo 08/2013 EF 50-55%.   Lung nodule    a. incidental finding on CT 08/2013. Needs follow up in 08/2014   Menstrual migraine    Microcytosis    a. Low ferritin 11/2013.   Monilial vaginitis 2005   Myocardial infarction ([place])    7-5 2015   Neuromuscular disorder ([employer])    herniated disc c5,c6   Obesity, Class II, BMI 35-39.9    Ovarian cyst, left 2012   PMS (premenstrual syndrome)     PVC's (premature ventricular contractions)    Wears contact lenses    Wears glasses     Patient Active Problem List   Diagnosis Date Noted   Abnormal uterine bleeding 12/25/2021   Shortness of breath 11/18/2015   Obesity, Class II, BMI 35-39.9    DOE (dyspnea on exertion) 05/17/2015   Chest pain 05/17/2015   Old MI (myocardial infarction) 01/23/2014   Microcytosis    PVC's (premature ventricular contractions)    Asthmatic bronchitis    LV dysfunction 08/22/2013   Lung nodule    Coronary artery dissection 08/17/2013   Acute myocardial infarction of other anterior wall, initial episode of care 08/17/2013   Cervicalgia 05/16/2012   Herniated disc, cervical    Migraine 12/31/2011   Left ovarian cyst 02/20/2011    Past Surgical History:  Procedure Laterality Date   ANKLE FRACTURE SURGERY Left 2023   2 plates and 2 pins   CHOLECYSTECTOMY N/A 08/22/2020   Procedure: LAPAROSCOPIC CHOLECYSTECTOMY;  Surgeon: Clovis Riley, MD;  Location: [facility];  Service: General;  Laterality: N/A;   COMBINED HYSTEROSCOPY DIAGNOSTIC / D&C  01/15/2015   [facility], Dr. Cletis Media   HYSTEROSCOPY W/ ENDOMETRIAL ABLATION  01/15/2015   [facility], Dr. Cletis Media   LAPAROSCOPIC TUBAL LIGATION  02/20/2011   Procedure: LAPAROSCOPIC TUBAL  LIGATION;  Surgeon: Alwyn Pea, MD;  Location: [facility];  Service: Gynecology;  Laterality: Bilateral;  with Removal of Mirena and Robotic Assisted Bilateral Tubal Ligation   LEFT HEART CATHETERIZATION WITH CORONARY ANGIOGRAM Bilateral 08/17/2013   Procedure: LEFT HEART CATHETERIZATION WITH CORONARY ANGIOGRAM;  Surgeon: Jettie Booze, MD;  Location: [facility] CATH LAB;  Service: Cardiovascular;  Laterality: Bilateral;   ROBOTIC ASSISTED LAPAROSCOPIC HYSTERECTOMY AND SALPINGECTOMY Bilateral 12/25/2021   Procedure: XI ROBOTIC ASSISTED LAPAROSCOPIC HYSTERECTOMY AND SALPINGECTOMY;  Surgeon: Delsa Bern, MD;  Location: [facility];  Service: Gynecology;  Laterality:  Bilateral;   ROBOTIC ASSISTED LAPAROSCOPIC OVARIAN CYSTECTOMY  02/17/2011   TUBAL LIGATION  02/20/2011   WISDOM TOOTH EXTRACTION      OB History     Gravida  3   Para  2   Term      Preterm      AB      Living         SAB      IAB      Ectopic      Multiple      Live Births               Home Medications    Prior to Admission medications   Medication Sig Start Date End Date Taking? Authorizing Provider  predniSONE (DELTASONE) 20 MG tablet Take 2 tablets (40 mg total) by mouth daily with breakfast for 5 days. 04/08/22 04/13/22 Yes Maleiya Pergola-Warren, Alda Lea, NP  promethazine-dextromethorphan (PROMETHAZINE-DM) 6.25-15 MG/5ML syrup Take 5 mLs by mouth 4 (four) times daily as needed for cough. 04/08/22  Yes Elizabeth Haff-Warren, Alda Lea, NP  acetaminophen (TYLENOL) 500 MG tablet take 2 tablets every 6 hours for 5 days then prn-post operative pain 12/25/21   Earnstine Regal, PA-C  amLODipine (NORVASC) 5 MG tablet Take 5 mg by mouth at bedtime.    [provider]  Cholecalciferol (VITAMIN D-3) 25 MCG (1000 UT) CAPS Take by mouth daily.    [provider]  furosemide (LASIX) 20 MG tablet TAKE 1 TABLET BY MOUTH DAILY FOR 3 DAYS AND THEN AS NEEDED FOR SWELLING/SOB Patient taking differently: Take 20 mg by mouth daily as needed (swelling or shortness of breath). 10/24/19   Skeet Latch, MD  ibuprofen (ADVIL) 600 MG tablet take 1 tablet po pc every 6 hours for 5 days then prn-post operative pain 12/25/21   Earnstine Regal, PA-C  isosorbide mononitrate (IMDUR) 60 MG 24 hr tablet TAKE 1 AND 1/2 TABLET BY MOUTH EVERY MORNING AND 1 TABLET EVERY EVENING Patient taking differently: Take 120 mg by mouth daily. 12/15/19   Skeet Latch, MD  MAGNESIUM CHLORIDE-CALCIUM PO Take 2 tablets by mouth at bedtime.    [provider]  nitroGLYCERIN (NITRODUR - DOSED IN MG/24 HR) 0.4 mg/hr patch Place 0.4 mg onto the skin daily.    [provider]  nitroGLYCERIN  (NITROSTAT) 0.4 MG SL tablet PLACE 1 TABLET (0.4 MG TOTAL) UNDER THE TONGUE EVERY 5 (FIVE) MINUTES AS NEEDED FOR CHEST PAIN. 06/05/20   Skeet Latch, MD  Omega-3 Fatty Acids (FISH OIL PO) Take by mouth.    [provider]  ranolazine (RANEXA) 1000 MG SR tablet Take 1 tablet (1,000 mg total) by mouth 2 (two) times daily. 11/24/19   Skeet Latch, MD  traMADol Veatrice Bourbon) 50 MG tablet take 1 tablet po every 6 hours prn for breakthrough post operative pain 12/25/21   Earnstine Regal, PA-C    Family History Family History  Problem Relation Age of Onset   Uterine cancer Mother    Breast cancer Paternal Aunt 44   Hypertension Father    Diabetes Father    Heart disease Father    Hypertension Brother    Hypertension Brother    Heart disease Paternal Grandmother     Social History Social History   Tobacco Use   Smoking status: Never   Smokeless tobacco: Never  Vaping Use   Vaping Use: Never used  Substance Use Topics   Alcohol use: No   Drug use: No     Allergies   Hydrocodone and Statins   Review of Systems Review of Systems Per HPI  Physical Exam Triage Vital Signs ED Triage Vitals  Enc Vitals Group     BP 04/08/22 1931 114/76     Pulse Rate 04/08/22 1931 (!) 105     Resp 04/08/22 1931 (!) 22     Temp 04/08/22 1931 (!) 100.8 F (38.2 C)     Temp Source 04/08/22 1931 Oral     SpO2 04/08/22 1931 97 %     Weight --      Height --      Head Circumference --      Peak Flow --      Pain Score 04/08/22 1933 7     Pain Loc --      Pain Edu? --      Excl. in [place]? --    No data found.  Updated Vital Signs BP 114/76 (BP Location: Right Arm)   Pulse (!) 105   Temp (!) 100.8 F (38.2 C) (Oral)   Resp (!) 22   LMP 12/02/2021 (Exact Date)   SpO2 97%   Visual Acuity Right Eye Distance:   Left Eye Distance:   Bilateral Distance:    Right Eye Near:   Left Eye Near:    Bilateral Near:     Physical Exam Vitals and nursing note reviewed.   Constitutional:      General: She is not in acute distress.    Appearance: Normal appearance. She is well-developed.  HENT:     Head: Normocephalic.     Right Ear: Tympanic membrane, ear canal and external ear normal.     Left Ear: Tympanic membrane, ear canal and external ear normal.     Nose: Congestion present. No rhinorrhea.     Mouth/Throat:     Mouth: Mucous membranes are moist.     Pharynx: Posterior oropharyngeal erythema present.  Eyes:     Extraocular Movements: Extraocular movements intact.     Conjunctiva/sclera: Conjunctivae normal.     Pupils: Pupils are equal, round, and reactive to light.  Cardiovascular:     Rate and Rhythm: Regular rhythm. Tachycardia present.     Heart sounds: Normal heart sounds.  Pulmonary:     Effort: Pulmonary effort is normal.     Breath sounds: Normal breath sounds.  Abdominal:     General: Bowel sounds are normal. There is no distension.     Palpations: Abdomen is soft.     Tenderness: There is no abdominal tenderness. There is no guarding or rebound.  Genitourinary:    Vagina: Normal. No vaginal discharge.  Musculoskeletal:     Cervical back: Normal range of motion.  Lymphadenopathy:     Cervical: No cervical adenopathy.  Skin:    General: Skin is warm and dry.     Findings: No erythema or rash.  Neurological:     General: No  focal deficit present.     Mental Status: She is alert and oriented to person, place, and time.     Cranial Nerves: No cranial nerve deficit.  Psychiatric:        Mood and Affect: Mood normal.        Behavior: Behavior normal.      UC Treatments / Results  Labs (all labs ordered are listed, but only abnormal results are displayed) Labs Reviewed  SARS CORONAVIRUS 2 (TAT 6-24 HRS)  POCT RAPID STREP A (OFFICE)  POCT INFLUENZA A/B    EKG   Radiology No results found.  Procedures Procedures (including critical care time)  Medications Ordered in UC Medications - No data to display  Initial  Impression / Assessment and Plan / UC Course  I have reviewed the triage vital signs and the nursing notes.  Pertinent labs & imaging results that were available during my care of the patient were reviewed by me and considered in my medical decision making (see chart for details).     *** Final Clinical Impressions(s) / UC Diagnoses   Final diagnoses:  Encounter for screening for COVID-19  Viral illness     Discharge Instructions      The rapid strep test and flu test were negative.  A COVID test is pending.  You will be contacted if the COVID test is positive.  You are a candidate to receive next livid if your COVID test is positive.  Take medication as prescribed.  Increase fluids and allow for plenty of rest.  Continue over-the-counter Tylenol or ibuprofen as needed for pain, fever, or general discomfort.  For control of your fever, recommend medication every 8 hours.  For the cough, recommend use of a humidifier in your bedroom at nighttime during sleep and sleeping elevated on pillows while cough symptoms persist.  Warm salt water gargles as needed for throat pain or discomfort. It appears you most likely have a viral infection.  Please be advised that overall infection can last anywhere from 10 to 14 days.  If you experience sudden worsening before that time, or symptoms extend beyond that time, please follow-up with your primary care physician for further evaluation.  If you develop shortness of breath, difficulty breathing, or other concerns, please follow-up in the emergency department immediately for further evaluation.  Follow-up as needed.     ED Prescriptions     Medication Sig Dispense Auth. Provider   promethazine-dextromethorphan (PROMETHAZINE-DM) 6.25-15 MG/5ML syrup Take 5 mLs by mouth 4 (four) times daily as needed for cough. 118 mL Lakeeta Dobosz-Warren, Alda Lea, NP   predniSONE (DELTASONE) 20 MG tablet Take 2 tablets (40 mg total) by mouth daily with  breakfast for 5 days. 10 tablet Kristyna Bradstreet-Warren, Alda Lea, NP      PDMP not reviewed this encounter.

## 2022-04-08 NOTE — Discharge Instructions (Addendum)
The rapid strep test and flu test were negative.  A COVID test is pending.  You will be contacted if the COVID test is positive.  You are a candidate to receive next livid if your COVID test is positive.  Take medication as prescribed.  Increase fluids and allow for plenty of rest.  Continue over-the-counter Tylenol or ibuprofen as needed for pain, fever, or general discomfort.  For control of your fever, recommend medication every 8 hours.  For the cough, recommend use of a humidifier in your bedroom at nighttime during sleep and sleeping elevated on pillows while cough symptoms persist.  Warm salt water gargles as needed for throat pain or discomfort. It appears you most likely have a viral infection.  Please be advised that overall infection can last anywhere from 10 to 14 days.  If you experience sudden worsening before that time, or symptoms extend beyond that time, please follow-up with your primary care physician for further evaluation.  If you develop shortness of breath, difficulty breathing, or other concerns, please follow-up in the emergency department immediately for further evaluation.  Follow-up as needed.

## 2022-04-09 LAB — SARS CORONAVIRUS 2 (TAT 6-24 HRS): SARS Coronavirus 2: NEGATIVE

## 2022-11-13 LAB — COLOGUARD: COLOGUARD: NEGATIVE

## 2023-04-08 IMAGING — MR MR ABDOMEN WO/W CM MRCP
14 of 21 series · 26 of 48 positions shown · IV contrast (16ml Multihance)
Comparison: None.

CLINICAL DATA: Gallstones.  Abdominal discomfort.

EXAM:
MRI ABDOMEN WITHOUT AND WITH CONTRAST (INCLUDING MRCP)
TECHNIQUE: Multiplanar multisequence MR imaging of the abdomen was performed
both before and after the administration of intravenous contrast.
Heavily T2-weighted images of the biliary and pancreatic ducts were
obtained, and three-dimensional MRCP images were rendered by post
processing.
CONTRAST:  16mL MULTIHANCE GADOBENATE DIMEGLUMINE 529 MG/ML IV SOLN

[Series 4: cor haste · coronal · 5.0mm · 0.74mm/px · 2 of 36 slices shown]
[im 1/36]
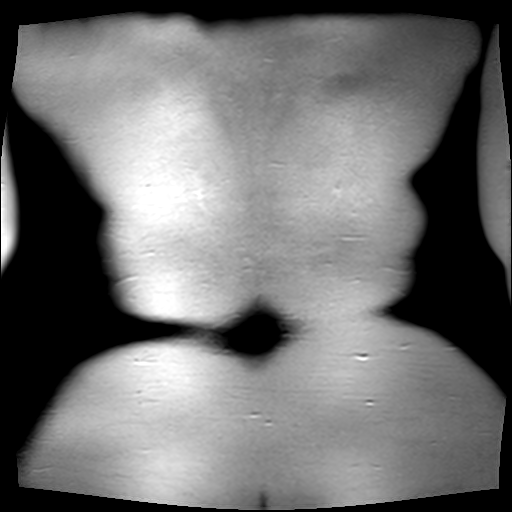
[im 36/36]
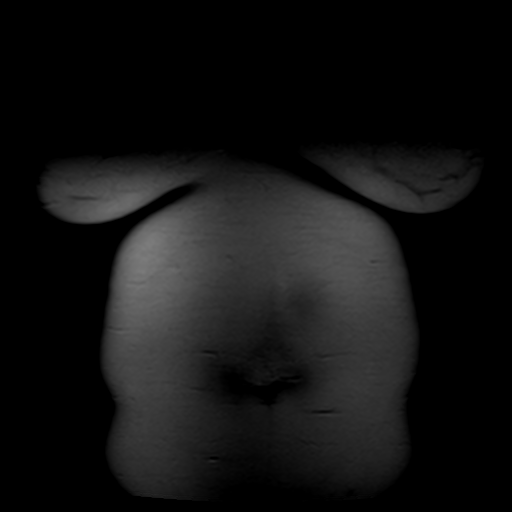

[Series 5: axial haste · axial · 6.0mm · 0.78mm/px · 1 of 35 slices shown]
[im 1/35]
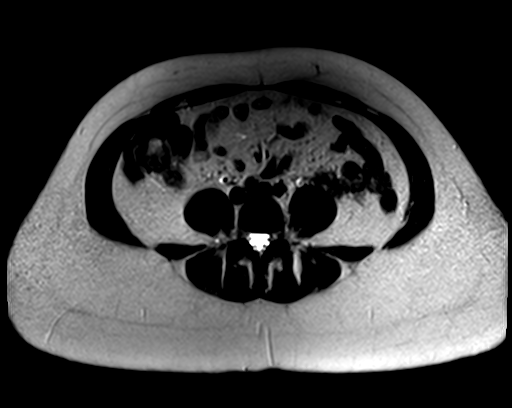

[Series 6: T1 · axial · 6.0mm · 0.74mm/px · z∈[-47,+157]mm · 2 of 64 slices shown]
[im 1/64]
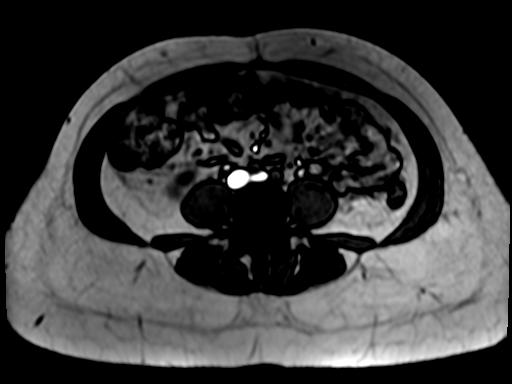
[im 64/64]
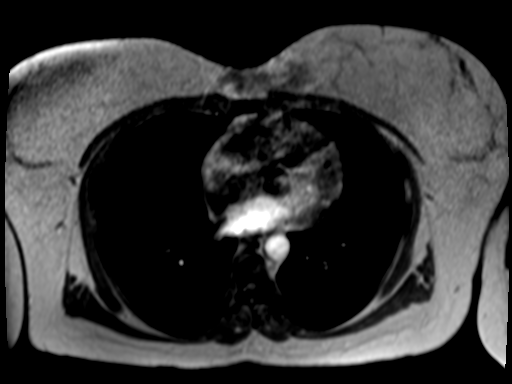

[Series 11: MRCP · sagittal · 0.99mm/px · 1 of 17 slices shown]
[im 1/17]
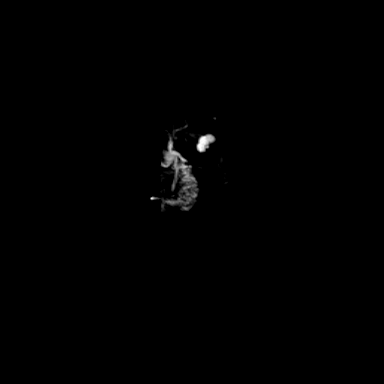

[Series 13: T2 · axial · 6.0mm · 1.12mm/px · 1 of 34 slices shown (1 of 2)]
[im 1/34]
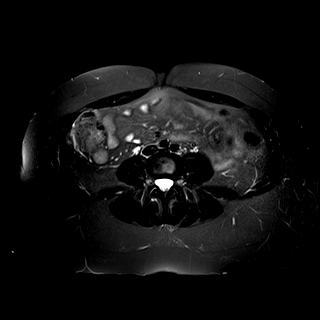

[Series 14: [id number]_trig · axial · 6.0mm · 1.98mm/px · z∈[-21,+188]mm · 3 of 90 slices shown]
[im 1/90]
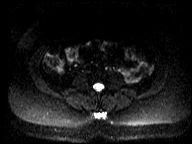
[im 45/90]
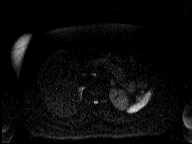
[im 90/90]
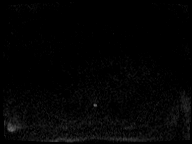

[Series 15: [id number] · axial · 6.0mm · 1.98mm/px · 1 of 30 slices shown]
[im 1/30]
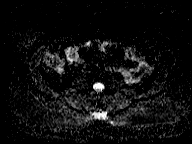

[Series 16: bSSFP · coronal · 5.0mm · 0.78mm/px · 1 of 32 slices shown (1 of 2)]
[im 1/32]
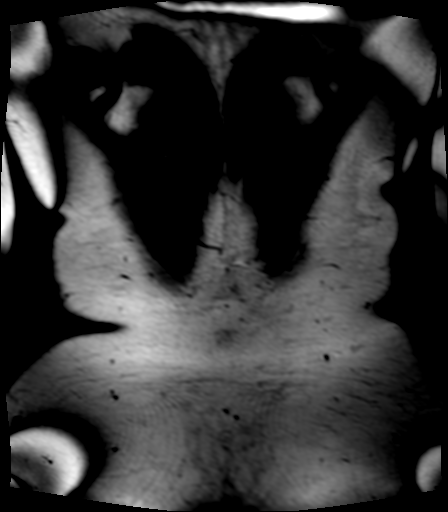

[Series 17: T2 · coronal · 3.0mm · 0.70mm/px · 2 of 53 slices shown (2 of 2)]
[im 1/53]
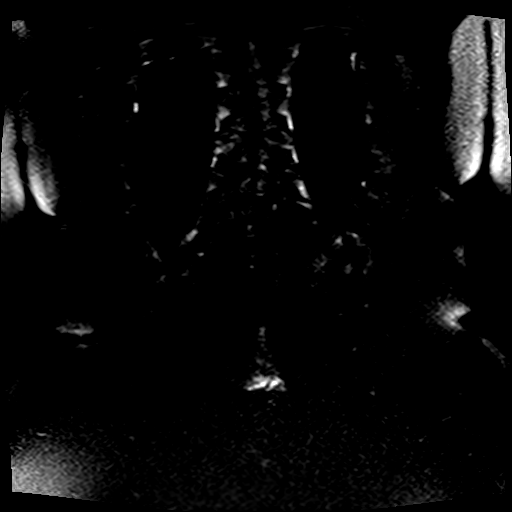
[im 53/53]
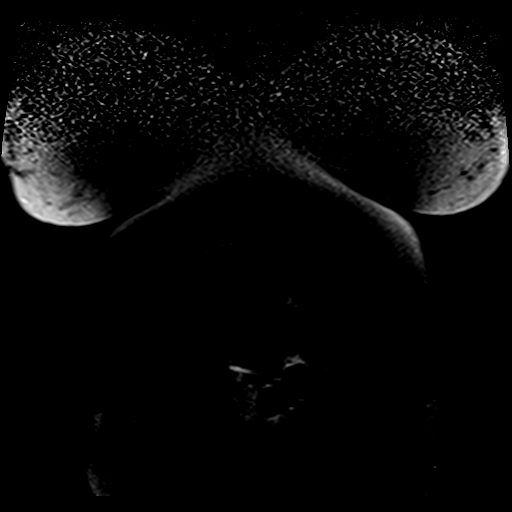

[Series 18: bSSFP · axial · 4.0mm · 0.68mm/px · z∈[-56,+164]mm · 2 of 56 slices shown (2 of 2)]
[im 1/56]
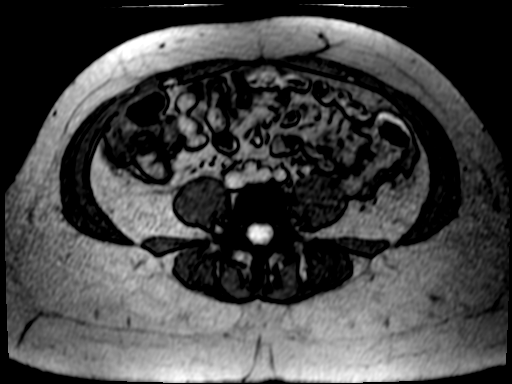
[im 56/56]
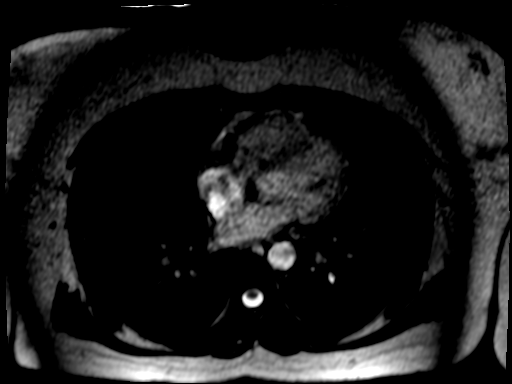

[Series 19: T1 dynamic · axial · non-contrast · 2.5mm · 0.74mm/px · z∈[-45,+153]mm · 3 of 80 slices shown]
[im 1/80]
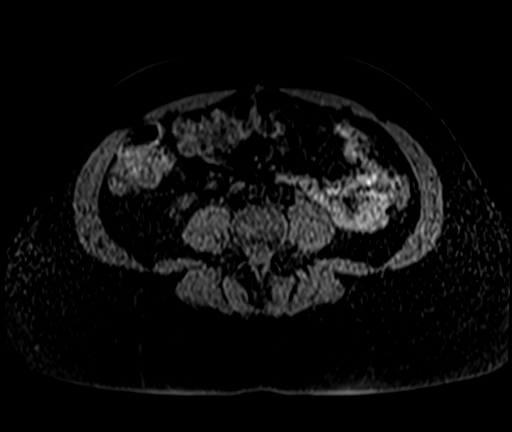
[im 40/80]
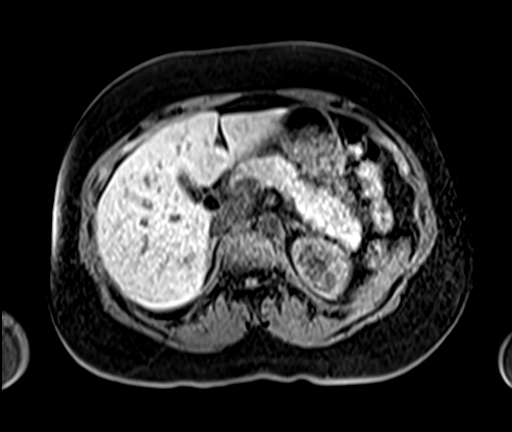
[im 80/80]
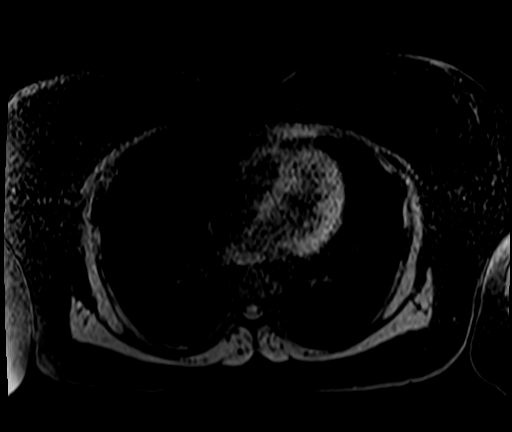

[Series 20: T1 dynamic post-contrast · axial · 2.5mm · 0.74mm/px · z∈[-45,+153]mm · 3 of 80 slices shown (1 of 3)]
[im 1/80]
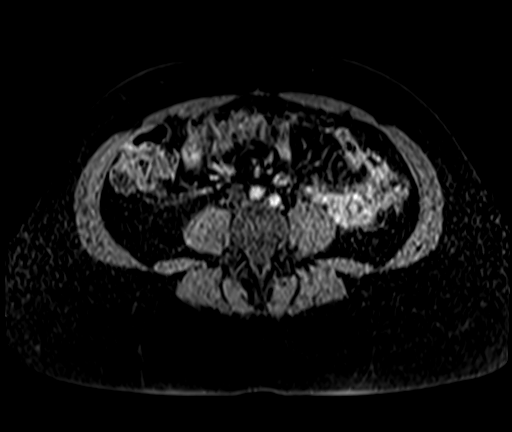
[im 40/80]
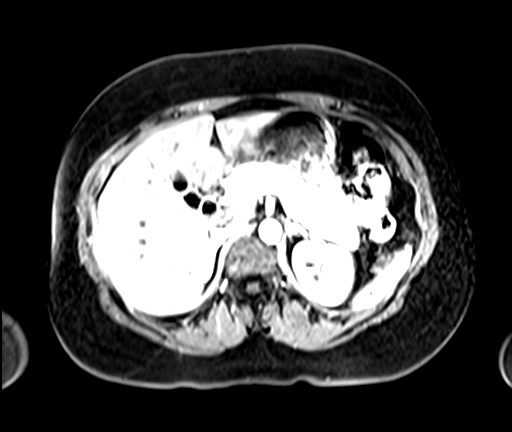
[im 80/80]
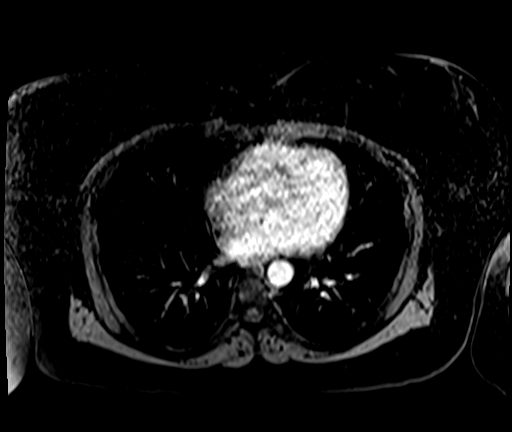

[Series 21: T1 dynamic post-contrast · axial · 2.5mm · 0.74mm/px · z∈[-45,+153]mm · 3 of 80 slices shown (2 of 3)]
[im 1/80]
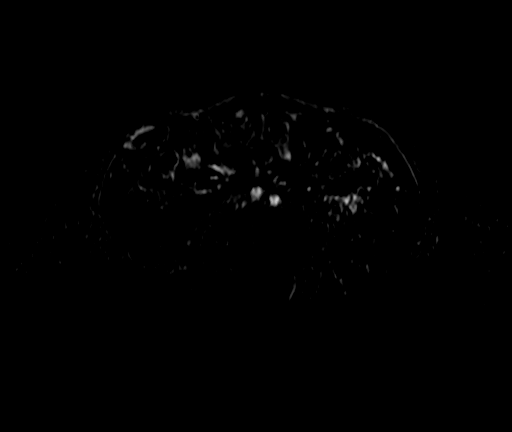
[im 40/80]
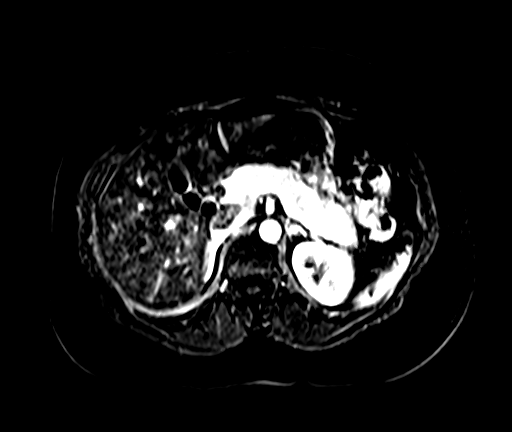
[im 80/80]
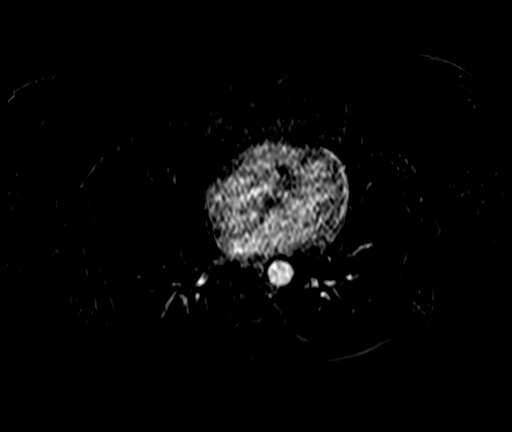

[Series 22: T1 dynamic post-contrast · axial · 2.5mm · 0.74mm/px · 1 of 80 slices shown (3 of 3)]
[im 1/80]
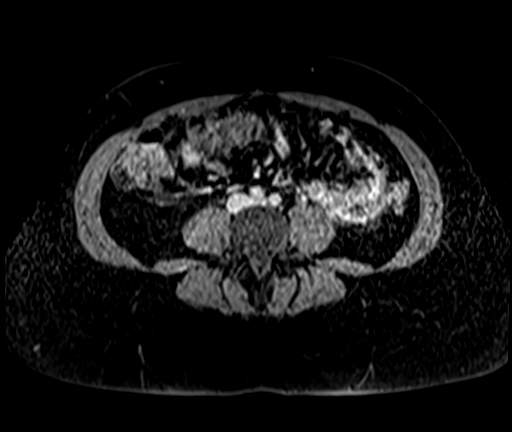

[26 of 48 positions shown; findings below may reference images not displayed]

FINDINGS: Lower chest: No acute findings.

Hepatobiliary: Several cysts noted within both lobes of liver. The
largest is in the anterior aspect of segment 8 measuring 1.2 cm,
image 16/13 no suspicious enhancing liver lesions.

Gallbladder is filled with multiple stones of varying sizes. The
largest stone is in the neck of the gallbladder measures 1 cm, image
18/13. No signs of gallbladder wall thickening or pericholecystic
inflammation.

Normal caliber of the intrahepatic bile ducts and common bile duct.
The CBD measures up to 4 mm. No choledocholithiasis.

Pancreas: No mass, inflammatory changes, or other parenchymal
abnormality identified.

Spleen:  Within normal limits in size and appearance.

Adrenals/Urinary Tract: No masses identified. No evidence of
hydronephrosis.

Stomach/Bowel: Visualized portions within the abdomen are
unremarkable.

Vascular/Lymphatic: No pathologically enlarged lymph nodes
identified. No abdominal aortic aneurysm demonstrated.

Other: Small fat containing umbilical hernia. No free fluid or fluid
collections.

Musculoskeletal: No suspicious bone lesions identified.
IMPRESSION: 1. Gallstones. No evidence for choledocholithiasis or biliary
dilatation.
2. Hepatic cysts.

## 2024-02-03 ENCOUNTER — Other Ambulatory Visit: Payer: Self-pay | Admitting: Obstetrics and Gynecology

## 2024-02-03 DIAGNOSIS — Z1231 Encounter for screening mammogram for malignant neoplasm of breast: Secondary | ICD-10-CM

## 2024-03-01 ENCOUNTER — Ambulatory Visit
Admission: RE | Admit: 2024-03-01 | Discharge: 2024-03-01 | Disposition: A | Discharge status: Home / Self Care | Source: Ambulatory Visit | Attending: Obstetrics and Gynecology | Admitting: Obstetrics and Gynecology

## 2024-03-01 DIAGNOSIS — Z1231 Encounter for screening mammogram for malignant neoplasm of breast: Secondary | ICD-10-CM
